# Patient Record
Sex: Female | Born: 1955 | Race: Black or African American | Hispanic: No | Smoking: Never smoker
Health system: Southern US, Community
[De-identification: ages and names within clinical notes are randomized; demographics above are authoritative.]

## PROBLEM LIST (undated history)

## (undated) DIAGNOSIS — E785 Hyperlipidemia, unspecified: Secondary | ICD-10-CM

## (undated) DIAGNOSIS — I10 Essential (primary) hypertension: Secondary | ICD-10-CM

## (undated) DIAGNOSIS — F32A Depression, unspecified: Secondary | ICD-10-CM

## (undated) DIAGNOSIS — G309 Alzheimer's disease, unspecified: Principal | ICD-10-CM

## (undated) DIAGNOSIS — R569 Unspecified convulsions: Secondary | ICD-10-CM

## (undated) DIAGNOSIS — D649 Anemia, unspecified: Secondary | ICD-10-CM

## (undated) DIAGNOSIS — E440 Moderate protein-calorie malnutrition: Secondary | ICD-10-CM

## (undated) DIAGNOSIS — S42301A Unspecified fracture of shaft of humerus, right arm, initial encounter for closed fracture: Secondary | ICD-10-CM

## (undated) DIAGNOSIS — F0390 Unspecified dementia without behavioral disturbance: Secondary | ICD-10-CM

## (undated) DIAGNOSIS — F0280 Dementia in other diseases classified elsewhere without behavioral disturbance: Secondary | ICD-10-CM

## (undated) DIAGNOSIS — N183 Chronic kidney disease, stage 3 (moderate): Secondary | ICD-10-CM

## (undated) DIAGNOSIS — D696 Thrombocytopenia, unspecified: Secondary | ICD-10-CM

## (undated) DIAGNOSIS — R4701 Aphasia: Secondary | ICD-10-CM

## (undated) DIAGNOSIS — F329 Major depressive disorder, single episode, unspecified: Secondary | ICD-10-CM

## (undated) HISTORY — PX: DILATION AND CURETTAGE OF UTERUS: SHX78

## (undated) HISTORY — DX: Unspecified convulsions: R56.9

## (undated) HISTORY — PX: VAGINAL HYSTERECTOMY: SUR661

## (undated) HISTORY — DX: Dementia in other diseases classified elsewhere without behavioral disturbance: F02.80

## (undated) HISTORY — DX: Alzheimer's disease, unspecified: G30.9

---

## 1997-10-18 ENCOUNTER — Ambulatory Visit (HOSPITAL_COMMUNITY): Admission: RE | Admit: 1997-10-18 | Discharge: 1997-10-18 | Payer: Self-pay | Admitting: Obstetrics and Gynecology

## 1998-08-29 ENCOUNTER — Encounter: Payer: Self-pay | Admitting: Obstetrics and Gynecology

## 1998-08-29 ENCOUNTER — Ambulatory Visit (HOSPITAL_COMMUNITY): Admission: RE | Admit: 1998-08-29 | Discharge: 1998-08-29 | Payer: Self-pay | Admitting: Obstetrics and Gynecology

## 1998-08-29 ENCOUNTER — Other Ambulatory Visit: Admission: RE | Admit: 1998-08-29 | Discharge: 1998-08-29 | Payer: Self-pay | Admitting: Obstetrics and Gynecology

## 1999-04-13 ENCOUNTER — Encounter: Payer: Self-pay | Admitting: Emergency Medicine

## 1999-04-13 ENCOUNTER — Emergency Department (HOSPITAL_COMMUNITY): Admission: EM | Admit: 1999-04-13 | Discharge: 1999-04-13 | Payer: Self-pay | Admitting: Emergency Medicine

## 1999-09-11 ENCOUNTER — Other Ambulatory Visit: Admission: RE | Admit: 1999-09-11 | Discharge: 1999-09-11 | Payer: Self-pay | Admitting: Obstetrics & Gynecology

## 1999-09-11 ENCOUNTER — Encounter: Payer: Self-pay | Admitting: Obstetrics and Gynecology

## 1999-09-11 ENCOUNTER — Ambulatory Visit (HOSPITAL_COMMUNITY): Admission: RE | Admit: 1999-09-11 | Discharge: 1999-09-11 | Payer: Self-pay | Admitting: Obstetrics and Gynecology

## 1999-09-21 ENCOUNTER — Encounter: Payer: Self-pay | Admitting: Obstetrics & Gynecology

## 1999-09-21 ENCOUNTER — Ambulatory Visit (HOSPITAL_COMMUNITY): Admission: RE | Admit: 1999-09-21 | Discharge: 1999-09-21 | Payer: Self-pay | Admitting: Psychology

## 1999-12-12 ENCOUNTER — Other Ambulatory Visit: Admission: RE | Admit: 1999-12-12 | Discharge: 1999-12-12 | Payer: Self-pay | Admitting: *Deleted

## 1999-12-12 ENCOUNTER — Encounter (INDEPENDENT_AMBULATORY_CARE_PROVIDER_SITE_OTHER): Payer: Self-pay

## 2000-01-11 ENCOUNTER — Ambulatory Visit (HOSPITAL_COMMUNITY): Admission: RE | Admit: 2000-01-11 | Discharge: 2000-01-11 | Payer: Self-pay | Admitting: Infectious Diseases

## 2000-01-11 ENCOUNTER — Encounter: Payer: Self-pay | Admitting: Infectious Diseases

## 2000-02-01 ENCOUNTER — Encounter (INDEPENDENT_AMBULATORY_CARE_PROVIDER_SITE_OTHER): Payer: Self-pay

## 2000-02-01 ENCOUNTER — Inpatient Hospital Stay (HOSPITAL_COMMUNITY): Admission: RE | Admit: 2000-02-01 | Discharge: 2000-02-04 | Payer: Self-pay | Admitting: Obstetrics & Gynecology

## 2000-09-13 ENCOUNTER — Encounter: Payer: Self-pay | Admitting: Obstetrics and Gynecology

## 2000-09-13 ENCOUNTER — Ambulatory Visit (HOSPITAL_COMMUNITY): Admission: RE | Admit: 2000-09-13 | Discharge: 2000-09-13 | Payer: Self-pay | Admitting: Obstetrics and Gynecology

## 2000-09-13 ENCOUNTER — Other Ambulatory Visit: Admission: RE | Admit: 2000-09-13 | Discharge: 2000-09-13 | Payer: Self-pay | Admitting: Obstetrics and Gynecology

## 2001-09-15 ENCOUNTER — Encounter: Payer: Self-pay | Admitting: Obstetrics and Gynecology

## 2001-09-15 ENCOUNTER — Ambulatory Visit (HOSPITAL_COMMUNITY): Admission: RE | Admit: 2001-09-15 | Discharge: 2001-09-15 | Payer: Self-pay | Admitting: Obstetrics and Gynecology

## 2001-09-15 ENCOUNTER — Other Ambulatory Visit: Admission: RE | Admit: 2001-09-15 | Discharge: 2001-09-15 | Payer: Self-pay | Admitting: Obstetrics and Gynecology

## 2002-09-16 ENCOUNTER — Other Ambulatory Visit: Admission: RE | Admit: 2002-09-16 | Discharge: 2002-09-16 | Payer: Self-pay | Admitting: Obstetrics and Gynecology

## 2002-09-17 ENCOUNTER — Ambulatory Visit (HOSPITAL_COMMUNITY): Admission: RE | Admit: 2002-09-17 | Discharge: 2002-09-17 | Payer: Self-pay | Admitting: Obstetrics and Gynecology

## 2002-09-17 ENCOUNTER — Encounter: Payer: Self-pay | Admitting: Obstetrics and Gynecology

## 2002-12-24 ENCOUNTER — Emergency Department (HOSPITAL_COMMUNITY): Admission: EM | Admit: 2002-12-24 | Discharge: 2002-12-24 | Payer: Self-pay | Admitting: *Deleted

## 2002-12-24 ENCOUNTER — Encounter: Payer: Self-pay | Admitting: *Deleted

## 2003-09-20 ENCOUNTER — Other Ambulatory Visit: Admission: RE | Admit: 2003-09-20 | Discharge: 2003-09-20 | Payer: Self-pay | Admitting: Obstetrics and Gynecology

## 2003-09-20 ENCOUNTER — Ambulatory Visit (HOSPITAL_COMMUNITY): Admission: RE | Admit: 2003-09-20 | Discharge: 2003-09-20 | Payer: Self-pay | Admitting: Obstetrics and Gynecology

## 2003-10-14 ENCOUNTER — Ambulatory Visit (HOSPITAL_COMMUNITY): Admission: RE | Admit: 2003-10-14 | Discharge: 2003-10-14 | Payer: Self-pay | Admitting: Obstetrics and Gynecology

## 2003-10-14 ENCOUNTER — Encounter (INDEPENDENT_AMBULATORY_CARE_PROVIDER_SITE_OTHER): Payer: Self-pay | Admitting: *Deleted

## 2004-03-06 ENCOUNTER — Emergency Department (HOSPITAL_COMMUNITY): Admission: EM | Admit: 2004-03-06 | Discharge: 2004-03-06 | Payer: Self-pay

## 2004-05-31 ENCOUNTER — Other Ambulatory Visit: Admission: RE | Admit: 2004-05-31 | Discharge: 2004-05-31 | Payer: Self-pay | Admitting: Obstetrics and Gynecology

## 2004-09-21 ENCOUNTER — Ambulatory Visit (HOSPITAL_COMMUNITY): Admission: RE | Admit: 2004-09-21 | Discharge: 2004-09-21 | Payer: Self-pay | Admitting: Obstetrics and Gynecology

## 2004-11-29 ENCOUNTER — Other Ambulatory Visit: Admission: RE | Admit: 2004-11-29 | Discharge: 2004-11-29 | Payer: Self-pay | Admitting: Obstetrics and Gynecology

## 2005-06-28 ENCOUNTER — Other Ambulatory Visit: Admission: RE | Admit: 2005-06-28 | Discharge: 2005-06-28 | Payer: Self-pay | Admitting: Obstetrics and Gynecology

## 2005-11-16 ENCOUNTER — Ambulatory Visit (HOSPITAL_COMMUNITY): Admission: RE | Admit: 2005-11-16 | Discharge: 2005-11-16 | Payer: Self-pay | Admitting: Obstetrics and Gynecology

## 2005-12-10 ENCOUNTER — Ambulatory Visit (HOSPITAL_COMMUNITY): Admission: RE | Admit: 2005-12-10 | Discharge: 2005-12-10 | Payer: Self-pay | Admitting: Gastroenterology

## 2005-12-10 ENCOUNTER — Encounter (INDEPENDENT_AMBULATORY_CARE_PROVIDER_SITE_OTHER): Payer: Self-pay | Admitting: *Deleted

## 2006-01-17 ENCOUNTER — Other Ambulatory Visit: Admission: RE | Admit: 2006-01-17 | Discharge: 2006-01-17 | Payer: Self-pay | Admitting: Obstetrics and Gynecology

## 2007-12-31 ENCOUNTER — Ambulatory Visit (HOSPITAL_COMMUNITY): Admission: RE | Admit: 2007-12-31 | Discharge: 2007-12-31 | Payer: Self-pay | Admitting: Family Medicine

## 2008-01-02 ENCOUNTER — Ambulatory Visit (HOSPITAL_COMMUNITY): Admission: RE | Admit: 2008-01-02 | Discharge: 2008-01-02 | Payer: Self-pay | Admitting: Obstetrics and Gynecology

## 2008-02-27 ENCOUNTER — Ambulatory Visit (HOSPITAL_COMMUNITY): Admission: RE | Admit: 2008-02-27 | Discharge: 2008-02-27 | Payer: Self-pay | Admitting: Neurology

## 2008-04-19 ENCOUNTER — Encounter: Admission: RE | Admit: 2008-04-19 | Discharge: 2008-05-25 | Payer: Self-pay | Admitting: Neurology

## 2008-09-09 ENCOUNTER — Encounter: Admission: RE | Admit: 2008-09-09 | Discharge: 2008-09-09 | Payer: Self-pay | Admitting: Neurology

## 2008-11-03 ENCOUNTER — Ambulatory Visit (HOSPITAL_COMMUNITY): Admission: RE | Admit: 2008-11-03 | Discharge: 2008-11-03 | Payer: Self-pay | Admitting: Psychiatry

## 2008-11-17 ENCOUNTER — Ambulatory Visit (HOSPITAL_COMMUNITY): Admission: RE | Admit: 2008-11-17 | Discharge: 2008-11-17 | Payer: Self-pay | Admitting: Neurology

## 2009-07-05 ENCOUNTER — Ambulatory Visit (HOSPITAL_COMMUNITY): Admission: RE | Admit: 2009-07-05 | Discharge: 2009-07-05 | Payer: Self-pay | Admitting: Gastroenterology

## 2010-06-18 ENCOUNTER — Encounter: Payer: Self-pay | Admitting: Obstetrics and Gynecology

## 2010-06-18 ENCOUNTER — Encounter: Payer: Self-pay | Admitting: Endocrinology

## 2010-09-04 LAB — GLUCOSE, CAPILLARY: Glucose-Capillary: 124 mg/dL — ABNORMAL HIGH (ref 70–99)

## 2010-09-04 LAB — CREATININE, SERUM
Creatinine, Ser: 1.23 mg/dL — ABNORMAL HIGH (ref 0.4–1.2)
GFR calc non Af Amer: 46 mL/min — ABNORMAL LOW

## 2010-09-04 LAB — BUN: BUN: 13 mg/dL (ref 6–23)

## 2010-10-13 NOTE — H&P (Signed)
NAME:  Alexandria Lowe, Alexandria Lowe                           ACCOUNT NO.:  [id number]   MEDICAL RECORD NO.:  [id number]                   PATIENT TYPE:  AMB   LOCATION:  SDC                                  FACILITY:  [facility]   PHYSICIAN:  Janine Limbo, M.D.            DATE OF BIRTH:  1955/11/10   DATE OF ADMISSION:  10/14/2003  DATE OF DISCHARGE:                                HISTORY & PHYSICAL   HISTORY OF PRESENT ILLNESS:  Alexandria Lowe is a 55 year old female, para 1-0-2-1,  who presents with a Pap smear from April of 2005 that showed a high-grade  squamous intraepithelial lesion.  Colposcopically directed biopsies were  performed in May of 2005 and the patient had a transition zone that could  not be completely identified.  A biopsy of the exocervix showed squamous  metaplasia only.  The patient had a supracervical hysterectomy performed in  2001 because of fibroids, menorrhagia, and anemia.  The patient had personal  reasons for wanting to keep her cervix.  The patient had a dilatation and  curettage for a miscarriage in 1974.   DRUG ALLERGIES:  No known drug allergies.   PAST MEDICAL HISTORY:  The patient has had a positive PPD with a negative  chest x-ray.  The patient has a history of hypertension and obesity.   SOCIAL HISTORY:  The patient denies cigarette use, alcohol use, and  recreational drug use.   REVIEW OF SYSTEMS:  Noncontributory.   FAMILY HISTORY:  The patient's mother had lung cancer, heart disease, and  diabetes.  She has a sister with hypothyroidism and hypertension.  She has a  brother with diabetes.   PHYSICAL EXAMINATION:  WEIGHT:  250 pounds.  HEIGHT:  5 feet 3 inches.  HEENT:  Within normal limits.  CHEST:  Clear.  HEART:  Regular rate and rhythm.  BREASTS:  Without masses.  ABDOMEN:  Nontender.  EXTREMITIES:  Grossly normal.  NEUROLOGIC:  Grossly normal.  PELVIC:  External genitalia are normal.  The vagina is normal.  The cervix  is nontender.  The uterus is  surgically absent.  Adnexa:  No masses are  appreciated.  Rectovaginal exam confirms.   ASSESSMENT:  1. High-grade squamous intraepithelial lesion on Pap smear with an     inadequate colposcopy because the transition zone could not be completely     seen.  2. The patient is status post supracervical hysterectomy.  3. Hypertension.  4. Obesity.   PLAN:  The patient will undergo a cold knife conization of the cervix.  She  understands the indications for her procedure and she accepts the risks of,  but not limited to anesthetic complications, bleeding, infections, and  possible damage to the surrounding organs.  Janine Limbo, M.D.    AVS/MEDQ  D:  10/10/2003  T:  10/10/2003  Job:  [id number]

## 2010-10-13 NOTE — Op Note (Signed)
NAME:  Alexandria Lowe, Alexandria Lowe                           ACCOUNT NO.:  [id number]   MEDICAL RECORD NO.:  [id number]                   PATIENT TYPE:  AMB   LOCATION:  SDC                                  FACILITY:  [facility]   PHYSICIAN:  Janine Limbo, M.D.            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   PROCEDURE:  Cold knife conization of the cervix.   SURGEON:  Janine Limbo, M.D.   ANESTHESIA:  Monitored anesthetic control, paracervical block using 0.5%  Marcaine with epinephrine.   DISPOSITION:  [name] is a 55 year old female who had a Pap smear that  showed a high grade lesion (cervical intraepithelial neoplasia II).  The  patient has had a supracervical hysterectomy in the past.  Colposcopy was  performed and the transition zone could not be completely seen.  Biopsies  were obtained but no pathology was found.  The patient understands the  indications for her conization of the cervix. She accepts the risk of, but  not limited to, anesthetic complications, bleeding, infections and possible  damage to the surrounding organs.   FINDINGS:  Lugol solution was placed on the cervix and there were no  nonstaining areas.  The vagina appeared normal.   DESCRIPTION OF PROCEDURE:  The patient was taken to the operating room where  she was given medication throughout her IV line.  She was placed in a  lithotomy position. The perineum and vagina were prepped with multiple  layers of Betadine. The bladder was drained of urine. The patient was  sterilely draped.  Examination under anesthesia was performed.  Lugol  solution was placed on the cervix and there were no nonstaining  areas noted.  Figure-of-eight sutures were placed at the 3 o'clock and the 9 o'clock  position on the cervix. The cervix was then injected with a diluted solution  of 0.5% Marcaine with epinephrine.  The endocervix was sounded.  A  circumferential incision was made around the endocervix and the incision was  extended in a cone like fashion toward the endocervical os.  The specimen  was opened at 9 o'clock and sent to pathology.  Hemostasis was achieved  using inverting sutures.  The inverting sutures were placed at the 12  o'clock, 3 o'clock, 6 o'clock and 9 o'clock positions.  Hemostasis was  thought to be adequate.  Her exam was repeated and the cervix was noted to  be normal. The patient was returned to the supine position. She was taken to  the recovery room in stable condition. #0 Vicryl was the suture material  used throughout the procedure.   FOLLOWUP INSTRUCTIONS:  The patient was given a prescription for Vicodin and  she will take 1 or 2 tablets every 4 hours as needed for pain.  She will  return to see Dr. Stefano Gaul in 2-3 weeks for followup examination.  She  was given a copy of the postoperative instruction sheet as prepared by the  [facility] for patients who have undergone the  dilatation and curettage. That instruction sheet was modified for a  conization of the cervix.                                               Janine Limbo, M.D.    AVS/MEDQ  D:  10/14/2003  T:  10/14/2003  Job:  [id number]

## 2010-10-13 NOTE — Op Note (Signed)
[facility]  Patient:    Alexandria Lowe, Alexandria Lowe                        MRN: [id number] Proc. Date: 02/01/00 Adm. Date:  [id number] Attending:  Cleatrice Burke                           Operative Report  PREOPERATIVE DIAGNOSES:       1. Menorrhagia.                               2. Fibroid uterus.                               3. Anemia.                               4. Desires to preserve the cervix.  POSTOPERATIVE DIAGNOSES:      1. Menorrhagia.                               2. Fibroid uterus.                               3. Anemia.                               4. Desires to preserve the cervix.  OPERATION:                    Abdominal supracervical hysterectomy.  SURGEON:                      Cecilio Asper, M.D.  ASSISTANT:                    Henreitta Leber, P.A.C.  ANESTHESIA:                   General anesthesia.  ESTIMATED BLOOD LOSS:         850 cc.  URINE OUTPUT:                 300 cc.  FINDINGS:                     Multiple fibroids within the uterus.  Normal                               tubes and ovaries.  COMPLICATIONS:                None.  INDICATIONS:                  The patient is a 55 year old gravida 3, para 1-0-2-3 who has a history of heavy menstrual periods.  The patient has known fibroid uterus and has been taking iron for iron-deficiency anemia.  The patient declined uterine artery embolization and desired definitive therapy. The patient desires to keep her cervix and therefore is consented for supracervical abdominal hysterectomy.  DESCRIPTION OF PROCEDURE:  After adequate level of general anesthesia was obtained, the patient was prepped in sterile fashion.  Foley catheter was placed inside the bladder to drain the urine and the patient was subsequently draped. A low transverse incision was made down through subcutaneous fat to the fascia. Fascia was incised on either side of the midline. This  incision was carried laterally in both directions with the Mayo scissors.  Kocher clamps were placed on the superior edge of the fascia and the rectus muscles were removed from the fascia both superiorly and inferiorly.  Subcuticular bleeders were made hemostatic with the Bovie.  Parietal peritoneum was identified and elevated with hemostats.  It was incised and the incision was carried superiorly and inferiorly with the Metzenbaum scissors.  The OConnor-OSullivan retractor was placed.  Bladder piece was placed. The bowel was packed and the patient was placed in Trendelenburg position.  Kelly clamps were placed on the utero-ovarian pedicles bilaterally. The round ligament was grasped, suture ligated and transected.  A window within the broad ligament was made and the [facility] were placed in order to clamp the utero-ovarian pedicles. This pedicle was then cut and made hemostatic with a free tie ligature and a suture ligature bilaterally. It was difficult to develop the bladder flap because there was an anterior fibroid noted.  This fibroid was injected with a dilute solution of Pitressin.  With sharp and blunt dissection as well as a towel clip, this large fibroid was removed.  Subsequently the bladder flap could be identified and was developed in normal fashion.  The uterine arteries were then crossclamped, cut and suture ligated bilaterally. The fundus of the uterus superior to the myomectomy was then removed.  It was noted that there were still a few branches of the uterine arteries that were identifiable. These were crossclamped, cut, and suture ligated.  Hemostasis at that point was assured.  The rest of the uterus was removed above the uterosacral cardinal ligament complex. The stump of the cervix was oversewn. A few bleeders on the infundibulopelvic ligament was made hemostatic with a figure-of-eight of 0 Vicryl. There was some bleeding from the bladder flap that was made hemostatic  with the Bovie. The pelvis was then copiously irrigated.  Hemostasis was assured. Packs were then removed.  Round ligament pedicles were cut.  OConnor-OSullivan retractor was then removed.  Parietal peritoneum was grasped with Kelly clamps and reapproximated with a running suture of 3-0 Vicryl.  Subfascial bleeders were made hemostatic with the Bovie.  The fascia was then reapproximated from the lateral edge to the midline with a running suture of #1 Vicryl.  Subcuticular layer was irrigated. Skin was closed with skin staples.  Pressure bandage was applied.  The patient tolerated the procedure well. She was taken to the recovery room in stable condition. DD:  02/01/00 TD:  02/02/00 Job: [id number]

## 2010-10-13 NOTE — Discharge Summary (Signed)
[facility]  Patient:    Alexandria Lowe, Alexandria Lowe                        MRN: [id number] Adm. Date:  [id number] Disc. Date: [id number] Attending:  Cleatrice Burke Dictator:   Marquis Lunch. Lowell Guitar, PA-C                           Discharge Summary  DISCHARGE DIAGNOSES:          1. Menorrhagia.                               2. Fibroid uterus.                               3. Anemia.                               4. Desire to preserve cervix.  PROCEDURE:                    On date of admission patient underwent an abdominal supracervical hysterectomy.  HISTORY OF PRESENT ILLNESS:   This is a 55 year old African-American female gravida 3, para 1-0-2-3 with a history of uterine fibroids and very heavy menstrual periods which have caused significant anemia.  Patient presents for definitive therapy of her symptoms in the form of a supracervical hysterectomy.  Please see patients dictated history and physical examination for details.  PHYSICAL EXAMINATION:  VITAL SIGNS:                  Blood pressure 120/80.  Weight 249 pounds.  GENERAL:                      Within normal limits.  PELVIC:                       EGBUS reveals a normal female genitalia.  Vagina is without discharge.  Cervix without gross lesions.  Uterus is upper limits of normal, however, difficult to assess secondary to body habitus.  Adnexa also limited by body habitus, however, no palpable masses.  Rectovaginal was deferred.  HOSPITAL COURSE:              On date of admission patient underwent an abdominal supracervical hysterectomy, tolerating procedure well.  Her postoperative course was unremarkable with patient quickly tolerating a regular diet and deemed ready for discharge on postoperative day #3. Postoperative hemoglobin 8.3.  Preoperative hemoglobin 11.3.  DISCHARGE MEDICATIONS:        1. Darvocet-N 100 one to two tablets every three                                  to four hours as  needed for pain.                               2. Ibuprofen 600 mg every six hours as needed.  3. Iron one tablet t.i.d.  FOLLOW-UP:                    Patient is scheduled to present to [facility] February 08, 2000 for staple removal.  She is at that time to schedule a six week postoperative appointment with Dr. Cleatrice Burke.  DISCHARGE INSTRUCTIONS:       Patient was given a copy of [facility] postoperative instruction sheet.  She was further advised to avoid driving for two weeks, heavy lifting for four weeks, and intercourse for six weeks.  FINAL PATHOLOGY:              Uterus:  Benign secretory endometrium and leiomyomata uteri submucosal, intramural, and subserosal. DD:  03/06/00 TD:  03/07/00 Job: [id number]

## 2010-10-13 NOTE — Op Note (Signed)
Alexandria Lowe, Alexandria Lowe                 ACCOUNT NO.:  [id number]   MEDICAL RECORD NO.:  [id number]          PATIENT TYPE:  AMB   LOCATION:  ENDO                         FACILITY:  [facility]   PHYSICIAN:  Bernette Redbird, M.D.   DATE OF BIRTH:  1956-04-05   DATE OF PROCEDURE:  12/10/2005  DATE OF DISCHARGE:                                 OPERATIVE REPORT   PROCEDURE:  Colonoscopy with polypectomy.   INDICATIONS:  A 55 year old female for initial colon cancer screening  procedure.  No worrisome risk factors or symptoms, although in the past she  has had anemia.   FINDINGS:  A 1 cm semipedunculated polyp removed from the proximal ascending  colon.   DESCRIPTION OF PROCEDURE:  The nature, purpose, and risks of the procedure  had been discussed with the patient via our open access program through the  office and I reviewed the risks of the procedure with her.  She had provided  written consent after coming, as an outpatient, to the endoscopy unit.  Sedation:  Fentanyl 100 mcg and Versed 7.5 mg IV without arrhythmias or  desaturation.    The Olympus adult video colonoscope was advanced to the terminal ileum  without difficulty and pullback was then performed.  The terminal ileum had  normal appearance, and the appendiceal orifice was well identified.  A short  distance above the cecum was roughly a 1 cm semipedunculated polyp which was  snared off, using cautery, in two pieces.  Although the polyp, itself,  seemed somewhat friable and vascular, there was no evident bleeding  following the polypectomy.  The pieces were successfully suctioned through  the scope.   No other polyps were seen on this exam and there was no evidence of cancer,  colitis or vascular malformations.  A solitary right side small diverticulum  was seen during the exam.  Retroflexion was attempted in the rectum, but  could not be accomplished due to a small rectal ampulla, but careful  antegrade viewing disclosed no  distal rectal lesions; and reinspection of  the rectum was unremarkable.  The patient tolerated the procedure well and  there no apparent complications.   IMPRESSION:  1.  Colon polyp removed as described above (211.3).  2.  Solitary right side diverticulum.   PLAN:  Await pathology results.           ______________________________  Bernette Redbird, M.D.     RB/MEDQ  D:  12/10/2005  T:  12/10/2005  Job:  [id number]   cc:   Jethro Bastos, M.D.  Fax: [phone]

## 2013-03-04 ENCOUNTER — Telehealth: Payer: Self-pay | Admitting: Neurology

## 2013-03-06 MED ORDER — MEMANTINE HCL 10 MG PO TABS: 10.0000 mg | ORAL_TABLET | Freq: Two times a day (BID) | ORAL | Status: DC

## 2013-03-06 MED ORDER — DONEPEZIL HCL 10 MG PO TABS: 10.0000 mg | ORAL_TABLET | Freq: Every day | ORAL | Status: DC

## 2013-03-06 MED ORDER — SERTRALINE HCL 50 MG PO TABS: 50.0000 mg | ORAL_TABLET | Freq: Every day | ORAL | Status: DC

## 2013-03-06 NOTE — Telephone Encounter (Signed)
Spoke to patient's son Cristal Deer. Patient is a formed Dr. Sandria Manly patient. Needs reassignment and refills. She is currently out of Aricept 10mg , Sertraline 50mg , and Namenda 10mg . Requesting to have refills called in today until patient can be transferred/scheduled to another MD in practice.

## 2013-03-10 ENCOUNTER — Emergency Department (HOSPITAL_COMMUNITY): Payer: Medicare HMO

## 2013-03-10 ENCOUNTER — Observation Stay (HOSPITAL_COMMUNITY)
Admission: EM | Admit: 2013-03-10 | Discharge: 2013-03-11 | Disposition: A | Discharge status: Home / Self Care | Payer: Medicare HMO | Attending: Family Medicine | Admitting: Family Medicine

## 2013-03-10 ENCOUNTER — Encounter (HOSPITAL_COMMUNITY): Payer: Self-pay | Admitting: Emergency Medicine

## 2013-03-10 DIAGNOSIS — I951 Orthostatic hypotension: Secondary | ICD-10-CM | POA: Insufficient documentation

## 2013-03-10 DIAGNOSIS — Z79899 Other long term (current) drug therapy: Secondary | ICD-10-CM | POA: Insufficient documentation

## 2013-03-10 DIAGNOSIS — R55 Syncope and collapse: Principal | ICD-10-CM | POA: Diagnosis present

## 2013-03-10 DIAGNOSIS — F0390 Unspecified dementia without behavioral disturbance: Secondary | ICD-10-CM | POA: Insufficient documentation

## 2013-03-10 DIAGNOSIS — R42 Dizziness and giddiness: Secondary | ICD-10-CM | POA: Insufficient documentation

## 2013-03-10 DIAGNOSIS — R9431 Abnormal electrocardiogram [ECG] [EKG]: Secondary | ICD-10-CM

## 2013-03-10 DIAGNOSIS — I4581 Long QT syndrome: Secondary | ICD-10-CM | POA: Insufficient documentation

## 2013-03-10 DIAGNOSIS — R011 Cardiac murmur, unspecified: Secondary | ICD-10-CM | POA: Insufficient documentation

## 2013-03-10 DIAGNOSIS — E876 Hypokalemia: Secondary | ICD-10-CM | POA: Diagnosis present

## 2013-03-10 DIAGNOSIS — Z23 Encounter for immunization: Secondary | ICD-10-CM | POA: Insufficient documentation

## 2013-03-10 DIAGNOSIS — I10 Essential (primary) hypertension: Secondary | ICD-10-CM | POA: Insufficient documentation

## 2013-03-10 DIAGNOSIS — I160 Hypertensive urgency: Secondary | ICD-10-CM | POA: Diagnosis present

## 2013-03-10 HISTORY — DX: Unspecified dementia, unspecified severity, without behavioral disturbance, psychotic disturbance, mood disturbance, and anxiety: F03.90

## 2013-03-10 HISTORY — DX: Essential (primary) hypertension: I10

## 2013-03-10 LAB — URINALYSIS, ROUTINE W REFLEX MICROSCOPIC
Bilirubin Urine: NEGATIVE
Glucose, UA: NEGATIVE mg/dL
Ketones, ur: 15 mg/dL — AB
Protein, ur: 30 mg/dL — AB
Specific Gravity, Urine: 1.022 (ref 1.005–1.030)
pH: 6.5 (ref 5.0–8.0)

## 2013-03-10 LAB — COMPREHENSIVE METABOLIC PANEL
ALT: 41 U/L — ABNORMAL HIGH (ref 0–35)
BUN: 11 mg/dL (ref 6–23)
CO2: 28 mEq/L (ref 19–32)
Calcium: 9.1 mg/dL (ref 8.4–10.5)
Chloride: 104 mEq/L (ref 96–112)
Creatinine, Ser: 1.15 mg/dL — ABNORMAL HIGH (ref 0.50–1.10)
GFR calc Af Amer: 60 mL/min — ABNORMAL LOW (ref >90)
GFR calc non Af Amer: 51 mL/min — ABNORMAL LOW (ref >90)
Glucose, Bld: 158 mg/dL — ABNORMAL HIGH (ref 70–99)
Total Bilirubin: 0.5 mg/dL (ref 0.3–1.2)

## 2013-03-10 LAB — CBC
Hemoglobin: 13.6 g/dL (ref 12.0–15.0)
MCH: 27.2 pg (ref 26.0–34.0)
MCHC: 34 g/dL (ref 30.0–36.0)
MCV: 80 fL (ref 78.0–100.0)
RBC: 5 MIL/uL (ref 3.87–5.11)
RDW: 15.8 % — ABNORMAL HIGH (ref 11.5–15.5)

## 2013-03-10 LAB — URINE MICROSCOPIC-ADD ON

## 2013-03-10 LAB — GLUCOSE, CAPILLARY: Glucose-Capillary: 145 mg/dL — ABNORMAL HIGH (ref 70–99)

## 2013-03-10 MED ORDER — HEPARIN SODIUM (PORCINE) 5000 UNIT/ML IJ SOLN
5000.0000 [IU] | Freq: Three times a day (TID) | INTRAMUSCULAR | Status: DC
  Administered 2013-03-10 – 2013-03-11 (×2): 5000 [IU] via SUBCUTANEOUS
  Filled 2013-03-10 (×5): qty 1

## 2013-03-10 MED ORDER — SODIUM CHLORIDE 0.9 % IV SOLN
INTRAVENOUS | Status: AC
  Administered 2013-03-10: 23:00:00 via INTRAVENOUS

## 2013-03-10 MED ORDER — AMLODIPINE BESYLATE 10 MG PO TABS
10.0000 mg | ORAL_TABLET | Freq: Every day | ORAL | Status: DC
  Administered 2013-03-10 – 2013-03-11 (×2): 10 mg via ORAL
  Filled 2013-03-10 (×2): qty 1

## 2013-03-10 MED ORDER — HYDRALAZINE HCL 20 MG/ML IJ SOLN
5.0000 mg | INTRAMUSCULAR | Status: DC | PRN
  Administered 2013-03-11: 5 mg via INTRAVENOUS
  Filled 2013-03-10: qty 1

## 2013-03-10 MED ORDER — SERTRALINE HCL 50 MG PO TABS
50.0000 mg | ORAL_TABLET | Freq: Every day | ORAL | Status: DC
Start: 2013-03-11 — End: 2013-03-12
  Administered 2013-03-11: 50 mg via ORAL
  Filled 2013-03-10: qty 1

## 2013-03-10 MED ORDER — SODIUM CHLORIDE 0.9 % IJ SOLN
3.0000 mL | Freq: Two times a day (BID) | INTRAMUSCULAR | Status: DC
  Administered 2013-03-10: 3 mL via INTRAVENOUS

## 2013-03-10 MED ORDER — MEMANTINE HCL 10 MG PO TABS
10.0000 mg | ORAL_TABLET | Freq: Two times a day (BID) | ORAL | Status: DC
  Administered 2013-03-10 – 2013-03-11 (×2): 10 mg via ORAL
  Filled 2013-03-10 (×3): qty 1

## 2013-03-10 MED ORDER — CLONIDINE HCL 0.1 MG PO TABS
0.1000 mg | ORAL_TABLET | Freq: Once | ORAL | Status: AC
  Administered 2013-03-10: 0.1 mg via ORAL
  Filled 2013-03-10: qty 1

## 2013-03-10 MED ORDER — POTASSIUM CHLORIDE CRYS ER 20 MEQ PO TBCR
40.0000 meq | EXTENDED_RELEASE_TABLET | Freq: Once | ORAL | Status: AC
  Administered 2013-03-10: 40 meq via ORAL
  Filled 2013-03-10: qty 2

## 2013-03-10 MED ORDER — DONEPEZIL HCL 10 MG PO TABS
10.0000 mg | ORAL_TABLET | Freq: Every day | ORAL | Status: DC
  Administered 2013-03-10: 10 mg via ORAL
  Filled 2013-03-10 (×2): qty 1

## 2013-03-10 MED ORDER — POTASSIUM CHLORIDE 10 MEQ/100ML IV SOLN
10.0000 meq | Freq: Once | INTRAVENOUS | Status: AC
  Administered 2013-03-10: 10 meq via INTRAVENOUS
  Filled 2013-03-10: qty 100

## 2013-03-10 NOTE — ED Notes (Signed)
MD at bedside. 

## 2013-03-10 NOTE — ED Notes (Signed)
Patient transported to X-ray 

## 2013-03-10 NOTE — ED Notes (Signed)
Pt found outside of ED. Returned to room. Safety zone completed. Pt no voiced complaints. Pt smiling, laughing, denies pain. NAD. Sitter requested by charge nurse

## 2013-03-10 NOTE — ED Provider Notes (Signed)
7:18 PM Have spoken to family medicine who will admit for further w/u of near syncopal episode of QT prolongation and uncontrolled hypertension.   1. Near syncope   2. Hypertension   3. Orthostatic hypotension   4. Hypokalemia   5. Prolonged Q-T interval on ECG      Shanna Cisco, MD 03/10/13 1918

## 2013-03-10 NOTE — ED Notes (Signed)
Pt noted to be gone from room. Gown found on bed. Press photographer, AD, security & [employer] informed & aware.

## 2013-03-10 NOTE — ED Notes (Addendum)
Family reports pt c/o dizziness, lightheadedness prior to near syncopal episode. Pt denies presently.  Denies CP, palpitations, SOB, h/a, n/v/d. Pt oriented to self only. Has history of dementia & alzheimers.  Unable to answer what month, year or her birthday.  Family reports mental status is normal for her.  Family states pt has been eating & drinking normal. Ran out of BP meds 3 days ago

## 2013-03-10 NOTE — Progress Notes (Signed)
Unit CM UR Completed by [facility] ED CM  W. Jinan Biggins RN  

## 2013-03-10 NOTE — ED Notes (Signed)
Spoke with Dr. Leotis Pain

## 2013-03-10 NOTE — ED Notes (Signed)
Pt seen standing beside stretcher, stating "I have to go home". Pt redirected back to bed, reoriented to place & situation. Denies any pain. Medicated for high BP, will monitor

## 2013-03-10 NOTE — ED Provider Notes (Signed)
CSN: [id number]     Arrival date & time 03/10/13  1331 History   First MD Initiated Contact with Patient 03/10/13 1332     Chief Complaint  Patient presents with  . Loss of Consciousness   (Consider location/radiation/quality/duration/timing/severity/associated sxs/prior Treatment) HPI Comments: 57 year old female the past medical history of hypertension and dementia presents to the emergency department via EMS after a near syncopal episode while standing at a bank. Patient was with her son who she lives with, son noticed that she looked like she was going to faint, caught her before she could fall, did not quite have a full syncopal episode. No loss of consciousness. Prior to the near-syncopal episode, she was complaining to her son of lightheadedness and dizziness. Currently she denies any symptoms. This morning she woke up and was feeling fine, went about her normal day, had a full breakfast. She is at her baseline mentation, alert to person, but not place or time. She has been out of her blood pressure medications for the past 3 days, her son is working on getting her refills. On EMS arrival, blood pressure 177/133, she was given 10 mg IV labetalol per Dr. Jeraldine Loots as they called in to the emergency department for approval. She was also given IV fluids, CBG 54, then given oral glucose. Positive orthostatic vital signs, drop in BP > 30 systolic. CBG upon arrival to ED 145.  The history is provided by the patient, a relative and the EMS personnel.    Past Medical History  Diagnosis Date  . Hypertension   . Dementia    History reviewed. No pertinent past surgical history. No family history on file. History  Substance Use Topics  . Smoking status: Not on file  . Smokeless tobacco: Not on file  . Alcohol Use: Not on file   OB History   Grav Para Term Preterm Abortions TAB SAB Ect Mult Living                 Review of Systems  Constitutional: Negative for activity change and appetite  change.  Eyes: Negative for visual disturbance.  Respiratory: Negative for shortness of breath.   Cardiovascular: Negative for chest pain and palpitations.  Gastrointestinal: Negative for nausea.  Neurological: Positive for dizziness (subsided) and light-headedness (subsided).       Positive for near syncope.  All other systems reviewed and are negative.    Allergies  Review of patient's allergies indicates no known allergies.  Home Medications   Current Outpatient Rx  Name  Route  Sig  Dispense  Refill  . donepezil (ARICEPT) 10 MG tablet   Oral   Take 1 tablet (10 mg total) by mouth at bedtime.   30 tablet   12   . memantine (NAMENDA) 10 MG tablet   Oral   Take 1 tablet (10 mg total) by mouth 2 (two) times daily.   60 tablet   12   . sertraline (ZOLOFT) 50 MG tablet   Oral   Take 1 tablet (50 mg total) by mouth daily.   30 tablet   12    BP 135/80  Pulse 59  Temp(Src) 97.8 F (36.6 C) (Oral)  Resp 14  SpO2 100% Physical Exam  Nursing note and vitals reviewed. Constitutional: She appears well-developed and well-nourished. No distress.  HENT:  Head: Normocephalic and atraumatic.  Mouth/Throat: Oropharynx is clear and moist.  Eyes: Conjunctivae and EOM are normal. Pupils are equal, round, and reactive to light.  Neck: Normal  range of motion. Neck supple.  Cardiovascular: Normal rate, regular rhythm and intact distal pulses.   Murmur heard.  Systolic (blowing) murmur is present with a grade of 2/6  Pulmonary/Chest: Effort normal and breath sounds normal.  Abdominal: Soft. Bowel sounds are normal. There is no tenderness.  Musculoskeletal: Normal range of motion. She exhibits no edema.  Neurological: She is alert. She has normal strength. No cranial nerve deficit or sensory deficit. Coordination normal. GCS eye subscore is 4. GCS verbal subscore is 5. GCS motor subscore is 6.  Oriented to person, not place or time.  Skin: Skin is warm and dry. She is not  diaphoretic.  Psychiatric: She has a normal mood and affect. Her speech is normal and behavior is normal.  Pleasant    ED Course  Procedures (including critical care time) Labs Review Labs Reviewed  GLUCOSE, CAPILLARY - Abnormal; Notable for the following:    Glucose-Capillary 145 (*)    All other components within normal limits  CBC - Abnormal; Notable for the following:    WBC 11.1 (*)    RDW 15.8 (*)    All other components within normal limits  URINALYSIS, ROUTINE W REFLEX MICROSCOPIC  COMPREHENSIVE METABOLIC PANEL   Imaging Review No results found.  EKG Interpretation   None      Date: 03/10/2013  Rate: 55  Rhythm: normal sinus rhythm  QRS Axis: normal  Intervals: QT prolonged  ST/T Wave abnormalities: normal  Conduction Disutrbances:none  Narrative Interpretation: no stemi, no old EKG  Old EKG Reviewed: none available    MDM   1. Near syncope   2. Hypertension   3. Orthostatic hypotension      Patient presenting after a near syncopal episode. She is well appearing and in no apparent distress. Blood pressure 135/80 at this time. CBG 145. At her baseline mentation per son. Orthostatic hypotension noted by EMS. Prolonged QT on EKG, no old to compare. Labs pending- CBC, CMP, urinalysis. Chest x-ray pending. Plan is to admit for near syncopal episode. Patient also evaluated by my attending Dr. Jeraldine Loots who agrees with plan of care. 3:58 PM BP increased to 244/85. 0.1mg  clonidine given. CMP, UA pending. Patient signed out to Dr. Micheline Maze at shift change.   Trevor Mace, PA-C 03/10/13 [phone]

## 2013-03-10 NOTE — ED Notes (Signed)
Pt had a near syncopal episode upon standing, witnessed by family. Per EMS - given labetalol 10mg  IVP for initial BP 177/133 & oral glucose for CBG 54.  Pt reports out of BP meds x 3 days. A&OX4 upon arrival to ED

## 2013-03-10 NOTE — ED Notes (Signed)
Alexandria Lowe (son) cell [phone]

## 2013-03-10 NOTE — H&P (Signed)
Family Medicine Teaching [facility] Admission History and Physical Service Pager: [phone]  Patient name: Alexandria Lowe Medical record number: [id number] Date of birth: November 18, 1955 Age: 57 y.o. Gender: female  Primary Care Provider: No primary provider on file. Consultants: None Code Status: Full Code  Chief Complaint: Near syncope  Assessment and Plan: Alexandria Lowe is a 57 y.o. female presenting with hypertensive urgency and near sycope.  # Near Syncope and Orthostatic Hypotension - Unclear etiology. DDx - Volume depletion, secondary to medication, aging, dysautonomia, arrythmia; this does not appear to be vaso-vagal in origin. - Tele monitoring - NS @ 75 mL hour - CBC, BMP, and Mag in the am. Repeat EKG in the am.  Troponin x 2.  - Consider carotid dopplers in the setting of carotid bruit and near-syncope. - Patient will need outpatient echo for evaluation of murmur.  # Hypertensive Emergency - BP markedly elevated in the ED; No evidence of end organ damage.  Patient has been out of her home Lisinopril for 4 days. - Will start patient on Norvasc 10 mg daily. - Holding Lisinopril in the setting of mildly elevated Creatinine. - Will monitor BP closely.   # Dementia - Continuing home medications - Aricept, Namenda - Also continuing home zoloft.   FEN/GI:  NS @ 75 Prophylaxis: Heparin for DVT PPx  Disposition: Pending further work up and tele monitoring.   History of Present Illness: Alexandria Lowe is a 57 y.o. female with HTN and Dementia presented to the ED via EMS after a near syncopal episode earlier today.    Patient was at the bank earlier today with her son and all of a sudden felt weak, fatigued, and lightheaded.  No associated chest pain, palpitations, or SOB.  She was noted to be diaphoretic.  She leaned on the counter in front of the bank teller and subsequently slouched downward.  Her son noticed this and she was caught and lowered into a chair.  No fall or injury;  No loss of consciousness.   EMS was then called.   On EMS arrival, blood pressure was 177/133, she was given 10 mg IV labetalol. She was also given IV fluids and oral glucose following a CBG of 54.   In the ED, BP noted to be elevated (max 244/85).  Positive Orthostatic vital signs.  Work up significant for mildly elevated creatinine of 1.15 and hypokalemia (K 2.9).   Review Of Systems: Per HPI with the following additions: gradual weight loss over the past 2 years; polyuria and polydipsia reported by patient.  Otherwise 12 point review of systems was performed and was unremarkable.  Patient Active Problem List   Diagnosis Date Noted  . Dementia    Past Medical History: Past Medical History  Diagnosis Date  . Hypertension   . Dementia    Past Surgical History: History reviewed. No pertinent past surgical history. Social History: History  Substance Use Topics  . Smoking status: Not on file  . Smokeless tobacco: Not on file  . Alcohol Use: Not on file   Please also refer to relevant sections of EMR.  Family History: No family history on file.  Allergies and Medications: No Known Allergies No current facility-administered medications on file prior to encounter.   Current Outpatient Prescriptions on File Prior to Encounter  Medication Sig Dispense Refill  . donepezil (ARICEPT) 10 MG tablet Take 1 tablet (10 mg total) by mouth at bedtime.  30 tablet  12  . memantine (NAMENDA) 10  MG tablet Take 1 tablet (10 mg total) by mouth 2 (two) times daily.  60 tablet  12  . sertraline (ZOLOFT) 50 MG tablet Take 1 tablet (50 mg total) by mouth daily.  30 tablet  12   Objective: BP 204/91  Pulse 65  Temp(Src) 97.8 F (36.6 C) (Oral)  Resp 19  SpO2 100% Exam: General: well appearing African American female resting in bed in NAD. HEENT: NCAT. PERRLA.  Neck: L carotid bruit noted.  Cardiovascular: RRR. 2/6 systolic murmur noted.  Respiratory: CTAB. No rales, rhonchi, or  wheezing. Abdomen: soft, nontender, nondistended. +BS. Extremities: No LE edema noted. Skin: warm dry, intact. Neuro: Alert and oriented to person.  Not oriented to place or time (this is her baseline per the son).  No focal neurological deficits.  CN 2-12 grossly intact.  5/5 muscle strength in all extremities.  Negative pronator drift and Negative Romberg.   Labs and Imaging: CBC BMET   Recent Labs Lab 03/10/13 1352  WBC 11.1*  HGB 13.6  HCT 40.0  PLT 230    Recent Labs Lab 03/10/13 1538  NA 142  K 2.9*  CL 104  CO2 28  BUN 11  CREATININE 1.15*  GLUCOSE 158*  CALCIUM 9.1     Dg Chest 2 View 03/10/2013  IMPRESSION: Negative exam.    EKG - NSR. Prolonged QT. LVH.  Flattened T waves noted; Additionally, inverted T waves noted in V3 and V4 (no prior EKG).   Tommie Sams, DO 03/10/2013, 7:22 PM PGY-2, [facility] FPTS Intern pager: [phone], text pages welcome

## 2013-03-11 ENCOUNTER — Encounter (HOSPITAL_COMMUNITY): Payer: Self-pay | Admitting: General Practice

## 2013-03-11 DIAGNOSIS — I517 Cardiomegaly: Secondary | ICD-10-CM

## 2013-03-11 DIAGNOSIS — I951 Orthostatic hypotension: Secondary | ICD-10-CM

## 2013-03-11 DIAGNOSIS — R9431 Abnormal electrocardiogram [ECG] [EKG]: Secondary | ICD-10-CM

## 2013-03-11 LAB — URINE CULTURE

## 2013-03-11 LAB — HEMOGLOBIN A1C: Hgb A1c MFr Bld: 5.7 % — ABNORMAL HIGH (ref <5.7)

## 2013-03-11 LAB — TROPONIN I
Troponin I: <0.3 ng/mL (ref <0.30)
Troponin I: <0.3 ng/mL (ref <0.30)

## 2013-03-11 LAB — CBC
HCT: 37.3 % (ref 36.0–46.0)
Hemoglobin: 12.5 g/dL (ref 12.0–15.0)
RBC: 4.71 MIL/uL (ref 3.87–5.11)
WBC: 9 10*3/uL (ref 4.0–10.5)

## 2013-03-11 LAB — BASIC METABOLIC PANEL
GFR calc Af Amer: 62 mL/min — ABNORMAL LOW (ref >90)
GFR calc non Af Amer: 53 mL/min — ABNORMAL LOW (ref >90)
Potassium: 3.1 mEq/L — ABNORMAL LOW (ref 3.5–5.1)
Sodium: 141 mEq/L (ref 135–145)

## 2013-03-11 LAB — MAGNESIUM: Magnesium: 2.1 mg/dL (ref 1.5–2.5)

## 2013-03-11 MED ORDER — LISINOPRIL 10 MG PO TABS: 10.0000 mg | ORAL_TABLET | Freq: Every day | ORAL | Status: DC

## 2013-03-11 MED ORDER — INFLUENZA VAC SPLIT QUAD 0.5 ML IM SUSP
0.5000 mL | INTRAMUSCULAR | Status: AC
  Administered 2013-03-11: 0.5 mL via INTRAMUSCULAR
  Filled 2013-03-11: qty 0.5

## 2013-03-11 MED ORDER — POTASSIUM CHLORIDE CRYS ER 20 MEQ PO TBCR
20.0000 meq | EXTENDED_RELEASE_TABLET | Freq: Two times a day (BID) | ORAL | Status: DC
  Administered 2013-03-11: 20 meq via ORAL
  Filled 2013-03-11: qty 1

## 2013-03-11 NOTE — Progress Notes (Signed)
Family Medicine Teaching Service Daily Progress Note Intern Pager: [phone]  Patient name: Alexandria Lowe Medical record number: [id number] Date of birth: Apr 17, 1956 Age: 57 y.o. Gender: female  Primary Care Provider: No primary provider on file. Consultants: None Code Status: Full  Pt Overview and Major Events to Date:  10/14: Admitted for hypertensive urgency and pre-syncope due to orthostasis  Alexandria Lowe is a 57 y.o. female presenting with hypertensive urgency and near sycope.   # Near Syncope and Orthostatic Hypotension - Unclear etiology. DDx - Volume depletion, secondary to medication, aging, dysautonomia, arrythmia, valvular abnormality; this does not appear to be vaso-vagal in origin.  - No reported telemetric events - NS @ 75 mL/hr x1L - Repeat ECG stable with LVH - Will get echo today for murmur in the setting of pre-syncope.  - Consider carotid dopplers in the setting of carotid bruit and near-syncope.  - Troponin neg x2 - WBC 11.1 > 9.0 - Orthostatics negative: 190/83 laying > 175/90 standing  # Hypertensive Emergency - Has not had lisinopril for 4 days PTA - Started Norvasc 10 mg daily.  - Holding Lisinopril for increased Cr (1.12 > 1.15) - Hydralazine 5mg  q4h prn BP >180/110 given once overnight  # Hypokalemia; Mg 2.1 - K 2.9 > 3.1 - Replete with KDUR today  # Dementia  - Continuing home medications - Aricept, Namenda.  - Also continuing home zoloft.   # Social  - SW consult for list of PCPs  FEN/GI: SLIV s/p 1L IVF, low salt diet  Prophylaxis: Subcutaneous heparin  Disposition: Pending echo, can be discharged after given PCP list by social work.  - Patient will need outpatient echo for evaluation of murmur.   Subjective: Alexandria Lowe had no acute events overnight, remains disoriented to all but person. He son, Alexandria Lowe, will be here later in the day.   Objective: Temp:  [97.8 F (36.6 C)-98.1 F (36.7 C)] 98.1 F (36.7 C)  (10/15 0625) Pulse Rate:  [55-81] 80 (10/15 0627) Resp:  [13-24] 18 (10/14 2217) BP: (135-244)/(66-102) 176/91 mmHg (10/15 0632) SpO2:  [99 %-100 %] 100 % (10/15 2952) Weight:  [136 lb 7.4 oz (61.9 kg)] 136 lb 7.4 oz (61.9 kg) (10/14 2217) Physical Exam: General: well appearing African American female resting in bed in NAD.  Neck: L carotid bruit vs. Systolic murmur transmission Cardiovascular: RRR. 2/6 systolic murmur noted.  Respiratory: CTAB. No rales, rhonchi, or wheezing.  Abdomen: soft, nontender, nondistended. +BS.  Extremities: WWP, no LE edema Neuro: Oriented to person only. No focal neurological deficits. CN II-XII grossly intact. 5/5 muscle strength in all extremities. Negative Romberg.   Laboratory:  Recent Labs Lab 03/10/13 1352 03/11/13 0420  WBC 11.1* 9.0  HGB 13.6 12.5  HCT 40.0 37.3  PLT 230 231    Recent Labs Lab 03/10/13 1538 03/11/13 0420  NA 142 141  K 2.9* 3.1*  CL 104 105  CO2 28 26  BUN 11 11  CREATININE 1.15* 1.12*  CALCIUM 9.1 9.2  PROT 6.6  --   BILITOT 0.5  --   ALKPHOS 74  --   ALT 41*  --   AST 26  --   GLUCOSE 158* 105*   Urine Cx: NGTD  Imaging/Diagnostic Tests: CXR 03/10/2013 IMPRESSION: Negative exam.   EKG 10/14 - NSR. Prolonged QT. LVH. Flattened T waves noted; Additionally, inverted T waves noted in V3 and V4 (no prior EKG).   ECG 10/15 NSR, TW flattening and TWI in  V3-4 again noted. QTc wnl. Meets LVH criteria.   Hazeline Junker, MD 03/11/2013, 7:53 AM PGY-1, [facility] FPTS Intern pager: [phone], text pages welcome

## 2013-03-11 NOTE — ED Provider Notes (Signed)
  This was a shared visit with a mid-level provided (NP or PA).  Throughout the patient's course I was available for consultation/collaboration.  I saw the ECG (if appropriate), relevant labs and studies - I agree with the interpretation.  On my exam the patient was in no distress.  However, with her EKG which was concerning for, with no QT prolongation, as well as her elevated blood pressure, she was admitted for further evaluation and management.     Gerhard Munch, MD 03/11/13 [phone]

## 2013-03-11 NOTE — Discharge Summary (Signed)
Family Medicine Teaching [facility] Discharge Summary  Patient name: Alexandria Lowe Medical record number: [id number] Date of birth: December 23, 1955 Age: 57 y.o. Gender: female Date of Admission: 03/10/2013  Date of Discharge: 03/11/2013 Admitting Physician: Sanjuana Letters, MD  Primary Care Provider: No primary provider on file. Consultants: None  Indication for Hospitalization: Near syncope, hypertensive urgency  Discharge Diagnoses/Problem List:  Hypertensive urgency Pre-syncope Orthostatic hypotension Hypokalemia Early-onset dementia  Disposition: To home  Discharge Condition: Stable  Discharge Exam:  General: well appearing African American female resting in bed in NAD.  Neck: R carotid bruit vs. systolic murmur transmission  Cardiovascular: RRR. 2/6 systolic murmur noted greatest at RUSB Respiratory: CTAB. No rales, rhonchi, or wheezing.  Neuro: Oriented to person only. No focal neurological deficits. CN II-XII grossly intact. 5/5 muscle strength in all extremities. Negative Romberg.   Brief Hospital Course: Alexandria Lowe is a 57 y.o. female with PMH of HTN, and early-onset dementia presenting with hypertensive urgency and near sycope.  She presented on 10/15 after a witnessed bout of dizziness, weakness, and near syncope without loss of consciousness, and was found to have orthostatic vital signs at the scene. Her BP increased upon arrival to 190s/90s, and her son remarked that she has not had a PCP in a long time and has been running out of her medications between visits to walk-in clinics. She had not taken her BP medication, lisinopril in the previous 4 days. She was found to be kypokalemic and had an ECG showing T wave flattening. She did not complain of chest pain, or SOB. She was kept overnight for further observation and work up. She has no recorded telemetric events, her ECG remained stable (showing LVH and T wave flattening with TWI in leads V3 and V4). Troponins  were negative, blood pressure was 190/83 laying, 175/90 standing the following morning. HTN was controlled with norvasc 10mg  in the hospital and she was discharged on her home dose lisinopril. Potassium was repleted. Aricept, namenda, and zoloft were continued.  She was discharged after one day of observation to the care of her son, with whom she lives. Social work provided a lists of PCPs and they were urged to establish care with reliable follow up to manage her hypertension and early-onset dementia.   An echocardiogram performed the day of discharge was significant only for LVH, and did not show any significant valvular or wall-motion abnormalities. See below for full read.   Issues for Follow Up:  - Would benefit from BP control and reliable follow up for hypertension - Mental status needs to be monitored by a PCP familiar with her baseline - Consider outpatient carotid dopplers  Significant Procedures: None  Significant Labs and Imaging:   Recent Labs Lab 03/10/13 1352 03/11/13 0420  WBC 11.1* 9.0  HGB 13.6 12.5  HCT 40.0 37.3  PLT 230 231    Recent Labs Lab 03/10/13 1538 03/11/13 0420  NA 142 141  K 2.9* 3.1*  CL 104 105  CO2 28 26  GLUCOSE 158* 105*  BUN 11 11  CREATININE 1.15* 1.12*  CALCIUM 9.1 9.2  MG  --  2.1  ALKPHOS 74  --   AST 26  --   ALT 41*  --   ALBUMIN 3.4*  --    CXR  03/10/2013 IMPRESSION: Negative exam.  EKG 10/14 - NSR. Prolonged QT. LVH. Flattened T waves noted; Additionally, inverted T waves noted in V3 and V4 (no prior EKG).  ECG 10/15 NSR, TW  flattening and TWI in V3-4 again noted. QTc wnl. Meets LVH criteria.  Hb A1c: 5.7%  2D ECHOCARDIOGRAM - Left ventricle: The cavity size was normal. Wall thickness was increased in a pattern of mild LVH. Systolic function was vigorous. The estimated ejection fraction was in the range of 65% to 70%. Wall motion was normal; there were no regional wall motion abnormalities. Doppler parameters  are consistent with abnormal left ventricular relaxation (grade 1 diastolic dysfunction). - Aortic valve: There was no stenosis. - Mitral valve: No significant regurgitation. - Right ventricle: The cavity size was normal. Systolic function was normal. - Pulmonary arteries: No complete TR doppler jet so unable to estimate PA systolic pressure. - Inferior vena cava: The vessel was normal in size; the respirophasic diameter changes were in the normal range (= 50%); findings are consistent with normal central venous pressure. - Pericardium, extracardiac: A trivial pericardial effusion was identified. Impressions:  - Normal LV size with vigorous systolic function, EF 65-70%. Mild LV hypertrophy. Normal RV size and systolic function. No significant valvular abnormalities.  Results/Tests Pending at Time of Discharge: None  Discharge Medications:    Medication List         calcium-vitamin D 500-200 MG-UNIT per tablet  Commonly known as:  OSCAL WITH D  Take 1 tablet by mouth daily with breakfast.     donepezil 10 MG tablet  Commonly known as:  ARICEPT  Take 10 mg by mouth every morning.     ibuprofen 200 MG tablet  Commonly known as:  ADVIL,MOTRIN  Take 400 mg by mouth every 6 (six) hours as needed for pain.     lisinopril 10 MG tablet  Commonly known as:  PRINIVIL,ZESTRIL  Take 1 tablet (10 mg total) by mouth daily.     memantine 10 MG tablet  Commonly known as:  NAMENDA  Take 1 tablet (10 mg total) by mouth 2 (two) times daily.     sertraline 50 MG tablet  Commonly known as:  ZOLOFT  Take 1 tablet (50 mg total) by mouth daily.        Discharge Instructions: Please refer to Patient Instructions section of EMR for full details.  Patient was counseled important signs and symptoms that should prompt return to medical care, changes in medications, dietary instructions, activity restrictions, and follow up appointments.   Follow-Up Appointments: Given list of PCPs prior  to discharge that would accept patients without insurance. To make appointment within the next 2 weeks.   Hazeline Junker, MD 03/12/2013, 8:17 AM PGY-1, [facility]

## 2013-03-11 NOTE — Progress Notes (Signed)
Pt. Discharged home with son.  Social worker called and provided list of PCP's.  Pt. and family have no further questions at this time

## 2013-03-11 NOTE — Progress Notes (Signed)
  Echocardiogram 2D Echocardiogram has been performed.  Alexandria Lowe 03/11/2013, 3:18 PM

## 2013-03-11 NOTE — H&P (Signed)
Family Medicine Teaching Service Attending Note  I interviewed and examined patient Alexandria Lowe and reviewed their tests and x-rays.  I discussed with Dr. Lauretta Grill and reviewed their note for today.  I agree with their assessment and plan.     Additionally  Feels well Not oriented to place or time - seems to have word finding issues Neuro - nonfocal  Near syncope ? Etiology - would get echo with murmur.  No signs of arrythmia.   Ok to discharge with family

## 2013-03-12 NOTE — Discharge Summary (Signed)
I have reviewed this discharge summary and agree.    

## 2013-03-12 NOTE — Progress Notes (Signed)
Family Medicine Teaching Service Attending Note  On 10-15 I interviewed and examined patient Alexandria Lowe and reviewed their tests and x-rays.  I discussed with Dr. Jarvis Newcomer and reviewed their note for today.  I agree with their assessment and plan.

## 2013-12-05 ENCOUNTER — Encounter (HOSPITAL_COMMUNITY): Payer: Self-pay | Admitting: Emergency Medicine

## 2013-12-05 ENCOUNTER — Emergency Department (HOSPITAL_COMMUNITY): Payer: Medicare HMO

## 2013-12-05 ENCOUNTER — Emergency Department (HOSPITAL_COMMUNITY)
Admission: EM | Admit: 2013-12-05 | Discharge: 2013-12-05 | Disposition: A | Discharge status: Home / Self Care | Payer: Medicare HMO | Attending: Emergency Medicine | Admitting: Emergency Medicine

## 2013-12-05 DIAGNOSIS — F0390 Unspecified dementia without behavioral disturbance: Secondary | ICD-10-CM | POA: Insufficient documentation

## 2013-12-05 DIAGNOSIS — Z792 Long term (current) use of antibiotics: Secondary | ICD-10-CM | POA: Insufficient documentation

## 2013-12-05 DIAGNOSIS — N3000 Acute cystitis without hematuria: Secondary | ICD-10-CM

## 2013-12-05 DIAGNOSIS — I10 Essential (primary) hypertension: Secondary | ICD-10-CM | POA: Insufficient documentation

## 2013-12-05 DIAGNOSIS — Z79899 Other long term (current) drug therapy: Secondary | ICD-10-CM | POA: Insufficient documentation

## 2013-12-05 DIAGNOSIS — R569 Unspecified convulsions: Secondary | ICD-10-CM | POA: Insufficient documentation

## 2013-12-05 LAB — CBC WITH DIFFERENTIAL/PLATELET
BASOS PCT: 0 % (ref 0–1)
Basophils Absolute: 0 10*3/uL (ref 0.0–0.1)
EOS PCT: 2 % (ref 0–5)
Eosinophils Absolute: 0.2 10*3/uL (ref 0.0–0.7)
HEMATOCRIT: 37.5 % (ref 36.0–46.0)
HEMOGLOBIN: 12.1 g/dL (ref 12.0–15.0)
Lymphocytes Relative: 35 % (ref 12–46)
Lymphs Abs: 3.1 10*3/uL (ref 0.7–4.0)
MCH: 26.4 pg (ref 26.0–34.0)
MCHC: 32.3 g/dL (ref 30.0–36.0)
MCV: 81.7 fL (ref 78.0–100.0)
MONOS PCT: 5 % (ref 3–12)
Monocytes Absolute: 0.4 10*3/uL (ref 0.1–1.0)
NEUTROS ABS: 5.2 10*3/uL (ref 1.7–7.7)
Neutrophils Relative %: 58 % (ref 43–77)
Platelets: 233 10*3/uL (ref 150–400)
RBC: 4.59 MIL/uL (ref 3.87–5.11)
RDW: 14.6 % (ref 11.5–15.5)
WBC: 8.9 10*3/uL (ref 4.0–10.5)

## 2013-12-05 LAB — COMPREHENSIVE METABOLIC PANEL
ALT: 59 U/L — ABNORMAL HIGH (ref 0–35)
AST: 32 U/L (ref 0–37)
Albumin: 3.3 g/dL — ABNORMAL LOW (ref 3.5–5.2)
Alkaline Phosphatase: 73 U/L (ref 39–117)
Anion gap: 18 — ABNORMAL HIGH (ref 5–15)
BILIRUBIN TOTAL: 0.6 mg/dL (ref 0.3–1.2)
BUN: 11 mg/dL (ref 6–23)
CALCIUM: 9.2 mg/dL (ref 8.4–10.5)
CHLORIDE: 105 meq/L (ref 96–112)
CO2: 22 mEq/L (ref 19–32)
Creatinine, Ser: 1.03 mg/dL (ref 0.50–1.10)
GFR calc Af Amer: 68 mL/min — ABNORMAL LOW (ref >90)
GFR, EST NON AFRICAN AMERICAN: 59 mL/min — AB (ref >90)
Glucose, Bld: 144 mg/dL — ABNORMAL HIGH (ref 70–99)
Potassium: 3.8 mEq/L (ref 3.7–5.3)
Sodium: 145 mEq/L (ref 137–147)
Total Protein: 6.4 g/dL (ref 6.0–8.3)

## 2013-12-05 LAB — URINALYSIS, ROUTINE W REFLEX MICROSCOPIC
BILIRUBIN URINE: NEGATIVE
Glucose, UA: NEGATIVE mg/dL
HGB URINE DIPSTICK: NEGATIVE
Ketones, ur: NEGATIVE mg/dL
Nitrite: NEGATIVE
PROTEIN: NEGATIVE mg/dL
Specific Gravity, Urine: 1.014 (ref 1.005–1.030)
UROBILINOGEN UA: 0.2 mg/dL (ref 0.0–1.0)
pH: 7.5 (ref 5.0–8.0)

## 2013-12-05 LAB — CBG MONITORING, ED: GLUCOSE-CAPILLARY: 143 mg/dL — AB (ref 70–99)

## 2013-12-05 LAB — URINE MICROSCOPIC-ADD ON

## 2013-12-05 LAB — TROPONIN I

## 2013-12-05 MED ORDER — LISINOPRIL 10 MG PO TABS
10.0000 mg | ORAL_TABLET | Freq: Once | ORAL | Status: AC
  Administered 2013-12-05: 10 mg via ORAL
  Filled 2013-12-05: qty 1

## 2013-12-05 MED ORDER — CEPHALEXIN 500 MG PO CAPS: 500.0000 mg | ORAL_CAPSULE | Freq: Two times a day (BID) | ORAL | Status: DC

## 2013-12-05 MED ORDER — LABETALOL HCL 5 MG/ML IV SOLN
5.0000 mg | Freq: Once | INTRAVENOUS | Status: AC
  Administered 2013-12-05: 5 mg via INTRAVENOUS
  Filled 2013-12-05: qty 4

## 2013-12-05 MED ORDER — HYDRALAZINE HCL 20 MG/ML IJ SOLN
5.0000 mg | Freq: Once | INTRAMUSCULAR | Status: AC
  Administered 2013-12-05: 5 mg via INTRAVENOUS
  Filled 2013-12-05: qty 1

## 2013-12-05 NOTE — ED Notes (Signed)
C collar removed

## 2013-12-05 NOTE — ED Notes (Signed)
Family at bedside. Pt resting , no complaints at this time

## 2013-12-05 NOTE — ED Notes (Signed)
Pt here from home , family states that they heard a yell and a thud, pt was found in the floor shaking with seizure like activity , no history of seizure

## 2013-12-05 NOTE — ED Notes (Signed)
Patient transported to CT 

## 2013-12-05 NOTE — ED Notes (Signed)
Seizure pads placed on bed

## 2013-12-05 NOTE — ED Notes (Signed)
Pt removed from backboard , no c/o back pain , pt remains in c collar

## 2013-12-05 NOTE — ED Notes (Signed)
Returned from ct scan 

## 2013-12-05 NOTE — ED Provider Notes (Signed)
CSN: [id number]     Arrival date & time 12/05/13  0756 History   First MD Initiated Contact with Patient 12/05/13 0757     Chief Complaint  Patient presents with  . Seizures     (Consider location/radiation/quality/duration/timing/severity/associated sxs/prior Treatment) HPI  This is a 58 year old female with reported history of dementia and hypertension who presents with possible seizure activity. Patient presents by EMS. History is mostly obtained by EMS and nursing. Per EMS, they were called to the patient's house for possible seizure activity. The family reported that they heard a yell and, patient on the floor. The patient appeared to be shaking. Upon EMS arrival, patient was found to have snoring respirations and appeared to be postictal with a blood pressure in the 240s. The patient's respiratory status and mental status improved. She is currently only alert x1 with an unknown baseline given her history of dementia. She denies any pain but otherwise is noncontributory to history taking.  Level V caveat for dementia  Past Medical History  Diagnosis Date  . Hypertension   . Dementia    History reviewed. No pertinent past surgical history. History reviewed. No pertinent family history. History  Substance Use Topics  . Smoking status: Never Smoker   . Smokeless tobacco: Never Used  . Alcohol Use: No   OB History   Grav Para Term Preterm Abortions TAB SAB Ect Mult Living                 Review of Systems  Unable to perform ROS: Dementia      Allergies  Review of patient's allergies indicates no known allergies.  Home Medications   Prior to Admission medications   Medication Sig Start Date End Date Taking? Authorizing Provider  calcium-vitamin D (OSCAL WITH D) 500-200 MG-UNIT per tablet Take 1 tablet by mouth daily with breakfast.    Historical Provider, MD  cephALEXin (KEFLEX) 500 MG capsule Take 1 capsule (500 mg total) by mouth 2 (two) times daily. 12/05/13    Shon Batonourtney F Horton, MD  donepezil (ARICEPT) 10 MG tablet Take 10 mg by mouth every morning.    Historical Provider, MD  ibuprofen (ADVIL,MOTRIN) 200 MG tablet Take 400 mg by mouth every 6 (six) hours as needed for pain.     Historical Provider, MD  lisinopril (PRINIVIL,ZESTRIL) 10 MG tablet Take 1 tablet (10 mg total) by mouth daily. 03/11/13   Myra RudeJeremy E Schmitz, MD  memantine (NAMENDA) 10 MG tablet Take 1 tablet (10 mg total) by mouth 2 (two) times daily. 03/06/13   Levert FeinsteinYijun Yan, MD  sertraline (ZOLOFT) 50 MG tablet Take 1 tablet (50 mg total) by mouth daily. 03/06/13   Levert FeinsteinYijun Yan, MD   BP 188/81  Pulse 63  Temp(Src) 97.9 F (36.6 C) (Oral)  Resp 16  SpO2 100% Physical Exam  Nursing note and vitals reviewed. Constitutional: She appears well-developed and well-nourished. No distress.  HENT:  Head: Normocephalic and atraumatic.  Mouth/Throat: Oropharynx is clear and moist.  Eyes: Pupils are equal, round, and reactive to light.  Neck:  C. collar in place  Cardiovascular: Normal rate, regular rhythm and normal heart sounds.   No murmur heard. Pulmonary/Chest: Effort normal and breath sounds normal. No respiratory distress. She has no wheezes.  Abdominal: Soft. Bowel sounds are normal. There is no tenderness. There is no rebound.  Musculoskeletal: She exhibits no edema.  Neurological: She is alert.  Oriented to self only, moves all 4 extremities with 5 out of 5 strength in  all 4 extremities  Skin: Skin is warm and dry.  Psychiatric: She has a normal mood and affect.    ED Course  Procedures (including critical care time) Labs Review Labs Reviewed  COMPREHENSIVE METABOLIC PANEL - Abnormal; Notable for the following:    Glucose, Bld 144 (*)    Albumin 3.3 (*)    ALT 59 (*)    GFR calc non Af Amer 59 (*)    GFR calc Af Amer 68 (*)    Anion gap 18 (*)    All other components within normal limits  URINALYSIS, ROUTINE W REFLEX MICROSCOPIC - Abnormal; Notable for the following:     Leukocytes, UA SMALL (*)    All other components within normal limits  URINE MICROSCOPIC-ADD ON - Abnormal; Notable for the following:    Bacteria, UA FEW (*)    All other components within normal limits  CBG MONITORING, ED - Abnormal; Notable for the following:    Glucose-Capillary 143 (*)    All other components within normal limits  URINE CULTURE  CBC WITH DIFFERENTIAL  TROPONIN I    Imaging Review Ct Head Wo Contrast  12/05/2013   CLINICAL DATA:  The patient was found down.  Seizure like activity.  EXAM: CT HEAD WITHOUT CONTRAST  CT CERVICAL SPINE WITHOUT CONTRAST  TECHNIQUE: Multidetector CT imaging of the head and cervical spine was performed following the standard protocol without intravenous contrast. Multiplanar CT image reconstructions of the cervical spine were also generated.  COMPARISON:  MRI of the brain dated 11/03/2008 and MRI of the cervical spine dated 02/27/2008  FINDINGS: CT HEAD FINDINGS  No mass lesion. No midline shift. No acute hemorrhage or hematoma. No extra-axial fluid collections. No evidence of acute infarction. There is slight diffuse cerebral cortical atrophy. No osseous abnormality.  CT CERVICAL SPINE FINDINGS  There is no fracture, subluxation, prevertebral soft tissue swelling, or other acute abnormality. Minimal degenerative changes at C5-6 with anterior osteophytes at C6-7 and C7-T1. There is also slight degenerative disc disease at C3-4.  IMPRESSION: 1. No acute intracranial abnormality. Slight diffuse atrophy, stable. 2. No acute abnormality of the cervical spine. Slight degenerative changes.   Electronically Signed   By: Geanie Cooley M.D.   On: 12/05/2013 09:01   Ct Cervical Spine Wo Contrast  12/05/2013   CLINICAL DATA:  The patient was found down.  Seizure like activity.  EXAM: CT HEAD WITHOUT CONTRAST  CT CERVICAL SPINE WITHOUT CONTRAST  TECHNIQUE: Multidetector CT imaging of the head and cervical spine was performed following the standard protocol without  intravenous contrast. Multiplanar CT image reconstructions of the cervical spine were also generated.  COMPARISON:  MRI of the brain dated 11/03/2008 and MRI of the cervical spine dated 02/27/2008  FINDINGS: CT HEAD FINDINGS  No mass lesion. No midline shift. No acute hemorrhage or hematoma. No extra-axial fluid collections. No evidence of acute infarction. There is slight diffuse cerebral cortical atrophy. No osseous abnormality.  CT CERVICAL SPINE FINDINGS  There is no fracture, subluxation, prevertebral soft tissue swelling, or other acute abnormality. Minimal degenerative changes at C5-6 with anterior osteophytes at C6-7 and C7-T1. There is also slight degenerative disc disease at C3-4.  IMPRESSION: 1. No acute intracranial abnormality. Slight diffuse atrophy, stable. 2. No acute abnormality of the cervical spine. Slight degenerative changes.   Electronically Signed   By: Geanie Cooley M.D.   On: 12/05/2013 09:01     EKG Interpretation   Date/Time:  Saturday December 05 2013 07:58:14  EDT Ventricular Rate:  61 PR Interval:  140 QRS Duration: 96 QT Interval:  405 QTC Calculation: 408 R Axis:   63 Text Interpretation:  Sinus rhythm Consider anterolateral infarct  Borderline repolarization abnormality Similar to prior Confirmed by HORTON   MD, COURTNEY ([id number]) on 12/05/2013 8:05:12 AM      MDM   Final diagnoses:  Seizure  Essential hypertension  Acute cystitis without hematuria    Patient presents with possible seizure activity. History of dementia. She appears to be her baseline.  Patient's son is at the bedside. He confirms patient was found in the hallway after apparently falling and was noted to have shaking of her upper tremor these. After that she appeared sleepy. This lasted her for approximately 1 minute. No history of seizures. He states that the patient is currently at her baseline. Vital signs notable for blood pressure of 178/76. No focal abnormalities noted.  EKG is reassuring.  Workup only notable for 7-10 white cells and few bacteria in the urine. Patient is afebrile. Will culture and treat. Patient's blood pressure increased while in the emergency department. Patient was given her morning blood pressure medications as well as IV blood pressure medications with improvement of pressures. She continues to be her baseline. Discussed with the son followup with neurology. Given this was a first-time seizure, will not sent home with medications. Son was given return precautions.  After history, exam, and medical workup I feel the patient has been appropriately medically screened and is safe for discharge home. Pertinent diagnoses were discussed with the patient. Patient was given return precautions.     Shon Baton, MD 12/05/13 1311

## 2013-12-05 NOTE — Discharge Instructions (Signed)
Seizure, Adult A seizure is abnormal electrical activity in the brain. Seizures usually last from 30 seconds to 2 minutes. There are various types of seizures. Before a seizure, you may have a warning sensation (aura) that a seizure is about to occur. An aura may include the following symptoms:   Fear or anxiety.  Nausea.  Feeling like the room is spinning (vertigo).  Vision changes, such as seeing flashing lights or spots. Common symptoms during a seizure include:  A change in attention or behavior (altered mental status).Convulsions with rhythmic jerking movements.  Drooling.  Rapid eye movements.  Grunting.  Loss of bladder and bowel control.  Bitter taste in the mouth.  Tongue biting. After a seizure, you may feel confused and sleepy. You may also have an injury resulting from convulsions during the seizure. HOME CARE INSTRUCTIONS   If you are given medicines, take them exactly as prescribed by your health care provider.  Keep all follow-up appointments as directed by your health care provider.  Do not swim or drive or engage in risky activity during which a seizure could cause further injury to you or others until your health care provider says it is OK.  Get adequate rest.  Teach friends and family what to do if you have a seizure. They should:  Lay you on the ground to prevent a fall.  Put a cushion under your head.  Loosen any tight clothing around your neck.  Turn you on your side. If vomiting occurs, this helps keep your airway clear.  Stay with you until you recover.  Know whether or not you need emergency care. SEEK IMMEDIATE MEDICAL CARE IF:  The seizure lasts longer than 5 minutes.  The seizure is severe or you do not wake up immediately after the seizure.  You have an altered mental status after the seizure.  You are having more frequent or worsening seizures. Someone should drive you to the emergency department or call local emergency  services (911 in [place] MAKE SURE YOU:  Understand these instructions.  Will watch your condition.  Will get help right away if you are not doing well or get worse. Document Released: 05/11/2000 Document Revised: 03/04/2013 Document Reviewed: 12/24/2012 [facility] Patient Information 2015 [place], [place]. This information is not intended to replace advice given to you by your health care provider. Make sure you discuss any questions you have with your health care provider. Hypertension Hypertension, commonly called high blood pressure, is when the force of blood pumping through your arteries is too strong. Your arteries are the blood vessels that carry blood from your heart throughout your body. A blood pressure reading consists of a higher number over a lower number, such as 110/72. The higher number (systolic) is the pressure inside your arteries when your heart pumps. The lower number (diastolic) is the pressure inside your arteries when your heart relaxes. Ideally you want your blood pressure below 120/80. Hypertension forces your heart to work harder to pump blood. Your arteries may become narrow or stiff. Having hypertension puts you at risk for heart disease, stroke, and other problems.  RISK FACTORS Some risk factors for high blood pressure are controllable. Others are not.  Risk factors you cannot control include:   Race. You may be at higher risk if you are African American.  Age. Risk increases with age.  Gender. Men are at higher risk than women before age 25 years. After age 83, women are at higher risk than men. Risk factors you can  control include:  Not getting enough exercise or physical activity.  Being overweight.  Getting too much fat, sugar, calories, or salt in your diet.  Drinking too much alcohol. SIGNS AND SYMPTOMS Hypertension does not usually cause signs or symptoms. Extremely high blood pressure (hypertensive crisis) may cause headache, anxiety, shortness of  breath, and nosebleed. DIAGNOSIS  To check if you have hypertension, your health care provider will measure your blood pressure while you are seated, with your arm held at the level of your heart. It should be measured at least twice using the same arm. Certain conditions can cause a difference in blood pressure between your right and left arms. A blood pressure reading that is higher than normal on one occasion does not mean that you need treatment. If one blood pressure reading is high, ask your health care provider about having it checked again. TREATMENT  Treating high blood pressure includes making lifestyle changes and possibly taking medication. Living a healthy lifestyle can help lower high blood pressure. You may need to change some of your habits. Lifestyle changes may include:  Following the DASH diet. This diet is high in fruits, vegetables, and whole grains. It is low in salt, red meat, and added sugars.  Getting at least 2 1/2 hours of brisk physical activity every week.  Losing weight if necessary.  Not smoking.  Limiting alcoholic beverages.  Learning ways to reduce stress. If lifestyle changes are not enough to get your blood pressure under control, your health care provider may prescribe medicine. You may need to take more than one. Work closely with your health care provider to understand the risks and benefits. HOME CARE INSTRUCTIONS  Have your blood pressure rechecked as directed by your health care provider.   Only take medicine as directed by your health care provider. Follow the directions carefully. Blood pressure medicines must be taken as prescribed. The medicine does not work as well when you skip doses. Skipping doses also puts you at risk for problems.   Do not smoke.   Monitor your blood pressure at home as directed by your health care provider. SEEK MEDICAL CARE IF:   You think you are having a reaction to medicines taken.  You have recurrent  headaches or feel dizzy.  You have swelling in your ankles.  You have trouble with your vision. SEEK IMMEDIATE MEDICAL CARE IF:  You develop a severe headache or confusion.  You have unusual weakness, numbness, or feel faint.  You have severe chest or abdominal pain.  You vomit repeatedly.  You have trouble breathing. MAKE SURE YOU:   Understand these instructions.  Will watch your condition.  Will get help right away if you are not doing well or get worse. Document Released: 05/14/2005 Document Revised: 05/19/2013 Document Reviewed: 03/06/2013 [facility] Patient Information 2015 [place], [place]. This information is not intended to replace advice given to you by your health care provider. Make sure you discuss any questions you have with your health care provider. Urinary Tract Infection Urinary tract infections (UTIs) can develop anywhere along your urinary tract. Your urinary tract is your body's drainage system for removing wastes and extra water. Your urinary tract includes two kidneys, two ureters, a bladder, and a urethra. Your kidneys are a pair of bean-shaped organs. Each kidney is about the size of your fist. They are located below your ribs, one on each side of your spine. CAUSES Infections are caused by microbes, which are microscopic organisms, including fungi, viruses, and bacteria.  These organisms are so small that they can only be seen through a microscope. Bacteria are the microbes that most commonly cause UTIs. SYMPTOMS  Symptoms of UTIs may vary by age and gender of the patient and by the location of the infection. Symptoms in young women typically include a frequent and intense urge to urinate and a painful, burning feeling in the bladder or urethra during urination. Older women and men are more likely to be tired, shaky, and weak and have muscle aches and abdominal pain. A fever may mean the infection is in your kidneys. Other symptoms of a kidney infection include  pain in your back or sides below the ribs, nausea, and vomiting. DIAGNOSIS To diagnose a UTI, your caregiver will ask you about your symptoms. Your caregiver also will ask to provide a urine sample. The urine sample will be tested for bacteria and white blood cells. White blood cells are made by your body to help fight infection. TREATMENT  Typically, UTIs can be treated with medication. Because most UTIs are caused by a bacterial infection, they usually can be treated with the use of antibiotics. The choice of antibiotic and length of treatment depend on your symptoms and the type of bacteria causing your infection. HOME CARE INSTRUCTIONS  If you were prescribed antibiotics, take them exactly as your caregiver instructs you. Finish the medication even if you feel better after you have only taken some of the medication.  Drink enough water and fluids to keep your urine clear or pale yellow.  Avoid caffeine, tea, and carbonated beverages. They tend to irritate your bladder.  Empty your bladder often. Avoid holding urine for long periods of time.  Empty your bladder before and after sexual intercourse.  After a bowel movement, women should cleanse from front to back. Use each tissue only once. SEEK MEDICAL CARE IF:   You have back pain.  You develop a fever.  Your symptoms do not begin to resolve within 3 days. SEEK IMMEDIATE MEDICAL CARE IF:   You have severe back pain or lower abdominal pain.  You develop chills.  You have nausea or vomiting.  You have continued burning or discomfort with urination. MAKE SURE YOU:   Understand these instructions.  Will watch your condition.  Will get help right away if you are not doing well or get worse. Document Released: 02/21/2005 Document Revised: 11/13/2011 Document Reviewed: 06/22/2011 [facility] Patient Information 2015 [place]. This information is not intended to replace advice given to you by your health care provider. Make  sure you discuss any questions you have with your health care provider.

## 2013-12-06 LAB — URINE CULTURE
Colony Count: NO GROWTH
Culture: NO GROWTH

## 2014-03-18 ENCOUNTER — Encounter (HOSPITAL_COMMUNITY): Payer: Self-pay | Admitting: Emergency Medicine

## 2014-03-18 ENCOUNTER — Emergency Department (HOSPITAL_COMMUNITY)
Admission: EM | Admit: 2014-03-18 | Discharge: 2014-03-18 | Disposition: A | Discharge status: Home / Self Care | Payer: Medicare HMO | Attending: Emergency Medicine | Admitting: Emergency Medicine

## 2014-03-18 ENCOUNTER — Emergency Department (HOSPITAL_COMMUNITY): Payer: Medicare HMO

## 2014-03-18 DIAGNOSIS — Y929 Unspecified place or not applicable: Secondary | ICD-10-CM | POA: Diagnosis not present

## 2014-03-18 DIAGNOSIS — W1839XA Other fall on same level, initial encounter: Secondary | ICD-10-CM | POA: Diagnosis not present

## 2014-03-18 DIAGNOSIS — F0390 Unspecified dementia without behavioral disturbance: Secondary | ICD-10-CM | POA: Diagnosis not present

## 2014-03-18 DIAGNOSIS — Z79899 Other long term (current) drug therapy: Secondary | ICD-10-CM | POA: Insufficient documentation

## 2014-03-18 DIAGNOSIS — T148XXA Other injury of unspecified body region, initial encounter: Secondary | ICD-10-CM

## 2014-03-18 DIAGNOSIS — W19XXXA Unspecified fall, initial encounter: Secondary | ICD-10-CM

## 2014-03-18 DIAGNOSIS — S0093XA Contusion of unspecified part of head, initial encounter: Secondary | ICD-10-CM | POA: Diagnosis not present

## 2014-03-18 DIAGNOSIS — I10 Essential (primary) hypertension: Secondary | ICD-10-CM | POA: Insufficient documentation

## 2014-03-18 DIAGNOSIS — Y939 Activity, unspecified: Secondary | ICD-10-CM | POA: Insufficient documentation

## 2014-03-18 DIAGNOSIS — S0990XA Unspecified injury of head, initial encounter: Secondary | ICD-10-CM | POA: Diagnosis present

## 2014-03-18 NOTE — ED Notes (Signed)
[facility] Pt has hematoma on forehead.  Pt has dementia, pt denies any other complaints.

## 2014-03-18 NOTE — ED Provider Notes (Signed)
CSN: [id number]     Arrival date & time 03/18/14  1855 History   First MD Initiated Contact with Patient 03/18/14 1855     Chief Complaint  Patient presents with  . Fall  . hematoma      (Consider location/radiation/quality/duration/timing/severity/associated sxs/prior Treatment) HPI Comments: Pt came from facility due to unwitnessed fall and injury to the forehead.  Pt denies any pain but is unable to recall the event.  However this is due to dementia not LOC. Per facility pt is at her baseline and tends to roam and fall frequently  Patient is a 58 y.o. female presenting with fall. The history is provided by the EMS personnel and the nursing home. The history is limited by the condition of the patient.  Fall    Past Medical History  Diagnosis Date  . Hypertension   . Dementia    History reviewed. No pertinent past surgical history. No family history on file. History  Substance Use Topics  . Smoking status: Never Smoker   . Smokeless tobacco: Never Used  . Alcohol Use: No   OB History   Grav Para Term Preterm Abortions TAB SAB Ect Mult Living                 Review of Systems  Unable to perform ROS     Allergies  Review of patient's allergies indicates no known allergies.  Home Medications   Prior to Admission medications   Medication Sig Start Date End Date Taking? Authorizing Provider  calcium-vitamin D (OSCAL WITH D) 500-200 MG-UNIT per tablet Take 1 tablet by mouth daily with breakfast.    Historical Provider, MD  cephALEXin (KEFLEX) 500 MG capsule Take 1 capsule (500 mg total) by mouth 2 (two) times daily. 12/05/13   Shon Batonourtney F Horton, MD  donepezil (ARICEPT) 10 MG tablet Take 10 mg by mouth every morning.    Historical Provider, MD  ibuprofen (ADVIL,MOTRIN) 200 MG tablet Take 400 mg by mouth every 6 (six) hours as needed for pain.     Historical Provider, MD  lisinopril (PRINIVIL,ZESTRIL) 10 MG tablet Take 1 tablet (10 mg total) by mouth daily. 03/11/13    Myra RudeJeremy E Schmitz, MD  memantine (NAMENDA) 10 MG tablet Take 1 tablet (10 mg total) by mouth 2 (two) times daily. 03/06/13   Levert FeinsteinYijun Yan, MD  sertraline (ZOLOFT) 50 MG tablet Take 1 tablet (50 mg total) by mouth daily. 03/06/13   Levert FeinsteinYijun Yan, MD   BP 151/57  Pulse 76  Temp(Src) 98.7 F (37.1 C) (Oral)  Resp 18  SpO2 100% Physical Exam  Nursing note and vitals reviewed. Constitutional: She appears well-developed and well-nourished. No distress.  HENT:  Head: Normocephalic. Head is with contusion.    Mouth/Throat: Oropharynx is clear and moist.  Eyes: Conjunctivae and EOM are normal. Pupils are equal, round, and reactive to light.  Neck: Normal range of motion. Neck supple. No spinous process tenderness and no muscular tenderness present.  Cardiovascular: Normal rate, regular rhythm and intact distal pulses.   No murmur heard. Pulmonary/Chest: Effort normal and breath sounds normal. No respiratory distress. She has no wheezes. She has no rales.  Abdominal: Soft. She exhibits no distension. There is no tenderness. There is no rebound and no guarding.  Musculoskeletal: Normal range of motion. She exhibits no edema and no tenderness.  Neurological: She is alert.  Oriented to person  Skin: Skin is warm and dry. No rash noted. No erythema.  Psychiatric: She has a normal  mood and affect. Her behavior is normal.    ED Course  Procedures (including critical care time) Labs Review Labs Reviewed - No data to display  Imaging Review Ct Head Wo Contrast  03/18/2014   CLINICAL DATA:  Unwitnessed fall. Patient has a hematoma on the forehead. Patient has dementia.  EXAM: CT HEAD WITHOUT CONTRAST  TECHNIQUE: Contiguous axial images were obtained from the base of the skull through the vertex without intravenous contrast.  COMPARISON:  December 05, 2013  FINDINGS: There is chronic diffuse atrophy. Chronic bilateral periventricular white matter small vessel ischemic changes identified. There is no midline  shift, hydrocephalus, or mass. No acute hemorrhage or acute transcortical infarct is identified. The bony calvarium is intact. The visualized sinuses are clear. There is a mild mid frontal scalp swelling.  IMPRESSION: No focal acute intracranial abnormality identified. Mild mid the frontal scalp swelling.  Chronic diffuse atrophy and chronic bilateral periventricular white matter small vessel ischemic change.   Electronically Signed   By: Sherian ReinWei-Chen  Lin M.D.   On: 03/18/2014 20:49     EKG Interpretation None      MDM   Final diagnoses:  Fall  Hematoma    Patients who lives in a facility due to dementia he presents today after a fall that was unwitnessed. Patient is at her baseline per facility and has a hematoma in the center of her for her head. She is awake alert and has no complaints. She is moving her arms and legs without difficulty. No neck pain. Patient does not take anticoagulation. Head CT pending  9:07 PM Head CT neg and pt d/ced home.  Gwyneth SproutWhitney Rahmir Beever, MD 03/18/14 2107

## 2014-03-31 ENCOUNTER — Encounter (HOSPITAL_COMMUNITY): Payer: Self-pay | Admitting: Emergency Medicine

## 2014-03-31 ENCOUNTER — Emergency Department (HOSPITAL_COMMUNITY)
Admission: EM | Admit: 2014-03-31 | Discharge: 2014-03-31 | Disposition: A | Discharge status: Home / Self Care | Payer: Medicare HMO | Attending: Emergency Medicine | Admitting: Emergency Medicine

## 2014-03-31 DIAGNOSIS — I10 Essential (primary) hypertension: Secondary | ICD-10-CM | POA: Diagnosis not present

## 2014-03-31 DIAGNOSIS — Z043 Encounter for examination and observation following other accident: Secondary | ICD-10-CM | POA: Diagnosis present

## 2014-03-31 DIAGNOSIS — Y939 Activity, unspecified: Secondary | ICD-10-CM | POA: Diagnosis not present

## 2014-03-31 DIAGNOSIS — W1830XA Fall on same level, unspecified, initial encounter: Secondary | ICD-10-CM | POA: Insufficient documentation

## 2014-03-31 DIAGNOSIS — F0390 Unspecified dementia without behavioral disturbance: Secondary | ICD-10-CM | POA: Insufficient documentation

## 2014-03-31 DIAGNOSIS — Z79899 Other long term (current) drug therapy: Secondary | ICD-10-CM | POA: Diagnosis not present

## 2014-03-31 DIAGNOSIS — Z792 Long term (current) use of antibiotics: Secondary | ICD-10-CM | POA: Insufficient documentation

## 2014-03-31 DIAGNOSIS — W19XXXA Unspecified fall, initial encounter: Secondary | ICD-10-CM

## 2014-03-31 DIAGNOSIS — Y929 Unspecified place or not applicable: Secondary | ICD-10-CM | POA: Diagnosis not present

## 2014-03-31 NOTE — Discharge Instructions (Signed)
Please monitor your condition carefully, and do not hesitate to return here for concerning changes in your condition.  Fall Prevention and Home Safety Falls cause injuries and can affect all age groups. It is possible to use preventive measures to significantly decrease the likelihood of falls. There are many simple measures which can make your home safer and prevent falls. OUTDOORS  Repair cracks and edges of walkways and driveways.  Remove high doorway thresholds.  Trim shrubbery on the main path into your home.  Have good outside lighting.  Clear walkways of tools, rocks, debris, and clutter.  Check that handrails are not broken and are securely fastened. Both sides of steps should have handrails.  Have leaves, snow, and ice cleared regularly.  Use sand or salt on walkways during winter months.  In the garage, clean up grease or oil spills. BATHROOM  Install night lights.  Install grab bars by the toilet and in the tub and shower.  Use non-skid mats or decals in the tub or shower.  Place a plastic non-slip stool in the shower to sit on, if needed.  Keep floors dry and clean up all water on the floor immediately.  Remove soap buildup in the tub or shower on a regular basis.  Secure bath mats with non-slip, double-sided rug tape.  Remove throw rugs and tripping hazards from the floors. BEDROOMS  Install night lights.  Make sure a bedside light is easy to reach.  Do not use oversized bedding.  Keep a telephone by your bedside.  Have a firm chair with side arms to use for getting dressed.  Remove throw rugs and tripping hazards from the floor. KITCHEN  Keep handles on pots and pans turned toward the center of the stove. Use back burners when possible.  Clean up spills quickly and allow time for drying.  Avoid walking on wet floors.  Avoid hot utensils and knives.  Position shelves so they are not too high or low.  Place commonly used objects within easy  reach.  If necessary, use a sturdy step stool with a grab bar when reaching.  Keep electrical cables out of the way.  Do not use floor polish or wax that makes floors slippery. If you must use wax, use non-skid floor wax.  Remove throw rugs and tripping hazards from the floor. STAIRWAYS  Never leave objects on stairs.  Place handrails on both sides of stairways and use them. Fix any loose handrails. Make sure handrails on both sides of the stairways are as long as the stairs.  Check carpeting to make sure it is firmly attached along stairs. Make repairs to worn or loose carpet promptly.  Avoid placing throw rugs at the top or bottom of stairways, or properly secure the rug with carpet tape to prevent slippage. Get rid of throw rugs, if possible.  Have an electrician put in a light switch at the top and bottom of the stairs. OTHER FALL PREVENTION TIPS  Wear low-heel or rubber-soled shoes that are supportive and fit well. Wear closed toe shoes.  When using a stepladder, make sure it is fully opened and both spreaders are firmly locked. Do not climb a closed stepladder.  Add color or contrast paint or tape to grab bars and handrails in your home. Place contrasting color strips on first and last steps.  Learn and use mobility aids as needed. Install an electrical emergency response system.  Turn on lights to avoid dark areas. Replace light bulbs that burn out  immediately. Get light switches that glow.  Arrange furniture to create clear pathways. Keep furniture in the same place.  Firmly attach carpet with non-skid or double-sided tape.  Eliminate uneven floor surfaces.  Select a carpet pattern that does not visually hide the edge of steps.  Be aware of all pets. OTHER HOME SAFETY TIPS  Set the water temperature for 120 F (48.8 C).  Keep emergency numbers on or near the telephone.  Keep smoke detectors on every level of the home and near sleeping areas. Document Released:  05/04/2002 Document Revised: 11/13/2011 Document Reviewed: 08/03/2011 [facility] Patient Information 2015 [place], [place]. This information is not intended to replace advice given to you by your health care provider. Make sure you discuss any questions you have with your health care provider.

## 2014-03-31 NOTE — ED Notes (Signed)
[facility]

## 2014-03-31 NOTE — ED Notes (Signed)
[facility]  Per EMS staff reports that pt wanders during the night normally.  This morning staff found her on the floor of her room, pt unable to rpovide information.  Per EMS pt c/o headache.  No obvious trauma to head noted.  Per EMS vitals BP 200's/90, HR 80's, RR 16.  Pt resting in bed, NAD noted at this time.

## 2014-03-31 NOTE — ED Notes (Signed)
Bed: WA03 Expected date: 03/31/14 Expected time: 7:21 AM Means of arrival: Ambulance Comments: EMS  Fall

## 2014-03-31 NOTE — ED Notes (Signed)
Pt sitting in chair at nurses' desk bc keep getting out of bed.

## 2014-03-31 NOTE — ED Provider Notes (Signed)
CSN: [id number]     Arrival date & time 03/31/14  0729 History   First MD Initiated Contact with Patient 03/31/14 0732     Chief Complaint  Patient presents with  . Fall      HPI  Patient presents from her nursing facility after an unwitnessed fall. Patient was found on the floor, just prior to ED arrival. Per report the patient wanders at night, has disrupted sleep-wake cycle. Patient has dementia, level V caveat. Per EMS, the patient was awake and alert, interactive in route, with vital signs notable for hypertension.  Past Medical History  Diagnosis Date  . Hypertension   . Dementia    No past surgical history on file. No family history on file. History  Substance Use Topics  . Smoking status: Never Smoker   . Smokeless tobacco: Never Used  . Alcohol Use: No   OB History    No data available     Review of Systems  Unable to perform ROS: Dementia      Allergies  Review of patient's allergies indicates no known allergies.  Home Medications   Prior to Admission medications   Medication Sig Start Date End Date Taking? Authorizing Provider  calcium-vitamin D (OSCAL WITH D) 500-200 MG-UNIT per tablet Take 1 tablet by mouth daily with breakfast.    Historical Provider, MD  cephALEXin (KEFLEX) 500 MG capsule Take 1 capsule (500 mg total) by mouth 2 (two) times daily. 12/05/13   Shon Batonourtney F Horton, MD  donepezil (ARICEPT) 10 MG tablet Take 10 mg by mouth every morning.    Historical Provider, MD  ibuprofen (ADVIL,MOTRIN) 200 MG tablet Take 400 mg by mouth every 6 (six) hours as needed for pain.     Historical Provider, MD  lisinopril (PRINIVIL,ZESTRIL) 10 MG tablet Take 1 tablet (10 mg total) by mouth daily. 03/11/13   Myra RudeJeremy E Schmitz, MD  memantine (NAMENDA) 10 MG tablet Take 1 tablet (10 mg total) by mouth 2 (two) times daily. 03/06/13   Levert FeinsteinYijun Yan, MD  sertraline (ZOLOFT) 50 MG tablet Take 1 tablet (50 mg total) by mouth daily. 03/06/13   Levert FeinsteinYijun Yan, MD   BP 170/88  mmHg  Pulse 64  Temp(Src) 98.2 F (36.8 C) (Oral)  SpO2 100% Physical Exam  Constitutional: She appears well-developed and well-nourished. No distress.  HENT:  Head: Normocephalic and atraumatic.    Eyes: Conjunctivae and EOM are normal.  Neck: Neck supple. No spinous process tenderness and no muscular tenderness present. No rigidity. No edema, no erythema and normal range of motion present.  Patient has difficulty following commands, but does range the neck completely, has no ttp about the mid or lateral posterior neck.  Cardiovascular: Normal rate and regular rhythm.   Pulmonary/Chest: Effort normal and breath sounds normal. No stridor. No respiratory distress.  Abdominal: She exhibits no distension.  Musculoskeletal: She exhibits no edema.  Neurological: She is alert. No cranial nerve deficit.  MAES, has facial sym, speech is clear. Patient is disoriented, inconsistent and tangential in speech pattern.  Skin: Skin is warm and dry.  Psychiatric: Her speech is delayed and tangential. She is withdrawn. Thought content is delusional. Cognition and memory are impaired.  Nursing note and vitals reviewed.   ED Course  Procedures (including critical care time)   O2- 99%ra, nml  BP 170 / 80  Update: Patient in no distress.  MDM   Patient presents from nursing home after a possible fall.  Patient is awake, alert, moving all  to be spontaneously, and the Lipitor, interactive.  She is not on anticoagulants, has no pain, no evidence for neurologic disruption, and after a period of monitoring in the emergency department, was returned to her nursing facility. Given the absence of anticoagulants, the absence of neurologic findings, the patient does have dementia, CT imaging was not indicated.    Gerhard Munchobert Jyllian Haynie, MD 03/31/14 0830

## 2014-04-17 ENCOUNTER — Encounter (HOSPITAL_COMMUNITY): Payer: Self-pay | Admitting: *Deleted

## 2014-04-17 ENCOUNTER — Emergency Department (HOSPITAL_COMMUNITY)
Admission: EM | Admit: 2014-04-17 | Discharge: 2014-04-17 | Disposition: A | Discharge status: Home / Self Care | Payer: Medicare HMO | Attending: Emergency Medicine | Admitting: Emergency Medicine

## 2014-04-17 ENCOUNTER — Emergency Department (HOSPITAL_COMMUNITY): Payer: Medicare HMO

## 2014-04-17 DIAGNOSIS — F0390 Unspecified dementia without behavioral disturbance: Secondary | ICD-10-CM | POA: Insufficient documentation

## 2014-04-17 DIAGNOSIS — X58XXXA Exposure to other specified factors, initial encounter: Secondary | ICD-10-CM | POA: Diagnosis not present

## 2014-04-17 DIAGNOSIS — Z79899 Other long term (current) drug therapy: Secondary | ICD-10-CM | POA: Insufficient documentation

## 2014-04-17 DIAGNOSIS — Y9389 Activity, other specified: Secondary | ICD-10-CM | POA: Diagnosis not present

## 2014-04-17 DIAGNOSIS — S01552A Open bite of oral cavity, initial encounter: Secondary | ICD-10-CM | POA: Diagnosis not present

## 2014-04-17 DIAGNOSIS — Y998 Other external cause status: Secondary | ICD-10-CM | POA: Diagnosis not present

## 2014-04-17 DIAGNOSIS — Y9289 Other specified places as the place of occurrence of the external cause: Secondary | ICD-10-CM | POA: Insufficient documentation

## 2014-04-17 DIAGNOSIS — I10 Essential (primary) hypertension: Secondary | ICD-10-CM | POA: Insufficient documentation

## 2014-04-17 DIAGNOSIS — Z792 Long term (current) use of antibiotics: Secondary | ICD-10-CM | POA: Insufficient documentation

## 2014-04-17 DIAGNOSIS — R569 Unspecified convulsions: Secondary | ICD-10-CM | POA: Diagnosis not present

## 2014-04-17 LAB — URINALYSIS, ROUTINE W REFLEX MICROSCOPIC
Bilirubin Urine: NEGATIVE
Glucose, UA: NEGATIVE mg/dL
HGB URINE DIPSTICK: NEGATIVE
Ketones, ur: NEGATIVE mg/dL
Nitrite: NEGATIVE
PROTEIN: 30 mg/dL — AB
SPECIFIC GRAVITY, URINE: 1.02 (ref 1.005–1.030)
UROBILINOGEN UA: 0.2 mg/dL (ref 0.0–1.0)
pH: 5 (ref 5.0–8.0)

## 2014-04-17 LAB — CBC WITH DIFFERENTIAL/PLATELET
Basophils Absolute: 0 10*3/uL (ref 0.0–0.1)
Basophils Relative: 1 % (ref 0–1)
Eosinophils Absolute: 0.2 10*3/uL (ref 0.0–0.7)
Eosinophils Relative: 4 % (ref 0–5)
HCT: 37.6 % (ref 36.0–46.0)
Hemoglobin: 11.9 g/dL — ABNORMAL LOW (ref 12.0–15.0)
LYMPHS ABS: 1.9 10*3/uL (ref 0.7–4.0)
LYMPHS PCT: 30 % (ref 12–46)
MCH: 25.4 pg — ABNORMAL LOW (ref 26.0–34.0)
MCHC: 31.6 g/dL (ref 30.0–36.0)
MCV: 80.3 fL (ref 78.0–100.0)
Monocytes Absolute: 0.4 10*3/uL (ref 0.1–1.0)
Monocytes Relative: 6 % (ref 3–12)
NEUTROS PCT: 59 % (ref 43–77)
Neutro Abs: 3.7 10*3/uL (ref 1.7–7.7)
PLATELETS: 236 10*3/uL (ref 150–400)
RBC: 4.68 MIL/uL (ref 3.87–5.11)
RDW: 15.1 % (ref 11.5–15.5)
WBC: 6.2 10*3/uL (ref 4.0–10.5)

## 2014-04-17 LAB — URINE MICROSCOPIC-ADD ON

## 2014-04-17 LAB — COMPREHENSIVE METABOLIC PANEL
ALT: 26 U/L (ref 0–35)
AST: 29 U/L (ref 0–37)
Albumin: 3.5 g/dL (ref 3.5–5.2)
Alkaline Phosphatase: 140 U/L — ABNORMAL HIGH (ref 39–117)
Anion gap: 17 — ABNORMAL HIGH (ref 5–15)
BUN: 23 mg/dL (ref 6–23)
CO2: 23 meq/L (ref 19–32)
Calcium: 9.5 mg/dL (ref 8.4–10.5)
Chloride: 104 mEq/L (ref 96–112)
Creatinine, Ser: 1.2 mg/dL — ABNORMAL HIGH (ref 0.50–1.10)
GFR, EST AFRICAN AMERICAN: 56 mL/min — AB (ref >90)
GFR, EST NON AFRICAN AMERICAN: 48 mL/min — AB (ref >90)
GLUCOSE: 97 mg/dL (ref 70–99)
POTASSIUM: 3.9 meq/L (ref 3.7–5.3)
SODIUM: 144 meq/L (ref 137–147)
Total Bilirubin: 0.4 mg/dL (ref 0.3–1.2)
Total Protein: 7.2 g/dL (ref 6.0–8.3)

## 2014-04-17 LAB — I-STAT TROPONIN, ED: Troponin i, poc: 0 ng/mL (ref 0.00–0.08)

## 2014-04-17 LAB — CBG MONITORING, ED: GLUCOSE-CAPILLARY: 90 mg/dL (ref 70–99)

## 2014-04-17 MED ORDER — LEVETIRACETAM IN NACL 1000 MG/100ML IV SOLN
1000.0000 mg | Freq: Once | INTRAVENOUS | Status: AC
  Administered 2014-04-17: 1000 mg via INTRAVENOUS
  Filled 2014-04-17: qty 100

## 2014-04-17 MED ORDER — LEVETIRACETAM 500 MG PO TABS: 500.0000 mg | ORAL_TABLET | Freq: Two times a day (BID) | ORAL | Status: DC

## 2014-04-17 NOTE — Discharge Instructions (Signed)
Follow up with Neurology  Seizure, Adult A seizure is abnormal electrical activity in the brain. Seizures usually last from 30 seconds to 2 minutes. There are various types of seizures. Before a seizure, you may have a warning sensation (aura) that a seizure is about to occur. An aura may include the following symptoms:   Fear or anxiety.  Nausea.  Feeling like the room is spinning (vertigo).  Vision changes, such as seeing flashing lights or spots. Common symptoms during a seizure include:  A change in attention or behavior (altered mental status).  Convulsions with rhythmic jerking movements.  Drooling.  Rapid eye movements.  Grunting.  Loss of bladder and bowel control.  Bitter taste in the mouth.  Tongue biting. After a seizure, you may feel confused and sleepy. You may also have an injury resulting from convulsions during the seizure. HOME CARE INSTRUCTIONS   If you are given medicines, take them exactly as prescribed by your health care provider.  Keep all follow-up appointments as directed by your health care provider.  Do not swim or drive or engage in risky activity during which a seizure could cause further injury to you or others until your health care provider says it is OK.  Get adequate rest.  Teach friends and family what to do if you have a seizure. They should:  Lay you on the ground to prevent a fall.  Put a cushion under your head.  Loosen any tight clothing around your neck.  Turn you on your side. If vomiting occurs, this helps keep your airway clear.  Stay with you until you recover.  Know whether or not you need emergency care. SEEK IMMEDIATE MEDICAL CARE IF:  The seizure lasts longer than 5 minutes.  The seizure is severe or you do not wake up immediately after the seizure.  You have an altered mental status after the seizure.  You are having more frequent or worsening seizures. Someone should drive you to the emergency department  or call local emergency services (911 in [place] MAKE SURE YOU:  Understand these instructions.  Will watch your condition.  Will get help right away if you are not doing well or get worse. Document Released: 05/11/2000 Document Revised: 03/04/2013 Document Reviewed: 12/24/2012 [facility] Patient Information 2015 [place], [place]. This information is not intended to replace advice given to you by your health care provider. Make sure you discuss any questions you have with your health care provider.

## 2014-04-17 NOTE — ED Notes (Signed)
Pt dressed & placed at nurses station

## 2014-04-17 NOTE — ED Notes (Signed)
Pt attempting to get out of bed x 2, pt moved to room close to nurses station

## 2014-04-17 NOTE — ED Notes (Signed)
CBG:90 

## 2014-04-17 NOTE — ED Notes (Signed)
This NT went in to put pt on a bedpan for a U/A, but pt's brief was soaked and pt said that she did not have to urinate at this time.

## 2014-04-17 NOTE — ED Provider Notes (Signed)
CSN: [id number]     Arrival date & time 04/17/14  [id number] History   First MD Initiated Contact with Patient 04/17/14 0935     Chief Complaint  Patient presents with  . Seizures   Level V caveat due to dementia  (Consider location/radiation/quality/duration/timing/severity/associated sxs/prior Treatment) Patient is a 58 y.o. female presenting with seizures.  Seizures  patient with early onset dementia sent from nursing home. Has had at least one episode of seizure in the past. Reportedly had 2 minute seizure today. Witnessed by staff. Some confusion after. She is baseline demented.  Past Medical History  Diagnosis Date  . Hypertension   . Dementia    History reviewed. No pertinent past surgical history. No family history on file. History  Substance Use Topics  . Smoking status: Never Smoker   . Smokeless tobacco: Never Used  . Alcohol Use: No   OB History    No data available     Review of Systems  Unable to perform ROS Neurological: Positive for seizures.      Allergies  Review of patient's allergies indicates no known allergies.  Home Medications   Prior to Admission medications   Medication Sig Start Date End Date Taking? Authorizing Provider  acetaminophen (TYLENOL) 500 MG tablet Take 500 mg by mouth every 4 (four) hours as needed for fever.   Yes Historical Provider, MD  ALPRAZolam (XANAX) 0.25 MG tablet Take 0.25 mg by mouth every 8 (eight) hours as needed for anxiety.   Yes Historical Provider, MD  Alum & Mag Hydroxide-Simeth (GERI-LANTA PO) Take 30 mLs by mouth every 6 (six) hours as needed (heartburn/indigestion).   Yes Historical Provider, MD  atorvastatin (LIPITOR) 20 MG tablet Take 20 mg by mouth daily. At 5pm   Yes Historical Provider, MD  brimonidine (ALPHAGAN P) 0.1 % SOLN Place 1 drop into both eyes 2 (two) times daily.   Yes Historical Provider, MD  Calcium Carbonate-Vitamin D (CALCIUM-D) 600-400 MG-UNIT TABS Take 1 tablet by mouth daily.   Yes  Historical Provider, MD  donepezil (ARICEPT) 10 MG tablet Take 10 mg by mouth at bedtime.    Yes Historical Provider, MD  guaiFENesin (ROBITUSSIN) 100 MG/5ML liquid Take 200 mg by mouth every 6 (six) hours as needed for cough.   Yes Historical Provider, MD  lisinopril-hydrochlorothiazide (PRINZIDE,ZESTORETIC) 20-12.5 MG per tablet Take 1 tablet by mouth daily.   Yes Historical Provider, MD  loperamide (IMODIUM) 2 MG capsule Take 2 mg by mouth as needed for diarrhea or loose stools.   Yes Historical Provider, MD  magnesium hydroxide (MILK OF MAGNESIA) 400 MG/5ML suspension Take 30 mLs by mouth at bedtime as needed for mild constipation.   Yes Historical Provider, MD  memantine (NAMENDA) 10 MG tablet Take 1 tablet (10 mg total) by mouth 2 (two) times daily. Patient taking differently: Take 10 mg by mouth daily.  03/06/13  Yes Levert FeinsteinYijun Yan, MD  mirtazapine (REMERON) 15 MG tablet Take 15 mg by mouth at bedtime.   Yes Historical Provider, MD  neomycin-bacitracin-polymyxin (NEOSPORIN) [phone] ointment Apply 1 application topically See admin instructions. Apply ointment as needed until healed.   Yes Historical Provider, MD  sertraline (ZOLOFT) 50 MG tablet Take 1 tablet (50 mg total) by mouth daily. 03/06/13  Yes Levert FeinsteinYijun Yan, MD  levETIRAcetam (KEPPRA) 500 MG tablet Take 1 tablet (500 mg total) by mouth 2 (two) times daily. 04/17/14   Juliet RudeNathan R. Seif Teichert, MD   BP 125/95 mmHg  Pulse 63  Temp(Src) 98.7 F (  37.1 C) (Oral)  Resp 18  Ht 5\' 5"  (1.651 m)  Wt 140 lb (63.504 kg)  BMI 23.30 kg/m2  SpO2 100% Physical Exam  Constitutional: She appears well-developed and well-nourished.  HENT:  Head: Normocephalic.  Bite to left side of tongue.  Eyes: Pupils are equal, round, and reactive to light.  Cardiovascular: Normal rate.   Pulmonary/Chest: Effort normal and breath sounds normal.  Abdominal: Soft. There is no tenderness.  Neurological: She is alert.  Awake and some confused speech. Appears to be near  baseline per notes. Will follow commands.  Skin: Skin is warm.    ED Course  Procedures (including critical care time) Labs Review Labs Reviewed  CBC WITH DIFFERENTIAL - Abnormal; Notable for the following:    Hemoglobin 11.9 (*)    MCH 25.4 (*)    All other components within normal limits  COMPREHENSIVE METABOLIC PANEL - Abnormal; Notable for the following:    Creatinine, Ser 1.20 (*)    Alkaline Phosphatase 140 (*)    GFR calc non Af Amer 48 (*)    GFR calc Af Amer 56 (*)    Anion gap 17 (*)    All other components within normal limits  URINALYSIS, ROUTINE W REFLEX MICROSCOPIC - Abnormal; Notable for the following:    Protein, ur 30 (*)    Leukocytes, UA SMALL (*)    All other components within normal limits  URINE MICROSCOPIC-ADD ON - Abnormal; Notable for the following:    Bacteria, UA MANY (*)    All other components within normal limits  CBG MONITORING, ED  I-STAT TROPOININ, ED    Imaging Review Dg Chest Portable 1 View  04/17/2014   CLINICAL DATA:  Seizure, history hypertension, dementia  EXAM: PORTABLE CHEST - 1 VIEW  COMPARISON:  Portable exam 1019 hr compared 03/10/2013  FINDINGS: Normal heart size, mediastinal contours, and pulmonary vascularity.  Lungs clear.  No pleural effusion or pneumothorax.  Scattered endplate spur formation thoracic spine.  Minimal spurring versus calcified loose body at RIGHT glenohumeral joint unchanged.  IMPRESSION: No acute abnormalities.   Electronically Signed   By: Ulyses SouthwardMark  Boles M.D.   On: 04/17/2014 10:29     EKG Interpretation None      MDM   Final diagnoses:  Seizure    Patient with seizure. Has had history of same but not currently on treatment. Appears to be at her baseline. No evidence of trauma. Discussed with neurology and loaded with IV Keppra and will start oral Keppra for home. Will need to follow-up with PCP and possibly neurology.    Juliet RudeNathan R. Rubin PayorPickering, MD 04/17/14 1243

## 2014-04-17 NOTE — ED Notes (Signed)
[facility], per report pt had witnessed seizure lasting estimated 2 mins, with urine incontinence, - tongue injury, pt hx of seizures & dementia, pt at neuro baseline-alert to verbal stimuli, follows commands, pt has incomprehensible speech at times, & disoriented to situation, place & time per assisted living staff, no falls or head injury

## 2014-05-15 ENCOUNTER — Emergency Department (HOSPITAL_COMMUNITY)
Admission: EM | Admit: 2014-05-15 | Discharge: 2014-05-15 | Disposition: A | Discharge status: Home / Self Care | Payer: Medicare HMO | Attending: Emergency Medicine | Admitting: Emergency Medicine

## 2014-05-15 ENCOUNTER — Emergency Department (HOSPITAL_COMMUNITY): Payer: Medicare HMO

## 2014-05-15 ENCOUNTER — Encounter (HOSPITAL_COMMUNITY): Payer: Self-pay | Admitting: Emergency Medicine

## 2014-05-15 DIAGNOSIS — I10 Essential (primary) hypertension: Secondary | ICD-10-CM | POA: Diagnosis not present

## 2014-05-15 DIAGNOSIS — Y998 Other external cause status: Secondary | ICD-10-CM | POA: Diagnosis not present

## 2014-05-15 DIAGNOSIS — W1830XA Fall on same level, unspecified, initial encounter: Secondary | ICD-10-CM | POA: Insufficient documentation

## 2014-05-15 DIAGNOSIS — Y9389 Activity, other specified: Secondary | ICD-10-CM | POA: Diagnosis not present

## 2014-05-15 DIAGNOSIS — Y92128 Other place in nursing home as the place of occurrence of the external cause: Secondary | ICD-10-CM | POA: Diagnosis not present

## 2014-05-15 DIAGNOSIS — S0990XA Unspecified injury of head, initial encounter: Secondary | ICD-10-CM | POA: Insufficient documentation

## 2014-05-15 DIAGNOSIS — Z792 Long term (current) use of antibiotics: Secondary | ICD-10-CM | POA: Insufficient documentation

## 2014-05-15 DIAGNOSIS — Z79899 Other long term (current) drug therapy: Secondary | ICD-10-CM | POA: Insufficient documentation

## 2014-05-15 DIAGNOSIS — G40909 Epilepsy, unspecified, not intractable, without status epilepticus: Secondary | ICD-10-CM | POA: Insufficient documentation

## 2014-05-15 DIAGNOSIS — F0390 Unspecified dementia without behavioral disturbance: Secondary | ICD-10-CM | POA: Diagnosis not present

## 2014-05-15 DIAGNOSIS — R569 Unspecified convulsions: Secondary | ICD-10-CM

## 2014-05-15 LAB — BASIC METABOLIC PANEL
ANION GAP: 15 (ref 5–15)
BUN: 16 mg/dL (ref 6–23)
CHLORIDE: 103 meq/L (ref 96–112)
CO2: 23 mEq/L (ref 19–32)
Calcium: 10 mg/dL (ref 8.4–10.5)
Creatinine, Ser: 1.07 mg/dL (ref 0.50–1.10)
GFR calc non Af Amer: 56 mL/min — ABNORMAL LOW (ref >90)
GFR, EST AFRICAN AMERICAN: 65 mL/min — AB (ref >90)
Glucose, Bld: 103 mg/dL — ABNORMAL HIGH (ref 70–99)
Potassium: 3.9 mEq/L (ref 3.7–5.3)
SODIUM: 141 meq/L (ref 137–147)

## 2014-05-15 LAB — CBC WITH DIFFERENTIAL/PLATELET
BASOS ABS: 0.1 10*3/uL (ref 0.0–0.1)
Basophils Relative: 2 % — ABNORMAL HIGH (ref 0–1)
Eosinophils Absolute: 0.1 10*3/uL (ref 0.0–0.7)
Eosinophils Relative: 1 % (ref 0–5)
HCT: 38.7 % (ref 36.0–46.0)
HEMOGLOBIN: 12.3 g/dL (ref 12.0–15.0)
Lymphocytes Relative: 19 % (ref 12–46)
Lymphs Abs: 1.1 10*3/uL (ref 0.7–4.0)
MCH: 25.4 pg — ABNORMAL LOW (ref 26.0–34.0)
MCHC: 31.8 g/dL (ref 30.0–36.0)
MCV: 79.8 fL (ref 78.0–100.0)
MONOS PCT: 7 % (ref 3–12)
Monocytes Absolute: 0.4 10*3/uL (ref 0.1–1.0)
NEUTROS ABS: 4.2 10*3/uL (ref 1.7–7.7)
NEUTROS PCT: 71 % (ref 43–77)
PLATELETS: 218 10*3/uL (ref 150–400)
RBC: 4.85 MIL/uL (ref 3.87–5.11)
RDW: 15.1 % (ref 11.5–15.5)
WBC: 5.9 10*3/uL (ref 4.0–10.5)

## 2014-05-15 MED ORDER — LEVETIRACETAM 500 MG PO TABS
1000.0000 mg | ORAL_TABLET | Freq: Once | ORAL | Status: AC
  Administered 2014-05-15: 1000 mg via ORAL
  Filled 2014-05-15: qty 2

## 2014-05-15 MED ORDER — HYDROCHLOROTHIAZIDE 12.5 MG PO CAPS
12.5000 mg | ORAL_CAPSULE | Freq: Every day | ORAL | Status: DC
  Administered 2014-05-15: 12.5 mg via ORAL
  Filled 2014-05-15: qty 1

## 2014-05-15 MED ORDER — LISINOPRIL 20 MG PO TABS
20.0000 mg | ORAL_TABLET | Freq: Every day | ORAL | Status: DC
  Administered 2014-05-15: 20 mg via ORAL
  Filled 2014-05-15: qty 1

## 2014-05-15 MED ORDER — LISINOPRIL-HYDROCHLOROTHIAZIDE 20-12.5 MG PO TABS: 1.0000 | ORAL_TABLET | Freq: Every day | ORAL | Status: DC

## 2014-05-15 MED ORDER — LEVETIRACETAM IN NACL 1000 MG/100ML IV SOLN
1000.0000 mg | Freq: Once | INTRAVENOUS | Status: DC
  Filled 2014-05-15: qty 100

## 2014-05-15 NOTE — ED Notes (Signed)
Patient transported to CT 

## 2014-05-15 NOTE — ED Provider Notes (Signed)
CSN: [id number]     Arrival date & time 05/15/14  0909 History   First MD Initiated Contact with Patient 05/15/14 0911     Chief Complaint  Patient presents with  . Seizures     (Consider location/radiation/quality/duration/timing/severity/associated sxs/prior Treatment) HPI Comments: Patient is a 58 year old female with a past medical history of early onset dementia and hypertension who [facility] the ED [facility] after having a witnessed seizure prior to arrival. History provided by EMS. Patient had a 1 minute tonic clonic seizure where she fell and hit her head. Patient was post ictal on EMS arrival. Patient has a known seizure disorder and is taking 500mg  Keppra BID. Patient has missed multiple doses over the past week due to the facility not having the medication. Patient is a poor historian and unable to contribute any additional details.    Past Medical History  Diagnosis Date  . Hypertension   . Dementia    No past surgical history on file. No family history on file. History  Substance Use Topics  . Smoking status: Never Smoker   . Smokeless tobacco: Never Used  . Alcohol Use: No   OB History    No data available     Review of Systems  Constitutional: Negative for fever, chills and fatigue.  HENT: Negative for trouble swallowing.   Eyes: Negative for visual disturbance.  Respiratory: Negative for shortness of breath.   Cardiovascular: Negative for chest pain and palpitations.  Gastrointestinal: Negative for nausea, vomiting, abdominal pain and diarrhea.  Genitourinary: Negative for dysuria and difficulty urinating.  Musculoskeletal: Negative for arthralgias and neck pain.  Skin: Negative for color change.  Neurological: Positive for seizures. Negative for dizziness and weakness.  Psychiatric/Behavioral: Negative for dysphoric mood.      Allergies  Review of patient's allergies indicates no known allergies.  Home Medications   Prior to  Admission medications   Medication Sig Start Date End Date Taking? Authorizing Provider  acetaminophen (TYLENOL) 500 MG tablet Take 500 mg by mouth every 4 (four) hours as needed for fever.    Historical Provider, MD  ALPRAZolam Prudy Feeler(XANAX) 0.25 MG tablet Take 0.25 mg by mouth every 8 (eight) hours as needed for anxiety.    Historical Provider, MD  Alum & Mag Hydroxide-Simeth (GERI-LANTA PO) Take 30 mLs by mouth every 6 (six) hours as needed (heartburn/indigestion).    Historical Provider, MD  atorvastatin (LIPITOR) 20 MG tablet Take 20 mg by mouth daily. At 5pm    Historical Provider, MD  brimonidine (ALPHAGAN P) 0.1 % SOLN Place 1 drop into both eyes 2 (two) times daily.    Historical Provider, MD  Calcium Carbonate-Vitamin D (CALCIUM-D) 600-400 MG-UNIT TABS Take 1 tablet by mouth daily.    Historical Provider, MD  donepezil (ARICEPT) 10 MG tablet Take 10 mg by mouth at bedtime.     Historical Provider, MD  guaiFENesin (ROBITUSSIN) 100 MG/5ML liquid Take 200 mg by mouth every 6 (six) hours as needed for cough.    Historical Provider, MD  levETIRAcetam (KEPPRA) 500 MG tablet Take 1 tablet (500 mg total) by mouth 2 (two) times daily. 04/17/14   Juliet RudeNathan R. Pickering, MD  lisinopril-hydrochlorothiazide (PRINZIDE,ZESTORETIC) 20-12.5 MG per tablet Take 1 tablet by mouth daily.    Historical Provider, MD  loperamide (IMODIUM) 2 MG capsule Take 2 mg by mouth as needed for diarrhea or loose stools.    Historical Provider, MD  magnesium hydroxide (MILK OF MAGNESIA) 400 MG/5ML  suspension Take 30 mLs by mouth at bedtime as needed for mild constipation.    Historical Provider, MD  memantine (NAMENDA) 10 MG tablet Take 1 tablet (10 mg total) by mouth 2 (two) times daily. Patient taking differently: Take 10 mg by mouth daily.  03/06/13   Levert Feinstein, MD  mirtazapine (REMERON) 15 MG tablet Take 15 mg by mouth at bedtime.    Historical Provider, MD  neomycin-bacitracin-polymyxin (NEOSPORIN) [phone] ointment Apply 1  application topically See admin instructions. Apply ointment as needed until healed.    Historical Provider, MD  sertraline (ZOLOFT) 50 MG tablet Take 1 tablet (50 mg total) by mouth daily. 03/06/13   Levert Feinstein, MD   SpO2 99% Physical Exam  Constitutional: She appears well-developed and well-nourished. No distress.  HENT:  Head: Normocephalic and atraumatic.  Eyes: Conjunctivae and EOM are normal. Pupils are equal, round, and reactive to light.  Neck: Normal range of motion.  Cardiovascular: Normal rate and regular rhythm.  Exam reveals no gallop and no friction rub.   No murmur heard. Pulmonary/Chest: Effort normal and breath sounds normal. She has no wheezes. She has no rales. She exhibits no tenderness.  Abdominal: Soft. She exhibits no distension. There is no tenderness. There is no rebound.  Musculoskeletal: Normal range of motion.  Neurological: She is alert. No cranial nerve deficit. Coordination normal.  Moves limbs without ataxia. Alert but not oriented secondary to dementia.   Skin: Skin is warm and dry.  Psychiatric: She has a normal mood and affect. Her behavior is normal.  Nursing note and vitals reviewed.   ED Course  Procedures (including critical care time) Labs Review Labs Reviewed  CBC WITH DIFFERENTIAL - Abnormal; Notable for the following:    MCH 25.4 (*)    Basophils Relative 2 (*)    All other components within normal limits  BASIC METABOLIC PANEL - Abnormal; Notable for the following:    Glucose, Bld 103 (*)    GFR calc non Af Amer 56 (*)    GFR calc Af Amer 65 (*)    All other components within normal limits    Imaging Review Ct Head Wo Contrast  05/15/2014   CLINICAL DATA:  Seizure with fall  EXAM: CT HEAD WITHOUT CONTRAST  CT MAXILLOFACIAL WITHOUT CONTRAST  TECHNIQUE: Multidetector CT imaging of the head and maxillofacial structures were performed using the standard protocol without intravenous contrast. Multiplanar CT image reconstructions of the  maxillofacial structures were also generated.  COMPARISON:  None.  FINDINGS: CT HEAD FINDINGS  There is moderate diffuse atrophy, stable. There is no intracranial mass, hemorrhage, extra-axial fluid collection, or midline shift. There is patchy small vessel disease in the centra semiovale bilaterally. Elsewhere gray-white compartments appear normal. No acute infarct apparent.  There is a sizable posterior scalp hematoma, primarily over the right parietal bone. Bony calvarium appears intact. Mastoid air cells are clear.  CT MAXILLOFACIAL FINDINGS  There is no demonstrable fracture or dislocation. No intraorbital lesions are identified. There is a retention cyst in the inferior left maxillary antrum. Other paranasal sinuses are clear. Ostiomeatal unit complexes are patent bilaterally. The nares are patent bilaterally. There is slight rightward deviation of the nasal septum. There is a dentigerous cyst in the right medial alveolar ridge.  IMPRESSION: CT head: Atrophy with patchy periventricular small vessel disease. No intracranial mass, hemorrhage, or extra-axial fluid. No acute infarct. Sizable superior, posterior scalp hematoma.  CT maxillofacial: No fracture or dislocation. No intraorbital lesions. Paranasal sinuses are clear  except for a small retention cyst in the inferior left maxillary antrum. Ostiomeatal unit complexes are patent bilaterally. Slight rightward deviation of the nasal septum. Dentigerous cyst in medial right alveolar ridge.   Electronically Signed   By: Bretta BangWilliam  Woodruff M.D.   On: 05/15/2014 10:21   Ct Maxillofacial Wo Cm  05/15/2014   CLINICAL DATA:  Seizure with fall  EXAM: CT HEAD WITHOUT CONTRAST  CT MAXILLOFACIAL WITHOUT CONTRAST  TECHNIQUE: Multidetector CT imaging of the head and maxillofacial structures were performed using the standard protocol without intravenous contrast. Multiplanar CT image reconstructions of the maxillofacial structures were also generated.  COMPARISON:  None.   FINDINGS: CT HEAD FINDINGS  There is moderate diffuse atrophy, stable. There is no intracranial mass, hemorrhage, extra-axial fluid collection, or midline shift. There is patchy small vessel disease in the centra semiovale bilaterally. Elsewhere gray-white compartments appear normal. No acute infarct apparent.  There is a sizable posterior scalp hematoma, primarily over the right parietal bone. Bony calvarium appears intact. Mastoid air cells are clear.  CT MAXILLOFACIAL FINDINGS  There is no demonstrable fracture or dislocation. No intraorbital lesions are identified. There is a retention cyst in the inferior left maxillary antrum. Other paranasal sinuses are clear. Ostiomeatal unit complexes are patent bilaterally. The nares are patent bilaterally. There is slight rightward deviation of the nasal septum. There is a dentigerous cyst in the right medial alveolar ridge.  IMPRESSION: CT head: Atrophy with patchy periventricular small vessel disease. No intracranial mass, hemorrhage, or extra-axial fluid. No acute infarct. Sizable superior, posterior scalp hematoma.  CT maxillofacial: No fracture or dislocation. No intraorbital lesions. Paranasal sinuses are clear except for a small retention cyst in the inferior left maxillary antrum. Ostiomeatal unit complexes are patent bilaterally. Slight rightward deviation of the nasal septum. Dentigerous cyst in medial right alveolar ridge.   Electronically Signed   By: Bretta BangWilliam  Woodruff M.D.   On: 05/15/2014 10:21     EKG Interpretation None      MDM   Final diagnoses:  Seizure  Head trauma    9:13 AM Labs pending. Vitals stable and patient afebrile.   12:05 PM Patient's labs and CT head and face unremarkable for acute changes. Burna MortimerWanda, RN with Case Management [facility] and was able to arrange for the patient to receive her medications as ordered. [facility]    Emilia BeckKaitlyn Minna Dumire, PA-C 05/15/14 1207  Nelia Shiobert L  Beaton, MD 05/16/14 [phone]

## 2014-05-15 NOTE — Care Management (Signed)
ED CM consulted by Dr. Radford PaxBeaton, [facility] and staff reported witness seizure lasting 1 min.  MAR sent with patient reflected missing doses of seizure medications due to medication not being available in the facility. Contacted facility spoke with [employer] Lineberry [phone] [facility] Administrator on call. She stated, she was not aware of medication not given, but she was aware the order needed to be renewed. She states, order was renewed and  the medication is available in the facility when patient returns. Patient will return to facility once patient is medically cleared for discharged. Updated Langston MaskerK.  Szekalsi PA-C, Melida QuitterLeo Fitcht CSW  and Museum/gallery conservatorBecky RN on [facility]. No further ED CM needs identified.

## 2014-05-15 NOTE — ED Notes (Signed)
Pt placed in recliner by nurses station.

## 2014-05-15 NOTE — ED Notes (Signed)
Burna MortimerWanda, CM, advised she notified Bonnetta BarryKaitlin S, [place], [address] does [facility] rx for pt to receive Keppra and does have med available.

## 2014-05-15 NOTE — ED Notes (Signed)
[facility] MESSAGE W[facility] [phone] - THAT PT'S JACKET WAS LEFT IN ED AND REQUESTED FOR HER TO NOTIFY PTAR WHEN ARRIVE W/PT.

## 2014-05-15 NOTE — ED Notes (Signed)
[facility].  EMS reports staff there reports hearing pt fall approx 0800, reports staff believe pt hit her head with fall.  Staff reports pt had a seizure lasting approz. 1 minute.  EMS states pt post-ictal upon their arrival.  Pt placed in cervical collar, with head boards on LSB.  EMS pt became agitated; collar was D/C.  Pt calm and cooperative, denies pain.  Full ROM, NAD noted at this time.  Pt AxO x1

## 2014-05-15 NOTE — Discharge Instructions (Signed)
Take medication as prescribed. Return to the ED with worsening or concerning symptoms. Follow up with your doctor.

## 2014-05-15 NOTE — Progress Notes (Signed)
CSW and Nurse CM met with this 58 y/o, female patient briefly in the hallway.  After reviewing the med sheet from her facility it was noted she had missed her evening dose of Kepra for the last three days.   Nurse CM contacted the admin on call, Lavinia Sharps, at the patient's facility [facility].  The admin stated the medication order had expired and they could not contact prescribing physician.  This CSW forwarded this incident to the supervisor.  CSW signing off, available as needed.  [facility] [place] Richardo Priest ED CSW [phone]

## 2014-05-16 ENCOUNTER — Encounter (HOSPITAL_COMMUNITY): Payer: Self-pay | Admitting: Emergency Medicine

## 2014-05-16 ENCOUNTER — Emergency Department (HOSPITAL_COMMUNITY)
Admission: EM | Admit: 2014-05-16 | Discharge: 2014-05-17 | Disposition: A | Discharge status: Home / Self Care | Payer: Medicare HMO | Attending: Emergency Medicine | Admitting: Emergency Medicine

## 2014-05-16 DIAGNOSIS — W19XXXA Unspecified fall, initial encounter: Secondary | ICD-10-CM | POA: Diagnosis not present

## 2014-05-16 DIAGNOSIS — Y92128 Other place in nursing home as the place of occurrence of the external cause: Secondary | ICD-10-CM | POA: Insufficient documentation

## 2014-05-16 DIAGNOSIS — Y92129 Unspecified place in nursing home as the place of occurrence of the external cause: Secondary | ICD-10-CM

## 2014-05-16 DIAGNOSIS — Z79899 Other long term (current) drug therapy: Secondary | ICD-10-CM | POA: Diagnosis not present

## 2014-05-16 DIAGNOSIS — Y9389 Activity, other specified: Secondary | ICD-10-CM | POA: Insufficient documentation

## 2014-05-16 DIAGNOSIS — I10 Essential (primary) hypertension: Secondary | ICD-10-CM | POA: Diagnosis not present

## 2014-05-16 DIAGNOSIS — F0390 Unspecified dementia without behavioral disturbance: Secondary | ICD-10-CM | POA: Diagnosis not present

## 2014-05-16 DIAGNOSIS — S0083XA Contusion of other part of head, initial encounter: Secondary | ICD-10-CM | POA: Diagnosis not present

## 2014-05-16 DIAGNOSIS — S0990XA Unspecified injury of head, initial encounter: Secondary | ICD-10-CM | POA: Diagnosis present

## 2014-05-16 DIAGNOSIS — T148XXA Other injury of unspecified body region, initial encounter: Secondary | ICD-10-CM

## 2014-05-16 DIAGNOSIS — Y998 Other external cause status: Secondary | ICD-10-CM | POA: Insufficient documentation

## 2014-05-16 NOTE — ED Notes (Signed)
Patient was in bed. Nursing staff found patient on floor with an contusion above right eyebrower. [facility]. [phone] This fall was unwitnessed

## 2014-05-16 NOTE — ED Notes (Signed)
Bed: [id number] Expected date:  Expected time:  Means of arrival:  Comments: EMS 58yo F[facility], contusion to rt eyebrow

## 2014-05-17 ENCOUNTER — Emergency Department (HOSPITAL_COMMUNITY): Payer: Medicare HMO

## 2014-05-17 NOTE — ED Provider Notes (Signed)
Pt is in a NH with dementia. Had an unwitnessed fall tonight. Patient was seen last month for seizure and was started on Keppra.  Patient is sleeping but easily awakened. She is noted to have some soft swelling over her right forehead. She states tender to palpation. Patient denies any other pain.  Medical screening examination/treatment/procedure(s) were conducted as a shared visit with non-physician practitioner(s) and myself.  I personally evaluated the patient during the encounter.   EKG Interpretation None        Ward GivensIva L Sally Menard, MD 05/17/14 [phone]

## 2014-05-17 NOTE — ED Notes (Signed)
Patient waiting on PTAR. Dispatcher could not give an ETA

## 2014-05-17 NOTE — ED Provider Notes (Signed)
CSN: [id number]     Arrival date & time 05/16/14  2318 History   First MD Initiated Contact with Patient 05/16/14 2336     Chief Complaint  Patient presents with  . Fall    Patient was in bed. Nursing staff found patient on floor with an contusion above right eyebrower. Patient is from [facility]. [phone] This fall was unwitnessed.     (Consider location/radiation/quality/duration/timing/severity/associated sxs/prior Treatment) Patient is a 58 y.o. female presenting with fall. The history is provided by the EMS personnel. No language interpreter was used.  Fall This is a new problem. The current episode started today. Associated symptoms comments: [facility] with history of significant dementia. She was found in her room on the floor after an unwitnessed fall, with a hematoma on her forehead. The patient cannot contribute to history because of dementia. Per EMS report, the patient is at her baseline mental status. No reported vomiting. The patient was awake and alert..    Past Medical History  Diagnosis Date  . Hypertension   . Dementia    History reviewed. No pertinent past surgical history. History reviewed. No pertinent family history. History  Substance Use Topics  . Smoking status: Never Smoker   . Smokeless tobacco: Never Used  . Alcohol Use: No   OB History    No data available     Review of Systems  Unable to perform ROS     Allergies  Review of patient's allergies indicates no known allergies.  Home Medications   Prior to Admission medications   Medication Sig Start Date End Date Taking? Authorizing Provider  acetaminophen (TYLENOL) 500 MG tablet Take 500 mg by mouth every 4 (four) hours as needed for fever.   Yes Historical Provider, MD  ALPRAZolam (XANAX) 0.25 MG tablet Take 0.25 mg by mouth every 8 (eight) hours as needed for anxiety.   Yes Historical Provider, MD  Alum & Mag Hydroxide-Simeth  (GERI-LANTA PO) Take 30 mLs by mouth every 6 (six) hours as needed (heartburn/indigestion).   Yes Historical Provider, MD  atorvastatin (LIPITOR) 20 MG tablet Take 20 mg by mouth daily. At 5pm   Yes Historical Provider, MD  brimonidine (ALPHAGAN P) 0.1 % SOLN Place 1 drop into both eyes 2 (two) times daily.   Yes Historical Provider, MD  Calcium Carbonate-Vitamin D (CALCIUM-D) 600-400 MG-UNIT TABS Take 1 tablet by mouth daily.   Yes Historical Provider, MD  donepezil (ARICEPT) 10 MG tablet Take 10 mg by mouth at bedtime.    Yes Historical Provider, MD  levETIRAcetam (KEPPRA) 500 MG tablet Take 1 tablet (500 mg total) by mouth 2 (two) times daily. 04/17/14  Yes Nathan R. Pickering, MD  lisinopril-hydrochlorothiazide (PRINZIDE,ZESTORETIC) 20-12.5 MG per tablet Take 1 tablet by mouth daily.   Yes Historical Provider, MD  loperamide (IMODIUM) 2 MG capsule Take 2 mg by mouth as needed for diarrhea or loose stools.   Yes Historical Provider, MD  loratadine (CLARITIN) 10 MG tablet Take 10 mg by mouth daily.   Yes Historical Provider, MD  magnesium hydroxide (MILK OF MAGNESIA) 400 MG/5ML suspension Take 30 mLs by mouth at bedtime as needed for mild constipation.   Yes Historical Provider, MD  memantine (NAMENDA) 10 MG tablet Take 1 tablet (10 mg total) by mouth 2 (two) times daily. Patient taking differently: Take 10 mg by mouth daily.  03/06/13  Yes Levert FeinsteinYijun Yan, MD  mirtazapine (REMERON) 15 MG tablet Take 15 mg  by mouth at bedtime.   Yes Historical Provider, MD  sertraline (ZOLOFT) 50 MG tablet Take 1 tablet (50 mg total) by mouth daily. 03/06/13  Yes Levert FeinsteinYijun Yan, MD  Travoprost, BAK Free, (TRAVATAN) 0.004 % SOLN ophthalmic solution Place 1 drop into both eyes at bedtime.   Yes Historical Provider, MD  guaiFENesin (ROBITUSSIN) 100 MG/5ML liquid Take 200 mg by mouth every 6 (six) hours as needed for cough.    Historical Provider, MD   BP 180/106 mmHg  Pulse 59  Temp(Src) 97.9 F (36.6 C) (Oral)  Resp 20   SpO2 100% Physical Exam  Constitutional: She appears well-developed and well-nourished. No distress.  HENT:  Hematoma over right eye brow  Eyes: Conjunctivae are normal.  Neck: Normal range of motion. Neck supple.  Cardiovascular: Normal rate.   No murmur heard. Pulmonary/Chest: Effort normal. She has no wheezes.  Abdominal: Soft. There is no tenderness.  Musculoskeletal: Normal range of motion.  Neurological: She is alert.  The patient is not oriented to place or time. Poor recognition of condition, c/w h/o dementia.    ED Course  Procedures (including critical care time) Labs Review Labs Reviewed - No data to display  Imaging Review Ct Head Wo Contrast  05/17/2014   CLINICAL DATA:  Found on floor, with contusion above the right eyebrow. Unwitnessed fall. Concern for head or cervical spine injury. Initial encounter.  EXAM: CT HEAD WITHOUT CONTRAST  CT CERVICAL SPINE WITHOUT CONTRAST  TECHNIQUE: Multidetector CT imaging of the head and cervical spine was performed following the standard protocol without intravenous contrast. Multiplanar CT image reconstructions of the cervical spine were also generated.  COMPARISON:  CT of the head performed 05/15/2014, and CT of the cervical spine from 12/05/2013  FINDINGS: CT HEAD FINDINGS  There is no evidence of acute infarction, mass lesion, or intra- or extra-axial hemorrhage on CT.  Prominence of the ventricles and sulci reflects mild to moderate cortical volume loss. 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 Mild periventricular white matter change likely reflects small vessel ischemic microangiopathy.  The brainstem and fourth ventricle are within normal limits. The basal ganglia are unremarkable in appearance. The cerebral hemispheres demonstrate grossly normal gray-white differentiation. No mass effect or midline shift is seen.  There is no evidence of fracture; a periapical abscess is incidentally noted at the  root of the right maxillary canine. The orbits are within normal limits. The paranasal sinuses and mastoid air cells are well-aerated. Soft tissue swelling is noted overlying the right frontal calvarium, and also at the posterior vertex.  CT CERVICAL SPINE FINDINGS  There is no evidence of fracture or subluxation. Vertebral bodies demonstrate normal height and alignment. Intervertebral disc spaces are preserved. Prevertebral soft tissues are within normal limits. The visualized neural foramina are grossly unremarkable. 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  The thyroid gland is unremarkable in appearance. The visualized lung apices are clear. No significant soft tissue abnormalities are seen.  IMPRESSION: 1. No evidence of traumatic intracranial injury or fracture. 2. No evidence of fracture or subluxation along the cervical spine. 3. Soft tissue swelling overlying the right frontal calvarium, and also at the posterior vertex. 4. 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 5. Mild to moderate cortical volume loss and scattered small vessel ischemic microangiopathy. 6. 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 7. Periapical abscess incidentally noted at the root of the right maxillary canine.   Electronically Signed  By: Roanna Raider M.D.   On: 05/17/2014 00:58   Ct Head Wo Contrast  05/15/2014   CLINICAL DATA:  Seizure with fall  EXAM: CT HEAD WITHOUT CONTRAST  CT MAXILLOFACIAL WITHOUT CONTRAST  TECHNIQUE: Multidetector CT imaging of the head and maxillofacial structures were performed using the standard protocol without intravenous contrast. Multiplanar CT image reconstructions of the maxillofacial structures were also generated.  COMPARISON:  None.  FINDINGS: CT HEAD FINDINGS  There is moderate diffuse atrophy, stable. There is no intracranial mass, hemorrhage, extra-axial fluid collection, or midline shift. There is patchy small vessel disease in the centra  semiovale bilaterally. Elsewhere gray-white compartments appear normal. No acute infarct apparent.  There is a sizable posterior scalp hematoma, primarily over the right parietal bone. Bony calvarium appears intact. Mastoid air cells are clear.  CT MAXILLOFACIAL FINDINGS  There is no demonstrable fracture or dislocation. No intraorbital lesions are identified. There is a retention cyst in the inferior left maxillary antrum. Other paranasal sinuses are clear. Ostiomeatal unit complexes are patent bilaterally. The nares are patent bilaterally. There is slight rightward deviation of the nasal septum. There is a dentigerous cyst in the right medial alveolar ridge.  IMPRESSION: CT head: Atrophy with patchy periventricular small vessel disease. No intracranial mass, hemorrhage, or extra-axial fluid. No acute infarct. Sizable superior, posterior scalp hematoma.  CT maxillofacial: No fracture or dislocation. No intraorbital lesions. Paranasal sinuses are clear except for a small retention cyst in the inferior left maxillary antrum. Ostiomeatal unit complexes are patent bilaterally. Slight rightward deviation of the nasal septum. Dentigerous cyst in medial right alveolar ridge.   Electronically Signed   By: Bretta Bang M.D.   On: 05/15/2014 10:21   Ct Cervical Spine Wo Contrast  05/17/2014   CLINICAL DATA:  Found on floor, with contusion above the right eyebrow. Unwitnessed fall. Concern for head or cervical spine injury. Initial encounter.  EXAM: CT HEAD WITHOUT CONTRAST  CT CERVICAL SPINE WITHOUT CONTRAST  TECHNIQUE: Multidetector CT imaging of the head and cervical spine was performed following the standard protocol without intravenous contrast. Multiplanar CT image reconstructions of the cervical spine were also generated.  COMPARISON:  CT of the head performed 05/15/2014, and CT of the cervical spine from 12/05/2013  FINDINGS: CT HEAD FINDINGS  There is no evidence of acute infarction, mass lesion, or intra-  or extra-axial hemorrhage on CT.  Prominence of the ventricles and sulci reflects mild to moderate cortical volume loss. 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 Mild periventricular white matter change likely reflects small vessel ischemic microangiopathy.  The brainstem and fourth ventricle are within normal limits. The basal ganglia are unremarkable in appearance. The cerebral hemispheres demonstrate grossly normal gray-white differentiation. No mass effect or midline shift is seen.  There is no evidence of fracture; a periapical abscess is incidentally noted at the root of the right maxillary canine. The orbits are within normal limits. The paranasal sinuses and mastoid air cells are well-aerated. Soft tissue swelling is noted overlying the right frontal calvarium, and also at the posterior vertex.  CT CERVICAL SPINE FINDINGS  There is no evidence of fracture or subluxation. Vertebral bodies demonstrate normal height and alignment. Intervertebral disc spaces are preserved. Prevertebral soft tissues are within normal limits. The visualized neural foramina are grossly unremarkable. 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  The thyroid gland is unremarkable in appearance. The visualized lung apices are clear. No significant soft tissue abnormalities are  seen.  IMPRESSION: 1. No evidence of traumatic intracranial injury or fracture. 2. No evidence of fracture or subluxation along the cervical spine. 3. Soft tissue swelling overlying the right frontal calvarium, and also at the posterior vertex. 4. 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 5. Mild to moderate cortical volume loss and scattered small vessel ischemic microangiopathy. 6. 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 7. Periapical abscess incidentally noted at the root of the right maxillary canine.   Electronically Signed   By: Roanna Raider M.D.   On: 05/17/2014 00:58   Ct  Maxillofacial Wo Cm  05/15/2014   CLINICAL DATA:  Seizure with fall  EXAM: CT HEAD WITHOUT CONTRAST  CT MAXILLOFACIAL WITHOUT CONTRAST  TECHNIQUE: Multidetector CT imaging of the head and maxillofacial structures were performed using the standard protocol without intravenous contrast. Multiplanar CT image reconstructions of the maxillofacial structures were also generated.  COMPARISON:  None.  FINDINGS: CT HEAD FINDINGS  There is moderate diffuse atrophy, stable. There is no intracranial mass, hemorrhage, extra-axial fluid collection, or midline shift. There is patchy small vessel disease in the centra semiovale bilaterally. Elsewhere gray-white compartments appear normal. No acute infarct apparent.  There is a sizable posterior scalp hematoma, primarily over the right parietal bone. Bony calvarium appears intact. Mastoid air cells are clear.  CT MAXILLOFACIAL FINDINGS  There is no demonstrable fracture or dislocation. No intraorbital lesions are identified. There is a retention cyst in the inferior left maxillary antrum. Other paranasal sinuses are clear. Ostiomeatal unit complexes are patent bilaterally. The nares are patent bilaterally. There is slight rightward deviation of the nasal septum. There is a dentigerous cyst in the right medial alveolar ridge.  IMPRESSION: CT head: Atrophy with patchy periventricular small vessel disease. No intracranial mass, hemorrhage, or extra-axial fluid. No acute infarct. Sizable superior, posterior scalp hematoma.  CT maxillofacial: No fracture or dislocation. No intraorbital lesions. Paranasal sinuses are clear except for a small retention cyst in the inferior left maxillary antrum. Ostiomeatal unit complexes are patent bilaterally. Slight rightward deviation of the nasal septum. Dentigerous cyst in medial right alveolar ridge.   Electronically Signed   By: Bretta Bang M.D.   On: 05/15/2014 10:21     EKG Interpretation None      MDM   Final diagnoses:  Fall  at nursing home  Fall  hematoma right forehead  The patient remains alert, unchanged. CT's of head and neck are negative. Discussed with neurology given unwitnessed fall could have represented a seizure as she is on low dose Keppra at 500 mg BID. No recommendation to change dose based on current fall history. Patient is appropriate for discharge home to [facility].     Arnoldo Hooker, PA-C 05/17/14 0225  Ward Givens, MD 05/17/14 [phone]

## 2014-05-17 NOTE — Discharge Instructions (Signed)
Cryotherapy °Cryotherapy means treatment with cold. Ice or gel packs can be used to reduce both pain and swelling. Ice is the most helpful within the first 24 to 48 hours after an injury or flare-up from overusing a muscle or joint. Sprains, strains, spasms, burning pain, shooting pain, and aches can all be eased with ice. Ice can also be used when recovering from surgery. Ice is effective, has very few side effects, and is safe for most people to use. °PRECAUTIONS  °Ice is not a safe treatment option for people with: °· Raynaud phenomenon. This is a condition affecting small blood vessels in the extremities. Exposure to cold may cause your problems to return. °· Cold hypersensitivity. There are many forms of cold hypersensitivity, including: °¨ Cold urticaria. Red, itchy hives appear on the skin when the tissues begin to warm after being iced. °¨ Cold erythema. This is a red, itchy rash caused by exposure to cold. °¨ Cold hemoglobinuria. Red blood cells break down when the tissues begin to warm after being iced. The hemoglobin that carry oxygen are passed into the urine because they cannot combine with blood proteins fast enough. °· Numbness or altered sensitivity in the area being iced. °If you have any of the following conditions, do not use ice until you have discussed cryotherapy with your caregiver: °· Heart conditions, such as arrhythmia, angina, or chronic heart disease. °· High blood pressure. °· Healing wounds or open skin in the area being iced. °· Current infections. °· Rheumatoid arthritis. °· Poor circulation. °· Diabetes. °Ice slows the blood flow in the region it is applied. This is beneficial when trying to stop inflamed tissues from spreading irritating chemicals to surrounding tissues. However, if you expose your skin to cold temperatures for too long or without the proper protection, you can damage your skin or nerves. Watch for signs of skin damage due to cold. °HOME CARE INSTRUCTIONS °Follow  these tips to use ice and cold packs safely. °· Place a dry or damp towel between the ice and skin. A damp towel will cool the skin more quickly, so you may need to shorten the time that the ice is used. °· For a more rapid response, add gentle compression to the ice. °· Ice for no more than 10 to 20 minutes at a time. The bonier the area you are icing, the less time it will take to get the benefits of ice. °· Check your skin after 5 minutes to make sure there are no signs of a poor response to cold or skin damage. °· Rest 20 minutes or more between uses. °· Once your skin is numb, you can end your treatment. You can test numbness by very lightly touching your skin. The touch should be so light that you do not see the skin dimple from the pressure of your fingertip. When using ice, most people will feel these normal sensations in this order: cold, burning, aching, and numbness. °· Do not use ice on someone who cannot communicate their responses to pain, such as small children or people with dementia. °HOW TO MAKE AN ICE PACK °Ice packs are the most common way to use ice therapy. Other methods include ice massage, ice baths, and cryosprays. Muscle creams that cause a cold, tingly feeling do not offer the same benefits that ice offers and should not be used as a substitute unless recommended by your caregiver. °To make an ice pack, do one of the following: °· Place crushed ice or a   bag of frozen vegetables in a sealable plastic bag. Squeeze out the excess air. Place this bag inside another plastic bag. Slide the bag into a pillowcase or place a damp towel between your skin and the bag. °· Mix 3 parts water with 1 part rubbing alcohol. Freeze the mixture in a sealable plastic bag. When you remove the mixture from the freezer, it will be slushy. Squeeze out the excess air. Place this bag inside another plastic bag. Slide the bag into a pillowcase or place a damp towel between your skin and the bag. °SEEK MEDICAL CARE  IF: °· You develop white spots on your skin. This may give the skin a blotchy (mottled) appearance. °· Your skin turns blue or pale. °· Your skin becomes waxy or hard. °· Your swelling gets worse. °MAKE SURE YOU:  °· Understand these instructions. °· Will watch your condition. °· Will get help right away if you are not doing well or get worse. °Document Released: 01/08/2011 Document Revised: 09/28/2013 Document Reviewed: 01/08/2011 °[employer]® Patient Information ©2015 [employer]. This information is not intended to replace advice given to you by your health care provider. Make sure you discuss any questions you have with your health care provider. ° °

## 2014-06-16 ENCOUNTER — Telehealth: Payer: Self-pay | Admitting: Neurology

## 2014-06-16 NOTE — Telephone Encounter (Signed)
Patient's sister is calling to find out what is needed for patient's visit on 06/17/14 and will they call if weather is bad, is a new patient. Explained that a updated meds list or medications will be needed and yes we will call if closed.

## 2014-06-17 ENCOUNTER — Ambulatory Visit (INDEPENDENT_AMBULATORY_CARE_PROVIDER_SITE_OTHER): Payer: Medicare HMO | Admitting: Neurology

## 2014-06-17 ENCOUNTER — Encounter: Payer: Self-pay | Admitting: Neurology

## 2014-06-17 VITALS — BP 139/68 | HR 61 | Ht 62.0 in | Wt 152.6 lb

## 2014-06-17 DIAGNOSIS — G309 Alzheimer's disease, unspecified: Secondary | ICD-10-CM

## 2014-06-17 DIAGNOSIS — R569 Unspecified convulsions: Secondary | ICD-10-CM | POA: Insufficient documentation

## 2014-06-17 DIAGNOSIS — F0280 Dementia in other diseases classified elsewhere without behavioral disturbance: Secondary | ICD-10-CM

## 2014-06-17 HISTORY — DX: Dementia in other diseases classified elsewhere, unspecified severity, without behavioral disturbance, psychotic disturbance, mood disturbance, and anxiety: F02.80

## 2014-06-17 HISTORY — DX: Unspecified convulsions: R56.9

## 2014-06-17 MED ORDER — DONEPEZIL HCL 5 MG PO TABS: 5.0000 mg | ORAL_TABLET | Freq: Every day | ORAL | Status: DC

## 2014-06-17 NOTE — Patient Instructions (Addendum)
Continue the Keppra for seizures, we will reduce the Aricept (donepezil) to a 5 mg dose at night. The Aricept may promote seizures.  Alzheimer Disease Alzheimer disease is a mental disorder. It causes memory loss and loss of other mental functions, such as learning, thinking, problem solving, communicating, and completing tasks. The mental losses interfere with the ability to perform daily activities at work, at home, or in social situations. Alzheimer disease usually starts in a person's late 40s or early 42s but can start earlier in life (familial form). The mental changes caused by this disease are permanent and worsen over time. As the illness progresses, the ability to do even the simplest things is lost. Survival with Alzheimer disease ranges from several years to as long as 20 years. CAUSES Alzheimer disease is caused by abnormally high levels of a protein (beta-amyloid) in the brain. This protein forms very small deposits within and around the brain's nerve cells. These deposits prevent the nerve cells from working properly. Experts are not certain what causes the beta-amyloid deposits in this disease. RISK FACTORS The following major risk factors have been identified:  Increasing age.  Certain genetic variations, such as Down syndrome (trisomy 21). SYMPTOMS In the early stages of Alzheimer disease, you are still able to perform daily activities but need greater effort, more time, or memory aids. Early symptoms include:  Mild memory loss of recent events, names, or phone numbers.  Loss of objects.  Minor loss of vocabulary.  Difficulty with complex tasks, such as paying bills or driving in unfamiliar locations. Other mental functions deteriorate as the disease worsens. These changes slowly go from mild to severe. Symptoms at this stage include:  Difficulty remembering. You may not be able to recall personal information such as your address and telephone number. You may become  confused about the date, the season of the year, or your location.  Difficulty maintaining attention. You may forget what you wanted to say during conversations and repeat what you have already said.  Difficulty learning new information or tasks. You may not remember what you read or the name of a new friend you met.  Difficulty counting or doing math. You may have difficulty with complex math problems. You may make mistakes in paying bills or managing your checkbook.  Poor reasoning and judgment. You may make poor decisions or not dress right for the weather.  Difficulty communicating. You may have regular difficulty remembering words, naming objects, expressing yourself clearly, or writing sentences that make sense.  Difficulty performing familiar daily activities. You may get lost driving in familiar locations or need help eating, bathing, dressing, grooming, or using the toilet. You may have difficulty maintaining bladder or bowel control.  Difficulty recognizing familiar faces. You may confuse family members or close friends with one another. You may not recognize a close relative or may mistake strangers for family. Alzheimer disease also may cause changes in personality and behavior. These changes include:   Loss of interest or motivation.  Social withdrawal.  Anxiety.  Difficulty sleeping.  Uncharacteristic anger or combativeness.  A false belief that someone is trying to harm you (paranoia).  Seeing things that are not real (hallucinations).  Agitation. Confusion and disruptive behavior are often worse at night and may be triggered by changes in the environment or acute medical issues. DIAGNOSIS  Alzheimer disease is diagnosed through an assessment by your health care provider. During this assessment, your health care provider will do the following:  Ask you and your  family, friends, or caregivers questions about your symptoms, their frequency, their duration and  progression, and the effect they are having on your life.  Ask questions about your personal and family medical history and use of alcohol or drugs, including prescription medicine.  Perform a physical exam and order blood tests and brain imaging exams. Your health care provider may refer you to a specialist for detailed evaluation of your mental functions (neuropsychological testing).  Many different brain disorders, medical conditions, and certain substances can cause symptoms that resemble Alzheimer disease symptoms. These must be ruled out before this disease can be diagnosed. If Alzheimer disease is diagnosed, it will be considered either "possible" or "probable" Alzheimer disease. "Possible" Alzheimer disease means that your symptoms are typical of the disease and no other disorder is causing them. "Probable" Alzheimer disease means that you also have a family history of the disease or genetic test results that support the diagnosis. Certain tests, mostly used in research studies, are highly specific for Alzheimer disease.  TREATMENT  There is currently no cure for this disease. The goals of treatment are to:  Slow down the progression of the disease.  Preserve mental function as long as possible.  Manage behavioral symptoms.  Make life easier for the person with Alzheimer disease and his or her caregivers. The following treatment options are available:  Medicine. Certain medicines may help slow memory loss by changing the level of certain chemicals in the brain. Medicine may also help with behavioral symptoms.  Talk therapy. Talk therapy provides education, support, and memory aids for people with this disease. It is most effective in the early stages of the illness.  Caregiving. Caregivers may be family members, friends, or trained medical professionals. They help the person with Alzheimer disease with daily life activities. Caregiving may take place at home or at a nursing  facility.  Family support groups. These provide education, emotional support, and information about community resources to family members who are taking care of the person with this disease. Document Released: 01/24/2004 Document Revised: 09/28/2013 Document Reviewed: 09/19/2012 [facility] Patient Information 2015 [place], [place]. This information is not intended to replace advice given to you by your health care provider. Make sure you discuss any questions you have with your health care provider.

## 2014-06-17 NOTE — Progress Notes (Signed)
Reason for visit: Alzheimer's disease, seizures  Alexandria Lowe is a 59 y.o. female  History of present illness:  Ms. Alexandria Lowe is a 59 year old right-handed black female with a history of Alzheimer's disease that began at least 6 years ago. The patient has been in an extended care facility for 7 or 8 months. She began having seizures in June 2015 when she lives with her son. The patient has had several recent seizures, one occurred at the nursing facility, resulting in a fall. The patient has had one or 2 other seizures that were witnessed lasting 1-2 minutes. They are associated with generalized, tonic clonic events. The patient is on Aricept taking 10 mg at night. She was seen in the hospital, a CT scan of the brain was done, the images were reviewed on line. This did not show any acute abnormalities. The patient was placed on Keppra, 500 mg twice daily. The patient currently requires some assistance with bathing and dressing. She is able to tell when she needs to use the bathroom, but she has decreased verbal output, much of what is said is nonsense. The patient is ambulatory, and she will have problems with agitation at times. She is sent to this office for evaluation of the seizures.  Past Medical History  Diagnosis Date  . Hypertension   . Dementia   . Alzheimer's disease 06/17/2014  . Convulsions/seizures 06/17/2014    History reviewed. No pertinent past surgical history.  Family History  Problem Relation Age of Onset  . Cancer Mother     lung  . Hypertension Sister   . Thyroid disease Sister   . Hypertension Brother   . Diabetes Brother     Social history:  reports that she has never smoked. She has never used smokeless tobacco. She reports that she does not drink alcohol or use illicit drugs.  Medications:  Current Outpatient Prescriptions on File Prior to Visit  Medication Sig Dispense Refill  . acetaminophen (TYLENOL) 500 MG tablet Take 500 mg by mouth every 4 (four) hours  as needed for fever.    . ALPRAZolam (XANAX) 0.25 MG tablet Take 0.25 mg by mouth every 8 (eight) hours as needed for anxiety.    . Alum & Mag Hydroxide-Simeth (GERI-LANTA PO) Take 30 mLs by mouth every 6 (six) hours as needed (heartburn/indigestion).    Marland Kitchen. atorvastatin (LIPITOR) 20 MG tablet Take 20 mg by mouth daily. At 5pm    . brimonidine (ALPHAGAN P) 0.1 % SOLN Place 1 drop into both eyes 2 (two) times daily.    . Calcium Carbonate-Vitamin D (CALCIUM-D) 600-400 MG-UNIT TABS Take 1 tablet by mouth daily.    Marland Kitchen. guaiFENesin (ROBITUSSIN) 100 MG/5ML liquid Take 200 mg by mouth every 6 (six) hours as needed for cough.    . levETIRAcetam (KEPPRA) 500 MG tablet Take 1 tablet (500 mg total) by mouth 2 (two) times daily. 30 tablet 0  . lisinopril-hydrochlorothiazide (PRINZIDE,ZESTORETIC) 20-12.5 MG per tablet Take 1 tablet by mouth daily.    Marland Kitchen. loperamide (IMODIUM) 2 MG capsule Take 2 mg by mouth as needed for diarrhea or loose stools.    Marland Kitchen. loratadine (CLARITIN) 10 MG tablet Take 10 mg by mouth daily.    . magnesium hydroxide (MILK OF MAGNESIA) 400 MG/5ML suspension Take 30 mLs by mouth at bedtime as needed for mild constipation.    . memantine (NAMENDA) 10 MG tablet Take 1 tablet (10 mg total) by mouth 2 (two) times daily. (Patient taking differently: Take  10 mg by mouth daily. ) 60 tablet 12  . mirtazapine (REMERON) 15 MG tablet Take 15 mg by mouth at bedtime.    . sertraline (ZOLOFT) 50 MG tablet Take 1 tablet (50 mg total) by mouth daily. 30 tablet 12  . Travoprost, BAK Free, (TRAVATAN) 0.004 % SOLN ophthalmic solution Place 1 drop into both eyes at bedtime.     No current facility-administered medications on file prior to visit.     No Known Allergies  ROS:  Out of a complete 14 system review of symptoms, the patient complains only of the following symptoms, and all other reviewed systems are negative.  Incontinence of bowel and bladder Feeling cold Memory loss, seizures Not enough sleep,  disinterest in activities  Blood pressure 139/68, pulse 61, height  (1.575 m), weight 152 lb 9.6 oz (69.219 kg).  Physical Exam  General: The patient is alert and cooperative at the time of the examination.  Eyes: Pupils are equal, round, and reactive to light. Discs are flat bilaterally.  Neck: The neck is supple, no carotid bruits are noted.  Respiratory: The respiratory examination is clear.  Cardiovascular: The cardiovascular examination reveals a regular rate and rhythm, no obvious murmurs or rubs are noted.  Skin: Extremities are without significant edema.  Neurologic Exam  Mental status: The patient is alert and cooperative, speech is nonsensical at times. She is unable cooperate well for Mini-Mental status examination testing. She is oriented to name, not place or date.  Cranial nerves: Facial symmetry is present. There is good sensation of the face to pinprick and soft touch bilaterally. The strength of the facial muscles and the muscles to head turning and shoulder shrug are normal bilaterally. Speech is well enunciated, but is nonsensical. Extraocular movements are full. Visual fields are full. The tongue is midline, and the patient has symmetric elevation of the soft palate. No obvious hearing deficits are noted.  Motor: The motor testing reveals 5 over 5 strength of all 4 extremities. Good symmetric motor tone is noted throughout.  Sensory: Sensory testing is intact to pinprick, soft touch and vibration sensation on all 4 extremities. No evidence of extinction is noted.  Coordination: Cerebellar testing reveals good finger-nose-finger bilaterally. 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  Gait and station: Gait is normal. Tandem gait is was not tested. Romberg is negative. No drift is seen.  Reflexes: Deep tendon reflexes are symmetric and normal bilaterally. Toes are downgoing bilaterally.   CT head 05/17/14:  IMPRESSION: 1. No evidence of  traumatic intracranial injury or fracture. 2. No evidence of fracture or subluxation along the cervical spine. 3. Soft tissue swelling overlying the right frontal calvarium, and also at the posterior vertex. 4. 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 5. Mild to moderate cortical volume loss and scattered small vessel ischemic microangiopathy. 6. 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 7. Periapical abscess incidentally noted at the root of the right maxillary canine.    Assessment/Plan:  1. Alzheimer's disease  2. Generalized seizures  The patient likely has seizures associated with advanced Alzheimer's disease of early onset. The patient is on Aricept which may lower the seizure threshold. This medication will be reduced to a 5 mg dose in the evening. If the seizures continue, the Aricept may need to be discontinued altogether. She is to continue the Keppra at 500 mg twice daily. She will follow-up in 6 months. The family is to contact me if further  seizures are noted.  Marlan Palau MD 06/18/2014 10:01 AM  [facility] [address] [place], [place] [address]  Phone [phone] Fax [phone]

## 2014-12-16 ENCOUNTER — Ambulatory Visit: Payer: Medicare HMO | Admitting: Adult Health

## 2015-01-04 ENCOUNTER — Ambulatory Visit (INDEPENDENT_AMBULATORY_CARE_PROVIDER_SITE_OTHER): Payer: Medicare HMO | Admitting: Adult Health

## 2015-01-04 ENCOUNTER — Encounter: Payer: Self-pay | Admitting: Adult Health

## 2015-01-04 VITALS — BP 176/82 | HR 69 | Ht 64.0 in | Wt 154.0 lb

## 2015-01-04 DIAGNOSIS — G309 Alzheimer's disease, unspecified: Secondary | ICD-10-CM

## 2015-01-04 DIAGNOSIS — F0280 Dementia in other diseases classified elsewhere without behavioral disturbance: Secondary | ICD-10-CM

## 2015-01-04 DIAGNOSIS — R569 Unspecified convulsions: Secondary | ICD-10-CM

## 2015-01-04 NOTE — Patient Instructions (Addendum)
Unable to do a memory test today. Not sure if Aricept is beneficial at this point. Aricept can decrease seizure threshold. It is my recommendation to consider stopping Aricept or at least decreasing to 5 mg daily.  Continue Keppra for seizures. If you have any seizure events please let us know.

## 2015-01-04 NOTE — Progress Notes (Signed)
PATIENT: Alexandria Lowe DOB: April 04, 1956  REASON FOR VISIT: follow up-Alzheimer's disease, seizures HISTORY FROM: patient  HISTORY OF PRESENT ILLNESS: Alexandria Lowe is a 59 year old female with a history of Alzheimer's disease and seizures. She returns today for follow up. The patient is currently taking Aricept 10 mg daily. She is on Keppra 500 mg twice a day for seizures. The patient's caregiver states that she's not had any seizure events. She lives in an extended-care facility. She requires assistance with her ADLs. Since the last visit the caregiver feels that her memory has remained the same. The patient is unable to engage in conversation. The caregiver states that recently she has been going the front of the facility and will pull her pants down and urinate in the floor. Denies any agitation or aggressiveness. She returns today for an evaluation.  HISTORY 06/17/14 (WILLIS): Alexandria. Lowe is a 59 year old right-handed black female with a history of Alzheimer's disease that began at least 6 years ago. The patient has been in an extended care facility for 7 or 8 months. She began having seizures in June 2015 when she lives with her son. The patient has had several recent seizures, one occurred at the nursing facility, resulting in a fall. The patient has had one or 2 other seizures that were witnessed lasting 1-2 minutes. They are associated with generalized, tonic clonic events. The patient is on Aricept taking 10 mg at night. She was seen in the hospital, a CT scan of the brain was done, the images were reviewed on line. This did not show any acute abnormalities. The patient was placed on Keppra, 500 mg twice daily. The patient currently requires some assistance with bathing and dressing. She is able to tell when she needs to use the bathroom, but she has decreased verbal output, much of what is said is nonsense. The patient is ambulatory, and she will have problems with agitation at times. She is sent to  this office for evaluation of the seizures.  REVIEW OF SYSTEMS: Out of a complete 14 system review of symptoms, the patient complains only of the following symptoms, and all other reviewed systems are negative.  ALLERGIES: No Known Allergies  HOME MEDICATIONS: Outpatient Prescriptions Prior to Visit  Medication Sig Dispense Refill  . acetaminophen (TYLENOL) 500 MG tablet Take 500 mg by mouth every 4 (four) hours as needed for fever.    . ALPRAZolam (XANAX) 0.25 MG tablet Take 0.25 mg by mouth every 8 (eight) hours as needed for anxiety.    . Alum & Mag Hydroxide-Simeth (GERI-LANTA PO) Take 30 mLs by mouth every 6 (six) hours as needed (heartburn/indigestion).    Marland Kitchen atorvastatin (LIPITOR) 20 MG tablet Take 20 mg by mouth daily. At 5pm    . brimonidine (ALPHAGAN P) 0.1 % SOLN Place 1 drop into both eyes 2 (two) times daily.    . Calcium Carbonate-Vitamin D (CALCIUM-D) 600-400 MG-UNIT TABS Take 1 tablet by mouth daily.    Marland Kitchen donepezil (ARICEPT) 5 MG tablet Take 1 tablet (5 mg total) by mouth at bedtime. 30 tablet 0  . guaiFENesin (ROBITUSSIN) 100 MG/5ML liquid Take 200 mg by mouth every 6 (six) hours as needed for cough.    . levETIRAcetam (KEPPRA) 500 MG tablet Take 1 tablet (500 mg total) by mouth 2 (two) times daily. 30 tablet 0  . lisinopril-hydrochlorothiazide (PRINZIDE,ZESTORETIC) 20-12.5 MG per tablet Take 1 tablet by mouth daily.    Marland Kitchen loperamide (IMODIUM) 2 MG capsule Take 2 mg  by mouth as needed for diarrhea or loose stools.    Marland Kitchen loratadine (CLARITIN) 10 MG tablet Take 10 mg by mouth daily.    . magnesium hydroxide (MILK OF MAGNESIA) 400 MG/5ML suspension Take 30 mLs by mouth at bedtime as needed for mild constipation.    . memantine (NAMENDA) 10 MG tablet Take 1 tablet (10 mg total) by mouth 2 (two) times daily. (Patient taking differently: Take 10 mg by mouth daily. ) 60 tablet 12  . mirtazapine (REMERON) 15 MG tablet Take 15 mg by mouth at bedtime.    . sertraline (ZOLOFT) 50 MG  tablet Take 1 tablet (50 mg total) by mouth daily. 30 tablet 12  . Travoprost, BAK Free, (TRAVATAN) 0.004 % SOLN ophthalmic solution Place 1 drop into both eyes at bedtime.     No facility-administered medications prior to visit.    PAST MEDICAL HISTORY: Past Medical History  Diagnosis Date  . Hypertension   . Dementia   . Alzheimer's disease 06/17/2014  . Convulsions/seizures 06/17/2014    PAST SURGICAL HISTORY: No past surgical history on file.  FAMILY HISTORY: Family History  Problem Relation Age of Onset  . Cancer Mother     lung  . Hypertension Sister   . Thyroid disease Sister   . Hypertension Brother   . Diabetes Brother     SOCIAL HISTORY: History   Social History  . Marital Status: Widowed    Spouse Name: N/A  . Number of Children: 1  . Years of Education: N/A   Occupational History  . Disabled    Social History Main Topics  . Smoking status: Never Smoker   . Smokeless tobacco: Never Used  . Alcohol Use: No  . Drug Use: No  . Sexual Activity: Not on file   Other Topics Concern  . Not on file   Social History Narrative   Patient is right handed   Patient lives at [facility].      PHYSICAL EXAM  Filed Vitals:   01/04/15 1316  BP: 176/82  Pulse: 69  Height: 5\' 4"  (1.626 m)  Weight: 154 lb (69.854 kg)   Body mass index is 26.42 kg/(m^2).  Generalized: Well developed, in no acute distress   Neurological examination  Mentation: Alert. Patient follows commands intermittently. Unable to engage in a meaningful conversation. She simply answers yes or no to all questions.  Cranial nerve II-XII:  Extraocular movements were full, visual field were full on confrontational test. Facial sensation and strength were normal. Uvula tongue midline. Head turning and shoulder shrug  were normal and symmetric. Motor: The motor testing reveals 5 over 5 strength of all 4 extremities. Good symmetric motor tone is noted throughout.  Sensory: Unable to test    Coordination: Unable to test-patient cannot comprehend the directions.  Gait and station: Gait is normal.   Reflexes: Deep tendon reflexes are symmetric and normal bilaterally.   DIAGNOSTIC DATA (LABS, IMAGING, TESTING) - I reviewed patient records, labs, notes, testing and imaging myself where available.  Lab Results  Component Value Date   WBC 5.9 05/15/2014   HGB 12.3 05/15/2014   HCT 38.7 05/15/2014   MCV 79.8 05/15/2014   PLT 218 05/15/2014      Component Value Date/Time   NA 141 05/15/2014 0935   K 3.9 05/15/2014 0935   CL 103 05/15/2014 0935   CO2 23 05/15/2014 0935   GLUCOSE 103* 05/15/2014 0935   BUN 16 05/15/2014 0935   CREATININE 1.07 05/15/2014 0935  CALCIUM 10.0 05/15/2014 0935   PROT 7.2 04/17/2014 0943   ALBUMIN 3.5 04/17/2014 0943   AST 29 04/17/2014 0943   ALT 26 04/17/2014 0943   ALKPHOS 140* 04/17/2014 0943   BILITOT 0.4 04/17/2014 0943   GFRNONAA 56* 05/15/2014 0935   GFRAA 65* 05/15/2014 0935      ASSESSMENT AND PLAN 59 y.o. year old female  has a past medical history of Hypertension; Dementia; Alzheimer's disease (06/17/2014); and Convulsions/seizures (06/17/2014). here with:  1. Alzheimer's disease 2. Seizures  We were unable to obtain a MMSE score today. Patient's verbal communication is very limited. She usually answers questions with yes or no or does not answer at all. She is not had any seizure events since January. At this point I'm not sure that Aricept is beneficial. Aricept is currently being managed by her primary care provider. It is my recommendation that Aricept be discontinued or decreased to 5 mg due to it decreasing the seizure threshold. She should continue on Keppra for seizures. I advised the caregiver that if she has any additional seizures or her symptoms worsen she should let us know. She will follow-up in 6 months or sooner if needed.    Butch Penny, MSN, NP-C 01/04/2015, 1:10 PM [facility] [address] [place], [place] [address] [phone]  Note: This document was prepared with digital dictation and possible smart phrase technology. Any transcriptional errors that result from this process are unintentional.

## 2015-01-04 NOTE — Progress Notes (Signed)
I have read the note, and I agree with the clinical assessment and plan.  Alexandria Lowe   

## 2015-06-01 ENCOUNTER — Emergency Department (HOSPITAL_COMMUNITY): Payer: Medicare HMO

## 2015-06-01 ENCOUNTER — Observation Stay (HOSPITAL_COMMUNITY)
Admission: EM | Admit: 2015-06-01 | Discharge: 2015-06-02 | Disposition: A | Discharge status: Skilled Nursing Facility | Payer: Medicare HMO | Attending: Internal Medicine | Admitting: Internal Medicine

## 2015-06-01 ENCOUNTER — Encounter (HOSPITAL_COMMUNITY): Payer: Self-pay | Admitting: Emergency Medicine

## 2015-06-01 DIAGNOSIS — R569 Unspecified convulsions: Principal | ICD-10-CM

## 2015-06-01 DIAGNOSIS — R262 Difficulty in walking, not elsewhere classified: Secondary | ICD-10-CM | POA: Diagnosis not present

## 2015-06-01 DIAGNOSIS — R4182 Altered mental status, unspecified: Secondary | ICD-10-CM

## 2015-06-01 DIAGNOSIS — F0280 Dementia in other diseases classified elsewhere without behavioral disturbance: Secondary | ICD-10-CM | POA: Insufficient documentation

## 2015-06-01 DIAGNOSIS — W19XXXA Unspecified fall, initial encounter: Secondary | ICD-10-CM | POA: Diagnosis not present

## 2015-06-01 DIAGNOSIS — T148 Other injury of unspecified body region: Secondary | ICD-10-CM

## 2015-06-01 DIAGNOSIS — T148XXA Other injury of unspecified body region, initial encounter: Secondary | ICD-10-CM | POA: Insufficient documentation

## 2015-06-01 DIAGNOSIS — R4189 Other symptoms and signs involving cognitive functions and awareness: Secondary | ICD-10-CM | POA: Diagnosis present

## 2015-06-01 DIAGNOSIS — G40909 Epilepsy, unspecified, not intractable, without status epilepticus: Secondary | ICD-10-CM | POA: Insufficient documentation

## 2015-06-01 DIAGNOSIS — S0083XA Contusion of other part of head, initial encounter: Secondary | ICD-10-CM | POA: Diagnosis not present

## 2015-06-01 DIAGNOSIS — R400 Somnolence: Secondary | ICD-10-CM | POA: Diagnosis not present

## 2015-06-01 DIAGNOSIS — G309 Alzheimer's disease, unspecified: Secondary | ICD-10-CM | POA: Diagnosis not present

## 2015-06-01 DIAGNOSIS — I10 Essential (primary) hypertension: Secondary | ICD-10-CM | POA: Insufficient documentation

## 2015-06-01 DIAGNOSIS — S0003XA Contusion of scalp, initial encounter: Secondary | ICD-10-CM | POA: Diagnosis not present

## 2015-06-01 DIAGNOSIS — R404 Transient alteration of awareness: Secondary | ICD-10-CM

## 2015-06-01 HISTORY — DX: Major depressive disorder, single episode, unspecified: F32.9

## 2015-06-01 HISTORY — DX: Depression, unspecified: F32.A

## 2015-06-01 HISTORY — DX: Aphasia: R47.01

## 2015-06-01 LAB — COMPREHENSIVE METABOLIC PANEL
ALT: 26 U/L (ref 14–54)
AST: 32 U/L (ref 15–41)
Albumin: 3.3 g/dL — ABNORMAL LOW (ref 3.5–5.0)
Alkaline Phosphatase: 65 U/L (ref 38–126)
Anion gap: 9 (ref 5–15)
BUN: 17 mg/dL (ref 6–20)
CALCIUM: 9.1 mg/dL (ref 8.9–10.3)
CO2: 22 mmol/L (ref 22–32)
CREATININE: 1.02 mg/dL — AB (ref 0.44–1.00)
Chloride: 110 mmol/L (ref 101–111)
GFR calc non Af Amer: 59 mL/min — ABNORMAL LOW (ref >60)
Glucose, Bld: 98 mg/dL (ref 65–99)
Potassium: 3.6 mmol/L (ref 3.5–5.1)
SODIUM: 141 mmol/L (ref 135–145)
Total Bilirubin: 0.5 mg/dL (ref 0.3–1.2)
Total Protein: 6.2 g/dL — ABNORMAL LOW (ref 6.5–8.1)

## 2015-06-01 LAB — CBC WITH DIFFERENTIAL/PLATELET
Basophils Absolute: 0 10*3/uL (ref 0.0–0.1)
Basophils Relative: 0 %
Eosinophils Absolute: 0.1 10*3/uL (ref 0.0–0.7)
Eosinophils Relative: 2 %
HCT: 35.7 % — ABNORMAL LOW (ref 36.0–46.0)
Hemoglobin: 11.3 g/dL — ABNORMAL LOW (ref 12.0–15.0)
LYMPHS ABS: 1.8 10*3/uL (ref 0.7–4.0)
LYMPHS PCT: 30 %
MCH: 25.7 pg — AB (ref 26.0–34.0)
MCHC: 31.7 g/dL (ref 30.0–36.0)
MCV: 81.1 fL (ref 78.0–100.0)
Monocytes Absolute: 0.3 10*3/uL (ref 0.1–1.0)
Monocytes Relative: 6 %
NEUTROS PCT: 62 %
Neutro Abs: 3.8 10*3/uL (ref 1.7–7.7)
Platelets: 216 10*3/uL (ref 150–400)
RBC: 4.4 MIL/uL (ref 3.87–5.11)
RDW: 14.3 % (ref 11.5–15.5)
WBC: 6.1 10*3/uL (ref 4.0–10.5)

## 2015-06-01 LAB — URINALYSIS, ROUTINE W REFLEX MICROSCOPIC
Bilirubin Urine: NEGATIVE
GLUCOSE, UA: NEGATIVE mg/dL
HGB URINE DIPSTICK: NEGATIVE
KETONES UR: NEGATIVE mg/dL
Leukocytes, UA: NEGATIVE
NITRITE: NEGATIVE
PH: 7 (ref 5.0–8.0)
Protein, ur: NEGATIVE mg/dL
Specific Gravity, Urine: 1.016 (ref 1.005–1.030)

## 2015-06-01 LAB — CREATININE, SERUM
CREATININE: 0.89 mg/dL (ref 0.44–1.00)
GFR calc Af Amer: >60 mL/min (ref >60)
GFR calc non Af Amer: >60 mL/min (ref >60)

## 2015-06-01 LAB — CBC
HCT: 37 % (ref 36.0–46.0)
Hemoglobin: 12 g/dL (ref 12.0–15.0)
MCH: 26.1 pg (ref 26.0–34.0)
MCHC: 32.4 g/dL (ref 30.0–36.0)
MCV: 80.6 fL (ref 78.0–100.0)
PLATELETS: 193 10*3/uL (ref 150–400)
RBC: 4.59 MIL/uL (ref 3.87–5.11)
RDW: 14.3 % (ref 11.5–15.5)
WBC: 10.4 10*3/uL (ref 4.0–10.5)

## 2015-06-01 LAB — PHOSPHORUS: Phosphorus: 2.2 mg/dL — ABNORMAL LOW (ref 2.5–4.6)

## 2015-06-01 LAB — MAGNESIUM: MAGNESIUM: 2 mg/dL (ref 1.7–2.4)

## 2015-06-01 MED ORDER — LISINOPRIL 20 MG PO TABS: 20.0000 mg | ORAL_TABLET | Freq: Every day | ORAL | Status: DC

## 2015-06-01 MED ORDER — LATANOPROST 0.005 % OP SOLN
1.0000 [drp] | Freq: Every day | OPHTHALMIC | Status: DC
  Administered 2015-06-01: 1 [drp] via OPHTHALMIC
  Filled 2015-06-01: qty 2.5

## 2015-06-01 MED ORDER — BRINZOLAMIDE 1 % OP SUSP
1.0000 [drp] | Freq: Two times a day (BID) | OPHTHALMIC | Status: DC
  Filled 2015-06-01: qty 10

## 2015-06-01 MED ORDER — GADOBENATE DIMEGLUMINE 529 MG/ML IV SOLN
15.0000 mL | Freq: Once | INTRAVENOUS | Status: AC | PRN
  Administered 2015-06-01: 15 mL via INTRAVENOUS

## 2015-06-01 MED ORDER — LORAZEPAM 2 MG/ML IJ SOLN
INTRAMUSCULAR | Status: AC
  Filled 2015-06-01: qty 1

## 2015-06-01 MED ORDER — SIMVASTATIN 10 MG PO TABS
10.0000 mg | ORAL_TABLET | Freq: Every day | ORAL | Status: DC
  Filled 2015-06-01 (×2): qty 1

## 2015-06-01 MED ORDER — SODIUM CHLORIDE 0.9 % IV SOLN
1000.0000 mg | Freq: Once | INTRAVENOUS | Status: AC
  Administered 2015-06-01: 1000 mg via INTRAVENOUS
  Filled 2015-06-01: qty 10

## 2015-06-01 MED ORDER — LORATADINE 10 MG PO TABS: 10.0000 mg | ORAL_TABLET | Freq: Every day | ORAL | Status: DC

## 2015-06-01 MED ORDER — ACETAMINOPHEN 500 MG PO TABS: 500.0000 mg | ORAL_TABLET | ORAL | Status: DC | PRN

## 2015-06-01 MED ORDER — SERTRALINE HCL 50 MG PO TABS: 50.0000 mg | ORAL_TABLET | Freq: Every day | ORAL | Status: DC

## 2015-06-01 MED ORDER — LORAZEPAM 0.5 MG PO TABS: 0.5000 mg | ORAL_TABLET | Freq: Three times a day (TID) | ORAL | Status: DC | PRN

## 2015-06-01 MED ORDER — ALUM & MAG HYDROXIDE-SIMETH 200-200-20 MG/5ML PO SUSP: 15.0000 mL | Freq: Four times a day (QID) | ORAL | Status: DC | PRN

## 2015-06-01 MED ORDER — BRIMONIDINE TARTRATE 0.15 % OP SOLN
1.0000 [drp] | Freq: Two times a day (BID) | OPHTHALMIC | Status: DC
  Filled 2015-06-01: qty 5

## 2015-06-01 MED ORDER — MIRTAZAPINE 15 MG PO TABS: 15.0000 mg | ORAL_TABLET | Freq: Every day | ORAL | Status: DC

## 2015-06-01 MED ORDER — LISINOPRIL-HYDROCHLOROTHIAZIDE 20-12.5 MG PO TABS: 1.0000 | ORAL_TABLET | Freq: Every day | ORAL | Status: DC

## 2015-06-01 MED ORDER — SODIUM CHLORIDE 0.9 % IV SOLN
INTRAVENOUS | Status: DC
  Administered 2015-06-01: 19:00:00 via INTRAVENOUS

## 2015-06-01 MED ORDER — ENOXAPARIN SODIUM 40 MG/0.4ML ~~LOC~~ SOLN
40.0000 mg | SUBCUTANEOUS | Status: DC
  Administered 2015-06-01: 40 mg via SUBCUTANEOUS
  Filled 2015-06-01: qty 0.4

## 2015-06-01 MED ORDER — LORAZEPAM 2 MG/ML IJ SOLN
2.0000 mg | Freq: Once | INTRAMUSCULAR | Status: AC
  Administered 2015-06-01: 2 mg via INTRAVENOUS

## 2015-06-01 MED ORDER — HYDROCHLOROTHIAZIDE 12.5 MG PO CAPS: 12.5000 mg | ORAL_CAPSULE | Freq: Every day | ORAL | Status: DC

## 2015-06-01 MED ORDER — MEMANTINE HCL 10 MG PO TABS
10.0000 mg | ORAL_TABLET | Freq: Two times a day (BID) | ORAL | Status: DC
  Filled 2015-06-01 (×3): qty 1

## 2015-06-01 MED ORDER — MORPHINE SULFATE (PF) 4 MG/ML IV SOLN
4.0000 mg | Freq: Once | INTRAVENOUS | Status: AC
  Administered 2015-06-01: 4 mg via INTRAVENOUS
  Filled 2015-06-01: qty 1

## 2015-06-01 MED ORDER — SODIUM CHLORIDE 0.9 % IV SOLN: INTRAVENOUS | Status: DC

## 2015-06-01 MED ORDER — SODIUM CHLORIDE 0.9 % IV SOLN
750.0000 mg | Freq: Two times a day (BID) | INTRAVENOUS | Status: DC
  Administered 2015-06-01 – 2015-06-02 (×3): 750 mg via INTRAVENOUS
  Filled 2015-06-01 (×5): qty 7.5

## 2015-06-01 MED ORDER — FLUTICASONE PROPIONATE 50 MCG/ACT NA SUSP
1.0000 | Freq: Every day | NASAL | Status: DC
  Filled 2015-06-01: qty 16

## 2015-06-01 MED ORDER — LEVETIRACETAM 750 MG PO TABS
750.0000 mg | ORAL_TABLET | Freq: Two times a day (BID) | ORAL | Status: DC
Start: 2015-06-01 — End: 2015-06-02

## 2015-06-01 MED ORDER — SODIUM CHLORIDE 0.9 % IV SOLN
INTRAVENOUS | Status: DC
Start: 2015-06-01 — End: 2015-06-01
  Administered 2015-06-01: 07:00:00 via INTRAVENOUS

## 2015-06-01 NOTE — ED Notes (Signed)
Pt removed C-collar 

## 2015-06-01 NOTE — ED Notes (Signed)
Called Lab to add on Mag and phosphorus.

## 2015-06-01 NOTE — ED Notes (Signed)
Pt attempting to get out of bed mulitiple times, Shanda BumpsJessica, Consulting civil engineerCharge RN aware of need for Recruitment consultantsafety sitter

## 2015-06-01 NOTE — Discharge Instructions (Signed)
Please follow with your primary care doctor in the next 2 days for a check-up. They must obtain records for further management.  ° °Do not hesitate to return to the Emergency Department for any new, worsening or concerning symptoms.  ° ° °Seizure, Adult °A seizure is abnormal electrical activity in the brain. Seizures usually last from 30 seconds to 2 minutes. There are various types of seizures. °Before a seizure, you may have a warning sensation (aura) that a seizure is about to occur. An aura may include the following symptoms:  °· Fear or anxiety. °· Nausea. °· Feeling like the room is spinning (vertigo). °· Vision changes, such as seeing flashing lights or spots. °Common symptoms during a seizure include: °· A change in attention or behavior (altered mental status). °· Convulsions with rhythmic jerking movements. °· Drooling. °· Rapid eye movements. °· Grunting. °· Loss of bladder and bowel control. °· Bitter taste in the mouth. °· Tongue biting. °After a seizure, you may feel confused and sleepy. You may also have an injury resulting from convulsions during the seizure. °HOME CARE INSTRUCTIONS  °· If you are given medicines, take them exactly as prescribed by your health care provider. °· Keep all follow-up appointments as directed by your health care provider. °· Do not swim or drive or engage in risky activity during which a seizure could cause further injury to you or others until your health care provider says it is OK. °· Get adequate rest. °· Teach friends and family what to do if you have a seizure. They should: °¨ Lay you on the ground to prevent a fall. °¨ Put a cushion under your head. °¨ Loosen any tight clothing around your neck. °¨ Turn you on your side. If vomiting occurs, this helps keep your airway clear. °¨ Stay with you until you recover. °¨ Know whether or not you need emergency care. °SEEK IMMEDIATE MEDICAL CARE IF: °· The seizure lasts longer than 5 minutes. °· The seizure is severe or you  do not wake up immediately after the seizure. °· You have an altered mental status after the seizure. °· You are having more frequent or worsening seizures. °Someone should drive you to the emergency department or call local emergency services (911 in [place] °MAKE SURE YOU: °· Understand these instructions. °· Will watch your condition. °· Will get help right away if you are not doing well or get worse. °  °This information is not intended to replace advice given to you by your health care provider. Make sure you discuss any questions you have with your health care provider. °  °Document Released: 05/11/2000 Document Revised: 06/04/2014 Document Reviewed: 12/24/2012 °Elsevier Interactive Patient Education ©2016 [employer] ° °

## 2015-06-01 NOTE — ED Notes (Signed)
Safety sitter at bedside 

## 2015-06-01 NOTE — Procedures (Signed)
ELECTROENCEPHALOGRAM REPORT   Patient: Arnoldo MoraleBonnie L Courser       Age: 60 y.o.        Sex: female Referring Physician: Dr Lavon PaganiniNandigam Report Date:  06/01/2015        Interpreting Physician: Omelia BlackwaterSUMNER, Joeline Freer JUSTIN  History: Arnoldo MoraleBonnie L Benoist is an 60 y.o. female woman with hx of seizure disorder and dementia with breakthrough seizure activity  Medications:  I have reviewed the patient's current medications. Just received ativan 2mg  IV x 1 and Keppra 1000mg  IV x 1  Conditions of Recording:  This is a 16 channel EEG carried out with the patient in the drowsy state.  Description:  The background activity consists predominantly of a low voltage, symmetrical, fairly well organized, beta activity with occasional alpha activity is seen from the parieto-occipital and posterior temporal regions. No focal slowing or epileptiform activity is noted.   Hyperventilation was not performed. Intermittent photic stimulation was not performed.  IMPRESSION: Abnormal EEG due to the presence of generalized beta activity which is likely a medication effect. No focal slowing or epileptiform activity noted.    Elspeth Choeter Vincenta Steffey, DO [facility] [phone]  If 7pm- 7am, please page neurology on call as listed in [facility]. 06/01/2015, 12:19 PM

## 2015-06-01 NOTE — Consult Note (Signed)
NEURO HOSPITALIST CONSULT NOTE   Requestig physician: Dr. Cyndie ChimeNguyen   Reason for Consult: seizure  HPI:                                                                                                                                          Alexandria Lowe is an 60 y.o. female with known seizure disorder, on Keppra 500 mg BID and Alzheimer who resides in a SNF. Patietn was brought to ED after witnessed seizure and second seizure while in ED.  Currently she has had 2 mg Ativan and morphine.  She is drowsy and post ictal She only follows minimal commands such as raising her hands. She has had a CT head showing no ICH but large left periorbital hematoma.   Past Medical History  Diagnosis Date  . Hypertension   . Dementia   . Alzheimer's disease 06/17/2014  . Convulsions/seizures (HCC) 06/17/2014    History reviewed. No pertinent past surgical history.  Family History  Problem Relation Age of Onset  . Cancer Mother     lung  . Hypertension Sister   . Thyroid disease Sister   . Hypertension Brother   . Diabetes Brother     Social History:  reports that she has never smoked. She has never used smokeless tobacco. She reports that she does not drink alcohol or use illicit drugs.  No Known Allergies  MEDICATIONS:                                                                                                                     Current Facility-Administered Medications  Medication Dose Route Frequency Provider Last Rate Last Dose  . 0.9 %  sodium chloride infusion   Intravenous Continuous Nicole Pisciotta, PA-C 125 mL/hr at 06/01/15 0727    . levETIRAcetam (KEPPRA) 750 mg in sodium chloride 0.9 % 100 mL IVPB  750 mg Intravenous Q12H Ulice Dashavid R Claron Rosencrans, PA-C       Current Outpatient Prescriptions  Medication Sig Dispense Refill  . acetaminophen (TYLENOL) 500 MG tablet Take 500 mg by mouth every 4 (four) hours as needed for fever.    . Alum & Mag Hydroxide-Simeth (GERI-LANTA  PO) Take 30 mLs by mouth every 6 (six) hours as needed (heartburn/indigestion).    .Marland Kitchen  brimonidine (ALPHAGAN P) 0.1 % SOLN Place 1 drop into both eyes 2 (two) times daily.    . brinzolamide (AZOPT) 1 % ophthalmic suspension Place 1 drop into both eyes 2 (two) times daily.    . Calcium Carbonate-Vitamin D (CALCIUM-D) 600-400 MG-UNIT TABS Take 1 tablet by mouth daily.    . ciprofloxacin (CIPRO) 250 MG tablet Take 250 mg by mouth 2 (two) times daily.    Marland Kitchen guaiFENesin (ROBITUSSIN) 100 MG/5ML liquid Take 200 mg by mouth every 6 (six) hours as needed for cough.    . hydrocerin (EUCERIN) CREA Apply 1 application topically daily.    Marland Kitchen levETIRAcetam (KEPPRA) 500 MG tablet Take 1 tablet (500 mg total) by mouth 2 (two) times daily. 30 tablet 0  . lisinopril-hydrochlorothiazide (PRINZIDE,ZESTORETIC) 20-12.5 MG per tablet Take 1 tablet by mouth daily.    Marland Kitchen loperamide (IMODIUM) 2 MG capsule Take 2 mg by mouth as needed for diarrhea or loose stools.    Marland Kitchen loratadine (CLARITIN) 10 MG tablet Take 10 mg by mouth daily.    Marland Kitchen LORazepam (ATIVAN) 0.5 MG tablet Take 0.5 mg by mouth every 8 (eight) hours as needed for anxiety.    . magnesium hydroxide (MILK OF MAGNESIA) 400 MG/5ML suspension Take 30 mLs by mouth at bedtime as needed for mild constipation.    . memantine (NAMENDA) 10 MG tablet Take 1 tablet (10 mg total) by mouth 2 (two) times daily. 60 tablet 12  . mirtazapine (REMERON) 15 MG tablet Take 15 mg by mouth at bedtime.    . mometasone (NASONEX) 50 MCG/ACT nasal spray Place 2 sprays into the nose daily.    . Multiple Vitamins-Minerals (PRESERVISION AREDS PO) Take 1 tablet by mouth 2 (two) times daily.    Marland Kitchen neomycin-bacitracin-polymyxin (NEOSPORIN) [phone] ointment Apply 1 application topically as needed (until healed).    . sertraline (ZOLOFT) 50 MG tablet Take 1 tablet (50 mg total) by mouth daily. 30 tablet 12  . simvastatin (ZOCOR) 10 MG tablet Take 10 mg by mouth at bedtime.    . Travoprost, BAK Free,  (TRAVATAN) 0.004 % SOLN ophthalmic solution Place 1 drop into both eyes at bedtime.        ROS:                                                                                                                                       History obtained from unobtainable from patient due to she is post ictal     Blood pressure 148/76, pulse 74, temperature 97.3 F (36.3 C), resp. rate 15, SpO2 100 %.   Neurologic Examination:  HEENT-  Normocephalic, no lesions, without obvious abnormality.  Normal external eye and conjunctiva.  Normal TM's bilaterally.  Normal auditory canals and external ears. Normal external nose, mucus membranes and septum.  Normal pharynx. Cardiovascular- S1, S2 normal, pulses palpable throughout   Lungs- chest clear, no wheezing, rales, normal symmetric air entry Abdomen- normal findings: bowel sounds normal Extremities- no edema Lymph-no adenopathy palpable Musculoskeletal-no joint tenderness, deformity or swelling Skin-warm and dry, no hyperpigmentation, vitiligo, or suspicious lesions  Neurological Examination Mental Status: Drowsy and post ictal.  Non-verbal.   Cranial Nerves: II: Discs flat bilaterally; blinks to threat on the right but unable to hold left eyelid open due to periorbital hematoma, pupils equal, round, reactive to light and accommodation III,IV, VI: ptosis not present, doll's intact V,VII: face symmetric, facial light touch sensation normal bilaterally VIII: hearing normal bilaterally IX,X: holds mouth clenched shut XI: bilateral shoulder shrug [id number] mouth clenched shut Motor: Moving all extremities antigravity with normal tone.  Sensory: Pinprick and light touch intact throughout, bilaterally Deep Tendon Reflexes: 2+ and symmetric throughout Plantars: Right: downgoing   Left: downgoing Cerebellar: Unable to assess Gait: not tested       Lab Results: Basic Metabolic Panel:  Recent Labs Lab 06/01/15 0736  NA 141  K 3.6  CL 110  CO2 22  GLUCOSE 98  BUN 17  CREATININE 1.02*  CALCIUM 9.1    Liver Function Tests:  Recent Labs Lab 06/01/15 0736  AST 32  ALT 26  ALKPHOS 65  BILITOT 0.5  PROT 6.2*  ALBUMIN 3.3*   No results for input(s): LIPASE, AMYLASE in the last 168 hours. No results for input(s): AMMONIA in the last 168 hours.  CBC:  Recent Labs Lab 06/01/15 0736  WBC 6.1  NEUTROABS 3.8  HGB 11.3*  HCT 35.7*  MCV 81.1  PLT 216    Cardiac Enzymes: No results for input(s): CKTOTAL, CKMB, CKMBINDEX, TROPONINI in the last 168 hours.  Lipid Panel: No results for input(s): CHOL, TRIG, HDL, CHOLHDL, VLDL, LDLCALC in the last 168 hours.  CBG: No results for input(s): GLUCAP in the last 168 hours.  Microbiology: Results for orders placed or performed during the hospital encounter of 12/05/13  Urine culture     Status: None   Collection Time: 12/05/13 10:53 AM  Result Value Ref Range Status   Specimen Description URINE, CATHETERIZED  Final   Special Requests NONE  Final   Culture  Setup Time   Final    12/05/2013 22:15 Performed at [facility]   Colony Count NO GROWTH Performed at [facility]  Final   Culture NO GROWTH Performed at [facility]  Final   Report Status 12/06/2013 FINAL  Final    Coagulation Studies: No results for input(s): LABPROT, INR in the last 72 hours.  Imaging: Ct Head Wo Contrast  06/01/2015  CLINICAL DATA:  Fall with head trauma and seizure. EXAM: CT HEAD WITHOUT CONTRAST CT CERVICAL SPINE WITHOUT CONTRAST TECHNIQUE: Multidetector CT imaging of the head and cervical spine was performed following the standard protocol without intravenous contrast. Multiplanar CT image reconstructions of the cervical spine were also generated. COMPARISON:  05/17/2014 FINDINGS: CT HEAD FINDINGS Despite 2 acquisitions there still moderate motion  limitation which could obscure pathology. This is the best obtainable study currently. Skull and Sinuses:There is a large hematoma in the left frontal scalp without underlying fracture or opaque foreign body. No sinus or mastoid fluid level. Partly visualized periapical erosion around the right  upper canine. Visualized orbits: No evidence of injury Brain: No evidence of acute infarction, hemorrhage, hydrocephalus, or mass lesion/mass effect. No seizure focus detected. Generalized atrophy with prominent mesial temporal involvement correlating with history of Alzheimer's disease CT CERVICAL SPINE FINDINGS Negative for acute fracture or subluxation. No prevertebral edema. No gross cervical canal hematoma. Degenerative changes without significant osseous canal or foraminal stenosis. Incidental small right cervical rib IMPRESSION: 1. Motion degraded head CT without evidence of intracranial injury. 2. Large left frontal scalp hematoma without fracture. 3. No evidence of cervical spine injury. Electronically Signed   By: Marnee Spring M.D.   On: 06/01/2015 08:09   Ct Cervical Spine Wo Contrast  06/01/2015  CLINICAL DATA:  Fall with head trauma and seizure. EXAM: CT HEAD WITHOUT CONTRAST CT CERVICAL SPINE WITHOUT CONTRAST TECHNIQUE: Multidetector CT imaging of the head and cervical spine was performed following the standard protocol without intravenous contrast. Multiplanar CT image reconstructions of the cervical spine were also generated. COMPARISON:  05/17/2014 FINDINGS: CT HEAD FINDINGS Despite 2 acquisitions there still moderate motion limitation which could obscure pathology. This is the best obtainable study currently. Skull and Sinuses:There is a large hematoma in the left frontal scalp without underlying fracture or opaque foreign body. No sinus or mastoid fluid level. Partly visualized periapical erosion around the right upper canine. Visualized orbits: No evidence of injury Brain: No evidence of acute  infarction, hemorrhage, hydrocephalus, or mass lesion/mass effect. No seizure focus detected. Generalized atrophy with prominent mesial temporal involvement correlating with history of Alzheimer's disease CT CERVICAL SPINE FINDINGS Negative for acute fracture or subluxation. No prevertebral edema. No gross cervical canal hematoma. Degenerative changes without significant osseous canal or foraminal stenosis. Incidental small right cervical rib IMPRESSION: 1. Motion degraded head CT without evidence of intracranial injury. 2. Large left frontal scalp hematoma without fracture. 3. No evidence of cervical spine injury. Electronically Signed   By: Marnee Spring M.D.   On: 06/01/2015 08:09   Dg Shoulder Left  06/01/2015  CLINICAL DATA:  Seizure, falling onto hardwood floor with forehead hematoma. Diffuse left shoulder pain. History of dementia. Initial encounter. EXAM: LEFT SHOULDER - 2+ VIEW COMPARISON:  None. FINDINGS: There is no evidence of acute fracture or dislocation. No lytic or blastic osseous lesion or soft tissue abnormality is seen. IMPRESSION: Negative. Electronically Signed   By: Sebastian Ache M.D.   On: 06/01/2015 08:06       Assessment and plan per attending neurologist  Felicie Morn PA-C [facility] [phone]  06/01/2015, 10:15 AM   Assessment/Plan: 60 YO female with break though seizure. Etiology of breakthrough unknown as she is non-verbal. She is on Keppra 500 mg BID at home. Her last seizure was > 1 month ago.  Recommend: 1) Keppra 1 gram now 2) Keppra 750 MG BID 3) MRI brain with and without contrast 4) EEG 5) seizure precautions.

## 2015-06-01 NOTE — Progress Notes (Signed)
EEG Completed; Results Pending  

## 2015-06-01 NOTE — H&P (Signed)
[facility] History and Physical  FIZZA SCALES ZOX:[id number] DOB: [birth year] DOA: 06/01/2015  Referring physician:  Sarita Bottom PCP: Ron Parker, MD   Chief Complaint: Seizure  HPI: MYLIAH MEDEL is a 60 y.o. female  Ms. Leflore has a history of dementia and a seizure disorder.  She had a seizure and fall today witnessed by Nursing home staff.  Pt was brought to the Emergency Department by EMS. She was reported to be combative and having pain at the time.  Pt was given ativan combativeness and morphine for pain.  Pt hit her her head and her right shoulder.  Seizure occurred this am at approximately 6:00am.  History is by SNF,  EMS and ED staff.  Pt is reported to be verbal but confused at her baseline.  Pt is able to ambulate.  Pt had a ct head, MRI, and EEG. She has been seen by Neurology in the ED.  Pt has not returned to her baseline mental status.  Neurology request admission for observation until she returns to her normal function.  LEVEL 5 Caveat   Review of Systems:  LEVEL 5 Caveat  Pt has dementia.  Past Medical History  Diagnosis Date  . Hypertension   . Dementia   . Alzheimer's disease 06/17/2014  . Convulsions/seizures (HCC) 06/17/2014   History reviewed. No pertinent past surgical history. Social History:  reports that she has never smoked. She has never used smokeless tobacco. She reports that she does not drink alcohol or use illicit drugs.  No Known Allergies  Family History  Problem Relation Age of Onset  . Cancer Mother     lung  . Hypertension Sister   . Thyroid disease Sister   . Hypertension Brother   . Diabetes Brother      Prior to Admission medications   Medication Sig Start Date End Date Taking? Authorizing Provider  acetaminophen (TYLENOL) 500 MG tablet Take 500 mg by mouth every 4 (four) hours as needed for fever.   Yes Historical Provider, MD  Alum & Mag Hydroxide-Simeth (GERI-LANTA PO) Take 30 mLs by mouth every 6 (six) hours as needed  (heartburn/indigestion).   Yes Historical Provider, MD  brimonidine (ALPHAGAN P) 0.1 % SOLN Place 1 drop into both eyes 2 (two) times daily.   Yes Historical Provider, MD  brinzolamide (AZOPT) 1 % ophthalmic suspension Place 1 drop into both eyes 2 (two) times daily.   Yes Historical Provider, MD  Calcium Carbonate-Vitamin D (CALCIUM-D) 600-400 MG-UNIT TABS Take 1 tablet by mouth daily.   Yes Historical Provider, MD  ciprofloxacin (CIPRO) 250 MG tablet Take 250 mg by mouth 2 (two) times daily.   Yes Historical Provider, MD  guaiFENesin (ROBITUSSIN) 100 MG/5ML liquid Take 200 mg by mouth every 6 (six) hours as needed for cough.   Yes Historical Provider, MD  hydrocerin (EUCERIN) CREA Apply 1 application topically daily.   Yes Historical Provider, MD  lisinopril-hydrochlorothiazide (PRINZIDE,ZESTORETIC) 20-12.5 MG per tablet Take 1 tablet by mouth daily.   Yes Historical Provider, MD  loperamide (IMODIUM) 2 MG capsule Take 2 mg by mouth as needed for diarrhea or loose stools.   Yes Historical Provider, MD  loratadine (CLARITIN) 10 MG tablet Take 10 mg by mouth daily.   Yes Historical Provider, MD  LORazepam (ATIVAN) 0.5 MG tablet Take 0.5 mg by mouth every 8 (eight) hours as needed for anxiety.   Yes Historical Provider, MD  magnesium hydroxide (MILK OF MAGNESIA) 400 MG/5ML suspension Take 30 mLs by mouth  at bedtime as needed for mild constipation.   Yes Historical Provider, MD  memantine (NAMENDA) 10 MG tablet Take 1 tablet (10 mg total) by mouth 2 (two) times daily. 03/06/13  Yes Levert FeinsteinYijun Yan, MD  mirtazapine (REMERON) 15 MG tablet Take 15 mg by mouth at bedtime.   Yes Historical Provider, MD  mometasone (NASONEX) 50 MCG/ACT nasal spray Place 2 sprays into the nose daily.   Yes Historical Provider, MD  Multiple Vitamins-Minerals (PRESERVISION AREDS PO) Take 1 tablet by mouth 2 (two) times daily.   Yes Historical Provider, MD  neomycin-bacitracin-polymyxin (NEOSPORIN) [phone] ointment Apply 1  application topically as needed (until healed).   Yes Historical Provider, MD  sertraline (ZOLOFT) 50 MG tablet Take 1 tablet (50 mg total) by mouth daily. 03/06/13  Yes Levert FeinsteinYijun Yan, MD  simvastatin (ZOCOR) 10 MG tablet Take 10 mg by mouth at bedtime.   Yes Historical Provider, MD  Travoprost, BAK Free, (TRAVATAN) 0.004 % SOLN ophthalmic solution Place 1 drop into both eyes at bedtime.   Yes Historical Provider, MD  levETIRAcetam (KEPPRA) 750 MG tablet Take 1 tablet (750 mg total) by mouth 2 (two) times daily. 06/01/15   Wynetta EmeryNicole Pisciotta, PA-C   Physical Exam: Filed Vitals:   06/01/15 0900 06/01/15 0945 06/01/15 1330 06/01/15 1415  BP: 158/65 148/76 148/73 161/74  Pulse: 85 74 89 77  Temp:      Resp: 14 15 15 14   SpO2: 100% 100% 100% 100%    Wt Readings from Last 3 Encounters:  01/04/15 69.854 kg (154 lb)  06/17/14 69.219 kg (152 lb 9.6 oz)  04/17/14 63.504 kg (140 lb)    General:  Sleepy, arouses to voice,  Eyes: PERRL, hematoma left forehead/face and eyelid  ENT: grossly normal hearing, lips & tongue Neck: no LAD, masses or thyromegaly Cardiovascular: RRR, no m/r/g. No LE edema. Telemetry: SR, no arrhythmias  Respiratory: CTA bilaterally, no w/r/r. Normal respiratory effort. Abdomen: soft, ntnd Skin: no rash or induration seen on limited exam Musculoskeletal: grossly normal tone BUE/BLE. 8 cm bruise left shoulder. Psy  Pt sleeping, arouses to verbal stimulus, responds verbally but makes noise only.   Neurologic: grossly non-focal, moves all extremities  Unable to comply with further testing          Labs on Admission:  Basic Metabolic Panel:  Recent Labs Lab 06/01/15 0730 06/01/15 0736  NA  --  141  K  --  3.6  CL  --  110  CO2  --  22  GLUCOSE  --  98  BUN  --  17  CREATININE  --  1.02*  CALCIUM  --  9.1  MG 2.0  --   PHOS 2.2*  --    Liver Function Tests:  Recent Labs Lab 06/01/15 0736  AST 32  ALT 26  ALKPHOS 65  BILITOT 0.5  PROT 6.2*  ALBUMIN 3.3*     No results for input(s): LIPASE, AMYLASE in the last 168 hours. No results for input(s): AMMONIA in the last 168 hours. CBC:  Recent Labs Lab 06/01/15 0736  WBC 6.1  NEUTROABS 3.8  HGB 11.3*  HCT 35.7*  MCV 81.1  PLT 216   Cardiac Enzymes: No results for input(s): CKTOTAL, CKMB, CKMBINDEX, TROPONINI in the last 168 hours.  BNP (last 3 results) No results for input(s): BNP in the last 8760 hours.  ProBNP (last 3 results) No results for input(s): PROBNP in the last 8760 hours.  CBG: No results for input(s): GLUCAP in  the last 168 hours.  Radiological Exams on Admission: Dg Chest 2 View  06/01/2015  CLINICAL DATA:  Seizure. Struck head on floor. Dementia. Chest pain. EXAM: CHEST  2 VIEW COMPARISON:  04/17/2014 FINDINGS: Tortuous thoracic aorta. Thoracic spondylosis. Spurring of the glenoid rims, right greater than left. Accounting for the position of the patient's hands over the lung bases on the frontal projection, the lungs appear clear. No pneumothorax. No significant pleural effusion identified. IMPRESSION: 1. No active cardiopulmonary disease is radiographically apparent. 2. Thoracic spondylosis. 3. Degenerative glenoid spurring. No glenohumeral malalignment identified. Electronically Signed   By: Gaylyn Rong M.D.   On: 06/01/2015 13:20   Dg Shoulder Right  06/01/2015  CLINICAL DATA:  Seizure, falling onto hardwood floor with hematoma to the left forehead and right shoulder. History of dementia. Initial encounter. EXAM: RIGHT SHOULDER - 2+ VIEW COMPARISON:  Chest radiograph 04/17/2014. FINDINGS: No acute fracture or dislocation is identified. 1.4 cm ossicle adjacent to the inferior aspect of the glenoid is unchanged and may reflect an intra-articular loose body or possibly large osteophyte. No lytic or blastic osseous lesion or radiopaque foreign body is seen. Mild superficial soft tissue swelling is noted over the shoulder. IMPRESSION: 1. No acute osseous abnormality  identified. 2. Possible loose body at the inferior aspect of the glenohumeral joint. Electronically Signed   By: Sebastian Ache M.D.   On: 06/01/2015 13:24   Ct Head Wo Contrast  06/01/2015  CLINICAL DATA:  Fall with head trauma and seizure. EXAM: CT HEAD WITHOUT CONTRAST CT CERVICAL SPINE WITHOUT CONTRAST TECHNIQUE: Multidetector CT imaging of the head and cervical spine was performed following the standard protocol without intravenous contrast. Multiplanar CT image reconstructions of the cervical spine were also generated. COMPARISON:  05/17/2014 FINDINGS: CT HEAD FINDINGS Despite 2 acquisitions there still moderate motion limitation which could obscure pathology. This is the best obtainable study currently. Skull and Sinuses:There is a large hematoma in the left frontal scalp without underlying fracture or opaque foreign body. No sinus or mastoid fluid level. Partly visualized periapical erosion around the right upper canine. Visualized orbits: No evidence of injury Brain: No evidence of acute infarction, hemorrhage, hydrocephalus, or mass lesion/mass effect. No seizure focus detected. Generalized atrophy with prominent mesial temporal involvement correlating with history of Alzheimer's disease CT CERVICAL SPINE FINDINGS Negative for acute fracture or subluxation. No prevertebral edema. No gross cervical canal hematoma. Degenerative changes without significant osseous canal or foraminal stenosis. Incidental small right cervical rib IMPRESSION: 1. Motion degraded head CT without evidence of intracranial injury. 2. Large left frontal scalp hematoma without fracture. 3. No evidence of cervical spine injury. Electronically Signed   By: Marnee Spring M.D.   On: 06/01/2015 08:09   Ct Cervical Spine Wo Contrast  06/01/2015  CLINICAL DATA:  Fall with head trauma and seizure. EXAM: CT HEAD WITHOUT CONTRAST CT CERVICAL SPINE WITHOUT CONTRAST TECHNIQUE: Multidetector CT imaging of the head and cervical spine was  performed following the standard protocol without intravenous contrast. Multiplanar CT image reconstructions of the cervical spine were also generated. COMPARISON:  05/17/2014 FINDINGS: CT HEAD FINDINGS Despite 2 acquisitions there still moderate motion limitation which could obscure pathology. This is the best obtainable study currently. Skull and Sinuses:There is a large hematoma in the left frontal scalp without underlying fracture or opaque foreign body. No sinus or mastoid fluid level. Partly visualized periapical erosion around the right upper canine. Visualized orbits: No evidence of injury Brain: No evidence of acute infarction, hemorrhage, hydrocephalus, or  mass lesion/mass effect. No seizure focus detected. Generalized atrophy with prominent mesial temporal involvement correlating with history of Alzheimer's disease CT CERVICAL SPINE FINDINGS Negative for acute fracture or subluxation. No prevertebral edema. No gross cervical canal hematoma. Degenerative changes without significant osseous canal or foraminal stenosis. Incidental small right cervical rib IMPRESSION: 1. Motion degraded head CT without evidence of intracranial injury. 2. Large left frontal scalp hematoma without fracture. 3. No evidence of cervical spine injury. Electronically Signed   By: Marnee Spring M.D.   On: 06/01/2015 08:09   Mr Laqueta Jean ZO Contrast  06/01/2015  CLINICAL DATA:  Two witnessed seizures. Known seizure disorder. Scalp hematoma. EXAM: MRI HEAD WITHOUT AND WITH CONTRAST TECHNIQUE: Multiplanar, multiecho pulse sequences of the brain and surrounding structures were obtained without and with intravenous contrast. CONTRAST:  15 mL MultiHance COMPARISON:  CT head without contrast from the same day. FINDINGS: The large left supraorbital scalp hematoma is again noted. No significant intracranial extra-axial hemorrhage is present. There is no parenchymal contusion. No acute infarct, hemorrhage, or mass lesion is present. Moderate  generalized atrophy is present. Minimal white matter disease is noted. Mild white matter changes extend into the brainstem. The internal auditory canals are within normal limits. The cerebellum is unremarkable. Flow is present in the major intracranial arteries. The globes and orbits are intact. Minimal mucosal thickening is present in the left maxillary sinus. The remaining paranasal sinuses and the mastoid air cells are clear. Dedicated imaging of the temporal lobes demonstrates symmetric hippocampal atrophy. The postcontrast images demonstrate no pathologic enhancement. The skullbase is within normal limits. The midline sagittal images are within normal limits. IMPRESSION: 1. No acute or focal lesion to explain the patient's seizures. 2. Moderate generalized atrophy without significant white matter disease. This is likely related to chronic ischemia. 3. Large left frontal and supraorbital scalp hematoma. No significant intracranial hemorrhage is present. Electronically Signed   By: Marin Roberts M.D.   On: 06/01/2015 13:04   Dg Shoulder Left  06/01/2015  CLINICAL DATA:  Seizure, falling onto hardwood floor with forehead hematoma. Diffuse left shoulder pain. History of dementia. Initial encounter. EXAM: LEFT SHOULDER - 2+ VIEW COMPARISON:  None. FINDINGS: There is no evidence of acute fracture or dislocation. No lytic or blastic osseous lesion or soft tissue abnormality is seen. IMPRESSION: Negative. Electronically Signed   By: Sebastian Ache M.D.   On: 06/01/2015 08:06    EKG: Independently reviewed.   Assessment/Plan Active Problems:   Seizure (HCC)   Altered mental status   1. Seizure: breakthrough seizure.   Pt had evaluation in ED by Neurology.  Pt had normal MRi and normal EEG in ED.  Pt started on IV Keppra  Neurology advised increase Keppra from 500mg  bid to Keppra 750 mg bid. They advised observation until patient returns to her normal mental status  2. Contusion Forehead. Ice to  are  3. Dementia  Will monitor. Pt is reported to be verbal but confused.  Pt is normally ambulatory.  4. Altered Mental Status: This may be due to seizure, medication sedation of possible concussion post fall.   Pt seen by Dr, Lavon Paganini and Felicie Morn PA.  They recommended observation until pt   Code Status: full code DVT Prophylaxis: lovenox Family Communication: no family present Disposition Plan: Admission for observation. Neurology advised monitor until she return to baseline mental status.   Time spent: 58  [facility] [email or web address] password

## 2015-06-01 NOTE — Progress Notes (Signed)
Pt arrived to 5M17 @ 1620 from ED. Pt VS taken, pt on O2 3L [place].  Pt without distress.  Safety sitter at the bedside. Diet ordered, will monitor.

## 2015-06-01 NOTE — ED Notes (Addendum)
NT and RN performing in and out cath pt started to seize. Notified PA. Pt's hands drew up and pt's eyes closed with jaw clinched. Rolled pt on her side. Lasted approx 1 minute. Gave 2mg  ativan per verbal order Pt post ictal sleeping

## 2015-06-01 NOTE — ED Notes (Signed)
Per witnesses at the SNF, pt had a seizure and fell to the floor. Pt fell on hardwood floor and sustained a large hematoma to left side of forehead. Pt is a dementia patient that is at her baseline at this time.

## 2015-06-01 NOTE — ED Provider Notes (Signed)
CSN: [id number]     Arrival date & time 06/01/15  0700 History   First MD Initiated Contact with Patient 06/01/15 [phone]     Chief Complaint  Patient presents with  . Fall     (Consider location/radiation/quality/duration/timing/severity/associated sxs/prior Treatment) HPI   Blood pressure 194/114, pulse 100, temperature 97.3 F (36.3 C), SpO2 100 %.  Alexandria Lowe is a 60 y.o. female with PMH of HTN, dementia, brought in by EMS for witnessed seizure activity at SNF. As per EMS staff at nursing home states that this patient is mentating at her baseline, she is nonverbal but follows commands intermittently. Level V caveat secondary to dementia.  Last seiure > 1 month, guilf neuro, patient was in her normal state of health yesterday, and Lipitor and smiling, making nonsensical words. No fevers chills vomiting cough.  Past Medical History  Diagnosis Date  . Hypertension   . Dementia   . Alzheimer's disease 06/17/2014  . Convulsions/seizures (HCC) 06/17/2014   History reviewed. No pertinent past surgical history. Family History  Problem Relation Age of Onset  . Cancer Mother     lung  . Hypertension Sister   . Thyroid disease Sister   . Hypertension Brother   . Diabetes Brother    Social History  Substance Use Topics  . Smoking status: Never Smoker   . Smokeless tobacco: Never Used  . Alcohol Use: No   OB History    No data available     Review of Systems  Unable to perform ROS: Dementia      Allergies  Review of patient's allergies indicates no known allergies.  Home Medications   Prior to Admission medications   Medication Sig Start Date End Date Taking? Authorizing Provider  acetaminophen (TYLENOL) 500 MG tablet Take 500 mg by mouth every 4 (four) hours as needed for fever.   Yes Historical Provider, MD  Alum & Mag Hydroxide-Simeth (GERI-LANTA PO) Take 30 mLs by mouth every 6 (six) hours as needed (heartburn/indigestion).   Yes Historical Provider, MD   brimonidine (ALPHAGAN P) 0.1 % SOLN Place 1 drop into both eyes 2 (two) times daily.   Yes Historical Provider, MD  brinzolamide (AZOPT) 1 % ophthalmic suspension Place 1 drop into both eyes 2 (two) times daily.   Yes Historical Provider, MD  Calcium Carbonate-Vitamin D (CALCIUM-D) 600-400 MG-UNIT TABS Take 1 tablet by mouth daily.   Yes Historical Provider, MD  ciprofloxacin (CIPRO) 250 MG tablet Take 250 mg by mouth 2 (two) times daily.   Yes Historical Provider, MD  guaiFENesin (ROBITUSSIN) 100 MG/5ML liquid Take 200 mg by mouth every 6 (six) hours as needed for cough.   Yes Historical Provider, MD  hydrocerin (EUCERIN) CREA Apply 1 application topically daily.   Yes Historical Provider, MD  lisinopril-hydrochlorothiazide (PRINZIDE,ZESTORETIC) 20-12.5 MG per tablet Take 1 tablet by mouth daily.   Yes Historical Provider, MD  loperamide (IMODIUM) 2 MG capsule Take 2 mg by mouth as needed for diarrhea or loose stools.   Yes Historical Provider, MD  loratadine (CLARITIN) 10 MG tablet Take 10 mg by mouth daily.   Yes Historical Provider, MD  LORazepam (ATIVAN) 0.5 MG tablet Take 0.5 mg by mouth every 8 (eight) hours as needed for anxiety.   Yes Historical Provider, MD  magnesium hydroxide (MILK OF MAGNESIA) 400 MG/5ML suspension Take 30 mLs by mouth at bedtime as needed for mild constipation.   Yes Historical Provider, MD  memantine (NAMENDA) 10 MG tablet Take 1 tablet (10  mg total) by mouth 2 (two) times daily. 03/06/13  Yes Levert Feinstein, MD  mirtazapine (REMERON) 15 MG tablet Take 15 mg by mouth at bedtime.   Yes Historical Provider, MD  mometasone (NASONEX) 50 MCG/ACT nasal spray Place 2 sprays into the nose daily.   Yes Historical Provider, MD  Multiple Vitamins-Minerals (PRESERVISION AREDS PO) Take 1 tablet by mouth 2 (two) times daily.   Yes Historical Provider, MD  neomycin-bacitracin-polymyxin (NEOSPORIN) [phone] ointment Apply 1 application topically as needed (until healed).   Yes  Historical Provider, MD  sertraline (ZOLOFT) 50 MG tablet Take 1 tablet (50 mg total) by mouth daily. 03/06/13  Yes Levert Feinstein, MD  simvastatin (ZOCOR) 10 MG tablet Take 10 mg by mouth at bedtime.   Yes Historical Provider, MD  Travoprost, BAK Free, (TRAVATAN) 0.004 % SOLN ophthalmic solution Place 1 drop into both eyes at bedtime.   Yes Historical Provider, MD  levETIRAcetam (KEPPRA) 750 MG tablet Take 1 tablet (750 mg total) by mouth 2 (two) times daily. 06/01/15   Beryl Hornberger, PA-C   BP 161/74 mmHg  Pulse 77  Temp(Src) 97.3 F (36.3 C)  Resp 14  SpO2 100% Physical Exam  Constitutional: She appears well-developed and well-nourished.  HENT:  Mouth/Throat: Oropharynx is clear and moist.  Large hematoma to left forehead  Eyes: Conjunctivae are normal. Pupils are equal, round, and reactive to light.  Neck:  Rigid c-collar in place  Cardiovascular: Normal rate.   Pulmonary/Chest: Effort normal and breath sounds normal. No respiratory distress. She has no wheezes. She has no rales. She exhibits no tenderness.  Abdominal: Soft. Bowel sounds are normal. There is no tenderness.  Musculoskeletal:  Abrasion to anterior aspect of left shoulder  Neurological: She is alert.  Moving all extremities, following commands  Nonsensical words  Skin: Skin is warm.    ED Course  Procedures (including critical care time) Labs Review Labs Reviewed  CBC WITH DIFFERENTIAL/PLATELET - Abnormal; Notable for the following:    Hemoglobin 11.3 (*)    HCT 35.7 (*)    MCH 25.7 (*)    All other components within normal limits  COMPREHENSIVE METABOLIC PANEL - Abnormal; Notable for the following:    Creatinine, Ser 1.02 (*)    Total Protein 6.2 (*)    Albumin 3.3 (*)    GFR calc non Af Amer 59 (*)    All other components within normal limits  PHOSPHORUS - Abnormal; Notable for the following:    Phosphorus 2.2 (*)    All other components within normal limits  CULTURE, BLOOD (ROUTINE X 2)  CULTURE,  BLOOD (ROUTINE X 2)  URINE CULTURE  URINALYSIS, ROUTINE W REFLEX MICROSCOPIC (NOT AT [facility])  MAGNESIUM  LEVETIRACETAM LEVEL    Imaging Review Dg Chest 2 View  06/01/2015  CLINICAL DATA:  Seizure. Struck head on floor. Dementia. Chest pain. EXAM: CHEST  2 VIEW COMPARISON:  04/17/2014 FINDINGS: Tortuous thoracic aorta. Thoracic spondylosis. Spurring of the glenoid rims, right greater than left. Accounting for the position of the patient's hands over the lung bases on the frontal projection, the lungs appear clear. No pneumothorax. No significant pleural effusion identified. IMPRESSION: 1. No active cardiopulmonary disease is radiographically apparent. 2. Thoracic spondylosis. 3. Degenerative glenoid spurring. No glenohumeral malalignment identified. Electronically Signed   By: Gaylyn Rong M.D.   On: 06/01/2015 13:20   Dg Shoulder Right  06/01/2015  CLINICAL DATA:  Seizure, falling onto hardwood floor with hematoma to the left forehead and right shoulder.  History of dementia. Initial encounter. EXAM: RIGHT SHOULDER - 2+ VIEW COMPARISON:  Chest radiograph 04/17/2014. FINDINGS: No acute fracture or dislocation is identified. 1.4 cm ossicle adjacent to the inferior aspect of the glenoid is unchanged and may reflect an intra-articular loose body or possibly large osteophyte. No lytic or blastic osseous lesion or radiopaque foreign body is seen. Mild superficial soft tissue swelling is noted over the shoulder. IMPRESSION: 1. No acute osseous abnormality identified. 2. Possible loose body at the inferior aspect of the glenohumeral joint. Electronically Signed   By: Sebastian Ache M.D.   On: 06/01/2015 13:24   Ct Head Wo Contrast  06/01/2015  CLINICAL DATA:  Fall with head trauma and seizure. EXAM: CT HEAD WITHOUT CONTRAST CT CERVICAL SPINE WITHOUT CONTRAST TECHNIQUE: Multidetector CT imaging of the head and cervical spine was performed following the standard protocol without intravenous contrast. Multiplanar  CT image reconstructions of the cervical spine were also generated. COMPARISON:  05/17/2014 FINDINGS: CT HEAD FINDINGS Despite 2 acquisitions there still moderate motion limitation which could obscure pathology. This is the best obtainable study currently. Skull and Sinuses:There is a large hematoma in the left frontal scalp without underlying fracture or opaque foreign body. No sinus or mastoid fluid level. Partly visualized periapical erosion around the right upper canine. Visualized orbits: No evidence of injury Brain: No evidence of acute infarction, hemorrhage, hydrocephalus, or mass lesion/mass effect. No seizure focus detected. Generalized atrophy with prominent mesial temporal involvement correlating with history of Alzheimer's disease CT CERVICAL SPINE FINDINGS Negative for acute fracture or subluxation. No prevertebral edema. No gross cervical canal hematoma. Degenerative changes without significant osseous canal or foraminal stenosis. Incidental small right cervical rib IMPRESSION: 1. Motion degraded head CT without evidence of intracranial injury. 2. Large left frontal scalp hematoma without fracture. 3. No evidence of cervical spine injury. Electronically Signed   By: Marnee Spring M.D.   On: 06/01/2015 08:09   Ct Cervical Spine Wo Contrast  06/01/2015  CLINICAL DATA:  Fall with head trauma and seizure. EXAM: CT HEAD WITHOUT CONTRAST CT CERVICAL SPINE WITHOUT CONTRAST TECHNIQUE: Multidetector CT imaging of the head and cervical spine was performed following the standard protocol without intravenous contrast. Multiplanar CT image reconstructions of the cervical spine were also generated. COMPARISON:  05/17/2014 FINDINGS: CT HEAD FINDINGS Despite 2 acquisitions there still moderate motion limitation which could obscure pathology. This is the best obtainable study currently. Skull and Sinuses:There is a large hematoma in the left frontal scalp without underlying fracture or opaque foreign body. No  sinus or mastoid fluid level. Partly visualized periapical erosion around the right upper canine. Visualized orbits: No evidence of injury Brain: No evidence of acute infarction, hemorrhage, hydrocephalus, or mass lesion/mass effect. No seizure focus detected. Generalized atrophy with prominent mesial temporal involvement correlating with history of Alzheimer's disease CT CERVICAL SPINE FINDINGS Negative for acute fracture or subluxation. No prevertebral edema. No gross cervical canal hematoma. Degenerative changes without significant osseous canal or foraminal stenosis. Incidental small right cervical rib IMPRESSION: 1. Motion degraded head CT without evidence of intracranial injury. 2. Large left frontal scalp hematoma without fracture. 3. No evidence of cervical spine injury. Electronically Signed   By: Marnee Spring M.D.   On: 06/01/2015 08:09   Mr Laqueta Jean ZO Contrast  06/01/2015  CLINICAL DATA:  Two witnessed seizures. Known seizure disorder. Scalp hematoma. EXAM: MRI HEAD WITHOUT AND WITH CONTRAST TECHNIQUE: Multiplanar, multiecho pulse sequences of the brain and surrounding structures were obtained without and with  intravenous contrast. CONTRAST:  15 mL MultiHance COMPARISON:  CT head without contrast from the same day. FINDINGS: The large left supraorbital scalp hematoma is again noted. No significant intracranial extra-axial hemorrhage is present. There is no parenchymal contusion. No acute infarct, hemorrhage, or mass lesion is present. Moderate generalized atrophy is present. Minimal white matter disease is noted. Mild white matter changes extend into the brainstem. The internal auditory canals are within normal limits. The cerebellum is unremarkable. Flow is present in the major intracranial arteries. The globes and orbits are intact. Minimal mucosal thickening is present in the left maxillary sinus. The remaining paranasal sinuses and the mastoid air cells are clear. Dedicated imaging of the  temporal lobes demonstrates symmetric hippocampal atrophy. The postcontrast images demonstrate no pathologic enhancement. The skullbase is within normal limits. The midline sagittal images are within normal limits. IMPRESSION: 1. No acute or focal lesion to explain the patient's seizures. 2. Moderate generalized atrophy without significant white matter disease. This is likely related to chronic ischemia. 3. Large left frontal and supraorbital scalp hematoma. No significant intracranial hemorrhage is present. Electronically Signed   By: Marin Robertshristopher  Mattern M.D.   On: 06/01/2015 13:04   Dg Shoulder Left  06/01/2015  CLINICAL DATA:  Seizure, falling onto hardwood floor with forehead hematoma. Diffuse left shoulder pain. History of dementia. Initial encounter. EXAM: LEFT SHOULDER - 2+ VIEW COMPARISON:  None. FINDINGS: There is no evidence of acute fracture or dislocation. No lytic or blastic osseous lesion or soft tissue abnormality is seen. IMPRESSION: Negative. Electronically Signed   By: Sebastian AcheAllen  Grady M.D.   On: 06/01/2015 08:06   I have personally reviewed and evaluated these images and lab results as part of my medical decision-making.   EKG Interpretation   Date/Time:  Wednesday June 01 2015 07:07:04 EST Ventricular Rate:  72 PR Interval:  155 QRS Duration: 99 QT Interval:  421 QTC Calculation: 461 R Axis:   70 Text Interpretation:  Sinus rhythm Abnormal R-wave progression, early  transition Minimal ST depression, anterolateral leads Baseline wander in  lead(s) V1 Artifact No significant change since last tracing Confirmed by  NGUYEN, EMILY ([id number]) on 06/01/2015 7:22:36 AM      MDM   Final diagnoses:  Seizure (HCC)  Altered mental status, unspecified altered mental status type    Filed Vitals:   06/01/15 0900 06/01/15 0945 06/01/15 1330 06/01/15 1415  BP: 158/65 148/76 148/73 161/74  Pulse: 85 74 89 77  Temp:      Resp: 14 15 15 14   SpO2: 100% 100% 100% 100%    Medications   0.9 %  sodium chloride infusion ( Intravenous New Bag/Given 06/01/15 0727)  levETIRAcetam (KEPPRA) 750 mg in sodium chloride 0.9 % 100 mL IVPB (0 mg Intravenous Stopped 06/01/15 1418)  morphine 4 MG/ML injection 4 mg (4 mg Intravenous Given 06/01/15 0728)  LORazepam (ATIVAN) 2 MG/ML injection (  Duplicate 06/01/15 0836)  levETIRAcetam (KEPPRA) 1,000 mg in sodium chloride 0.9 % 100 mL IVPB (0 mg Intravenous Stopped 06/01/15 1033)  LORazepam (ATIVAN) injection 2 mg (2 mg Intravenous Given 06/01/15 0830)  gadobenate dimeglumine (MULTIHANCE) injection 15 mL (15 mLs Intravenous Contrast Given 06/01/15 1214)    Alexandria Lowe is 60 y.o. female presenting with witnessed seizure and head trauma at SNF, patient is mentating at her baseline. As per staff at SNF patient was in her typical state of health until yesterday, compliant with her Keppra 500 twice a day.  8:25 AM: Alerted by nurse the  patient was seizing. DC'd c-collar as results are negative. Patient is placed in the left lateral decubitus position, nasal cannula applied and patient is suctioned to protect airway. Seizure DC'd after approximately 2 minutes, 2 mg of Ativan given at tail end of seizure. Patient will be loaded with Keppra. Head CT negative  Blood pressure has improved to 158/65, patient reassessed at 9:30 AM, she is less responsive than normal, opening her eyes to noxious stimuli, nonverbal at this time.  Neurology consult from Dr. Gasper Lloyd appreciated: He will come to the ED and evaluate her and do an EEG. He recommends culturing the patient and changing Keppra to 750 twice a day.  MRI and EEG unremarkable.  Patient remains somnolent, this is not typical for her baseline, discussed with neurologist Dr. Margaretann Loveless requests observation admission until she returns to baseline.   Wynetta Emery, PA-C 06/01/15 1520  Leta Baptist, MD 06/04/15 1008

## 2015-06-02 DIAGNOSIS — R4182 Altered mental status, unspecified: Secondary | ICD-10-CM | POA: Diagnosis not present

## 2015-06-02 DIAGNOSIS — R569 Unspecified convulsions: Secondary | ICD-10-CM | POA: Diagnosis not present

## 2015-06-02 DIAGNOSIS — T148XXA Other injury of unspecified body region, initial encounter: Secondary | ICD-10-CM | POA: Insufficient documentation

## 2015-06-02 DIAGNOSIS — T148 Other injury of unspecified body region: Secondary | ICD-10-CM | POA: Diagnosis not present

## 2015-06-02 LAB — URINE CULTURE: Culture: NO GROWTH

## 2015-06-02 LAB — CBC
HEMATOCRIT: 35.1 % — AB (ref 36.0–46.0)
Hemoglobin: 11 g/dL — ABNORMAL LOW (ref 12.0–15.0)
MCH: 25.3 pg — AB (ref 26.0–34.0)
MCHC: 31.3 g/dL (ref 30.0–36.0)
MCV: 80.9 fL (ref 78.0–100.0)
PLATELETS: 217 10*3/uL (ref 150–400)
RBC: 4.34 MIL/uL (ref 3.87–5.11)
RDW: 14.1 % (ref 11.5–15.5)
WBC: 8.6 10*3/uL (ref 4.0–10.5)

## 2015-06-02 LAB — BASIC METABOLIC PANEL
Anion gap: 7 (ref 5–15)
BUN: 7 mg/dL (ref 6–20)
CHLORIDE: 108 mmol/L (ref 101–111)
CO2: 26 mmol/L (ref 22–32)
Calcium: 8.8 mg/dL — ABNORMAL LOW (ref 8.9–10.3)
Creatinine, Ser: 0.83 mg/dL (ref 0.44–1.00)
GFR calc Af Amer: >60 mL/min (ref >60)
GFR calc non Af Amer: >60 mL/min (ref >60)
GLUCOSE: 91 mg/dL (ref 65–99)
POTASSIUM: 3.5 mmol/L (ref 3.5–5.1)
SODIUM: 141 mmol/L (ref 135–145)

## 2015-06-02 MED ORDER — LORAZEPAM 0.5 MG PO TABS: 0.5000 mg | ORAL_TABLET | Freq: Three times a day (TID) | ORAL | Status: DC | PRN

## 2015-06-02 MED ORDER — LEVETIRACETAM 750 MG PO TABS: 750.0000 mg | ORAL_TABLET | Freq: Two times a day (BID) | ORAL | Status: DC

## 2015-06-02 NOTE — Clinical Social Work Note (Signed)
Clinical Social Worker has contacted patient's son and sister in reference to discharge planning and left messages for returned phone calls in addition to informing family [facility].  [facility] medically [facility].   CSW will remain available as needed.   Derenda FennelBashira Kaliah Haddaway, MSW, LCSWA [phone] 06/02/2015 12:33 PM

## 2015-06-02 NOTE — Evaluation (Signed)
Physical Therapy Evaluation Patient Details Name: Alexandria Lowe MRN: [id number] DOB: 10/23/1955 Today's Date: 06/02/2015   History of Present Illness  Pt is a 60 y/o female who is a resident at an ALF. She had a witnessed seizure and fall and presented to the ED for further evaluation.   Clinical Impression  Pt admitted with above diagnosis. Pt currently with functional limitations due to the deficits listed below (see PT Problem List). At the time of PT eval pt was able to perform transfers and ambulation with min assist for both initiation of task as well as balance and support during ambulation. It is likely that pt is close to her baseline of function and feel this pt is appropriate to return to assisted living at d/c. Pt will benefit from skilled PT to increase their independence and safety with mobility to allow discharge to the venue listed below.       Follow Up Recommendations No PT follow up    Equipment Recommendations  None recommended by PT    Recommendations for Other Services       Precautions / Restrictions Precautions Precautions: Fall Precaution Comments: Dementia and nonsensical conversation at baseline Restrictions Weight Bearing Restrictions: No      Mobility  Bed Mobility Overal bed mobility: Needs Assistance Bed Mobility: Supine to Sit;Sit to Supine     Supine to sit: Min assist Sit to supine: Min assist   General bed mobility comments: Assist to initiate transfers. Pt was able to complete transition to EOB but required assist to elevate legs back into bed. Feel pt could have completed transfer back to supine without assist if given enough time.   Transfers Overall transfer level: Needs assistance Equipment used: 1 person hand held assist Transfers: Sit to/from Stand Sit to Stand: Min assist         General transfer comment: Steadying assist only to power-up to full standing position.   Ambulation/Gait Ambulation/Gait assistance: Min  assist Ambulation Distance (Feet): 150 Feet Assistive device: 1 person hand held assist;None Gait Pattern/deviations: Step-through pattern;Decreased stride length;Trunk flexed;Narrow base of support Gait velocity: Decreased Gait velocity interpretation: Below normal speed for age/gender General Gait Details: HHA initially progressing to no assist. Pt required multimodal cues for direction. Occasional LOB to the R side requiring assist to recover.   Stairs            Wheelchair Mobility    Modified Rankin (Stroke Patients Only)       Balance Overall balance assessment: Needs assistance Sitting-balance support: Feet supported;No upper extremity supported Sitting balance-Leahy Scale: Fair     Standing balance support: No upper extremity supported;During functional activity Standing balance-Leahy Scale: Fair                               Pertinent Vitals/Pain Pain Assessment: Faces Faces Pain Scale: No hurt    Home Living Family/patient expects to be discharged to:: Assisted living                      Prior Function           Comments: Pt unable to answer any history questions, however from chart review it appears that pt was ambulatory at baseline. Unclear how much assist was required for ADL's.      Hand Dominance        Extremity/Trunk Assessment   Upper Extremity Assessment: Difficult to assess due to impaired  cognition           Lower Extremity Assessment: Difficult to assess due to impaired cognition      Cervical / Trunk Assessment: Other exceptions  Communication   Communication: Expressive difficulties  Cognition Arousal/Alertness: Lethargic Behavior During Therapy: Flat affect Overall Cognitive Status: History of cognitive impairments - at baseline                      General Comments      Exercises        Assessment/Plan    PT Assessment Patient needs continued PT services  PT Diagnosis  Difficulty walking   PT Problem List Decreased strength;Decreased range of motion;Decreased activity tolerance;Decreased balance;Decreased mobility;Decreased knowledge of use of DME;Decreased safety awareness;Decreased knowledge of precautions  PT Treatment Interventions DME instruction;Gait training;Stair training;Functional mobility training;Therapeutic activities;Therapeutic exercise;Neuromuscular re-education;Patient/family education   PT Goals (Current goals can be found in the Care Plan section) Acute Rehab PT Goals PT Goal Formulation: Patient unable to participate in goal setting Time For Goal Achievement: 06/16/15 Potential to Achieve Goals: Good    Frequency Min 2X/week   Barriers to discharge        Co-evaluation               End of Session Equipment Utilized During Treatment: Gait belt Activity Tolerance: Patient tolerated treatment well Patient left: in bed;with call Febo/phone within reach;with nursing/sitter in room Nurse Communication: Mobility status    Functional Assessment Tool Used: Clinical judgement Functional Limitation: Mobility: Walking and moving around Mobility: Walking and Moving Around Current Status (Z6109): At least 20 percent but less than 40 percent impaired, limited or restricted Mobility: Walking and Moving Around Goal Status [phone]): At least 20 percent but less than 40 percent impaired, limited or restricted    Time: 1351-1405 PT Time Calculation (min) (ACUTE ONLY): 14 min   Charges:   PT Evaluation $PT Eval Moderate Complexity: 1 Procedure     PT G Codes:   PT G-Codes **NOT FOR INPATIENT CLASS** Functional Assessment Tool Used: Clinical judgement Functional Limitation: Mobility: Walking and moving around Mobility: Walking and Moving Around Current Status (U9811): At least 20 percent but less than 40 percent impaired, limited or restricted Mobility: Walking and Moving Around Goal Status [phone]): At least 20 percent but less than 40  percent impaired, limited or restricted    Conni Slipper 06/02/2015, 3:00 PM   Conni Slipper, PT, DPT Acute Rehabilitation Services Pager: [phone]

## 2015-06-02 NOTE — [place] FL2 (Signed)
[place] MEDICAID FL2 LEVEL OF CARE SCREENING TOOL     IDENTIFICATION  Patient Name: Alexandria Lowe Birthdate: 08-Jan-1956 Sex: female Admission Date (Current Location): 06/01/2015  [facility] and [place] Number:  Producer, television/film/video and Address:  [facility]. [facility], [address], [place], [place] [address]      Provider Number: [id number]  Attending Physician Name and Address:  Maretta Bees, MD  Relative Name and Phone Number:       Current Level of Care: Hospital Recommended Level of Care: Skilled Nursing Facility Prior Approval Number:    Date Approved/Denied:   PASRR Number:    Discharge Plan: SNF    Current Diagnoses: Patient Active Problem List   Diagnosis Date Noted  . Contusion   . Altered mental status 06/01/2015  . Seizure (HCC)   . Alzheimer's disease 06/17/2014  . Convulsions/seizures (HCC) 06/17/2014  . Hypertensive urgency 03/10/2013  . Hypokalemia 03/10/2013  . Pre-syncope 03/10/2013  . Dementia     Orientation RESPIRATION BLADDER Height & Weight    Self    Incontinent 5\' 5"  (165.1 cm) 154 lbs.  BEHAVIORAL SYMPTOMS/MOOD NEUROLOGICAL BOWEL NUTRITION STATUS  Wanderer  (NONE) Continent Diet  AMBULATORY STATUS COMMUNICATION OF NEEDS Skin   Independent Verbally Normal                       Personal Care Assistance Level of Assistance   (NONE )           Functional Limitations Info   (NONE)          SPECIAL CARE FACTORS FREQUENCY   (NONE)                    Contractures      Additional Factors Info  Code Status, Allergies Code Status Info: FULL CODE  Allergies Info: N/A           Current Medications (06/02/2015):  This is the current hospital active medication list Current Facility-Administered Medications  Medication Dose Route Frequency Provider Last Rate Last Dose  . acetaminophen (TYLENOL) tablet 500 mg  500 mg Oral Q4H PRN Elson Areas, PA-C      . alum & mag hydroxide-simeth  (MAALOX/MYLANTA) 200-200-20 MG/5ML suspension 15 mL  15 mL Oral Q6H PRN Elson Areas, PA-C      . brimonidine (ALPHAGAN) 0.15 % ophthalmic solution 1 drop  1 drop Both Eyes BID Lonia Skinner Sofia, PA-C      . brinzolamide (AZOPT) 1 % ophthalmic suspension 1 drop  1 drop Both Eyes BID Lonia Skinner Sofia, PA-C      . enoxaparin (LOVENOX) injection 40 mg  40 mg Subcutaneous Q24H Lonia Skinner [place], PA-C   40 mg at 06/01/15 2258  . fluticasone (FLONASE) 50 MCG/ACT nasal spray 1 spray  1 spray Each Nare Daily Lonia Skinner Sofia, PA-C      . hydrochlorothiazide (MICROZIDE) capsule 12.5 mg  12.5 mg Oral Daily Lonia Skinner Sofia, PA-C   12.5 mg at 06/01/15 1700  . latanoprost (XALATAN) 0.005 % ophthalmic solution 1 drop  1 drop Both Eyes QHS Lonia Skinner [place], PA-C   1 drop at 06/01/15 2257  . levETIRAcetam (KEPPRA) 750 mg in sodium chloride 0.9 % 100 mL IVPB  750 mg Intravenous Q12H Ulice Dash, PA-C 430 mL/hr at 06/02/15 1026 750 mg at 06/02/15 1026  . lisinopril (PRINIVIL,ZESTRIL) tablet 20 mg  20 mg Oral Daily Lonia Skinner  Sofia, PA-C   20 mg at 06/01/15 1700  . loratadine (CLARITIN) tablet 10 mg  10 mg Oral Daily Lonia SkinnerLeslie K [place]Sofia, PA-C   10 mg at 06/01/15 1530  . LORazepam (ATIVAN) tablet 0.5 mg  0.5 mg Oral Q8H PRN Elson AreasLeslie K Sofia, PA-C      . memantine [facility](NAMENDA) tablet 10 mg  10 mg Oral BID Lonia SkinnerLeslie K Sofia, PA-C   10 mg at 06/01/15 1700  . mirtazapine (REMERON) tablet 15 mg  15 mg Oral QHS Lonia SkinnerLeslie K [place], PA-C   15 mg at 06/01/15 2200  . sertraline (ZOLOFT) tablet 50 mg  50 mg Oral Daily Lonia SkinnerLeslie K [place]Sofia, PA-C   50 mg at 06/01/15 1530  . simvastatin (ZOCOR) tablet 10 mg  10 mg Oral QHS Lonia SkinnerLeslie K [place]Sofia, PA-C   10 mg at 06/01/15 2200     Discharge Medications: Please see discharge summary for a list of discharge medications.  Relevant Imaging Results:  Relevant Lab Results:   Additional Information SSN [id number]  Vaughan Brownerixon, Maesyn Frisinger A, LCSW

## 2015-06-02 NOTE — Clinical Social Work Note (Signed)
Clinical Social Work Assessment  Patient Details  Name: Alexandria Lowe MRN: [id number] Date of Birth: 01/20/1956  Date of referral:  06/02/15               Reason for consult:  Discharge Planning                Permission sought to share information with:  Family Supports, Magazine features editoracility Contact Representative, Sports coachCase Manager, Guardian Permission granted to share information::  Yes, Verbal Permission Granted  Name::      Desma Maxim(Christopher Luddy)  Agency::   ([facility] )  Relationship::   (Son)  Contact Information:     Housing/Transportation Living arrangements for the past 2 months:  Assisted Living Facility Source of Information:  Facility Patient Interpreter Needed:  None Criminal Activity/Legal Involvement Pertinent to Current Situation/Hospitalization:  No - Comment as needed Significant Relationships:  Adult Children, Siblings Lives with:  Facility Resident Do you feel safe going back to the place where you live?  Yes Need for family participation in patient care:  No (Coment)  Care giving concerns:  [facility].   Social Worker assessment / plan:  Visual merchandiserClinical Social Worker spoke with facility Production designer, theatre/television/filmadministrator who confirmed that patient is a resident of ALF and can return once medically stable. CSW has faxed clinicals for review. CSW has also contacted family and leaving a message to notify them of [facility]. CSW informed that patient has a legal guardian, however legal guardian is not listed as a contact. No further concerns available at this time. CSW remains available as needed.   Employment status:  Disabled (Comment on whether or not currently receiving Disability) Insurance information:  Managed Care PT Recommendations:  No Follow Up Information / Referral to community resources:   (ALF)  Patient/Family's Response to care:  Pt with advanced dementia. Facility agreeable to patient returning today,  1/5.  Patient/Family's Understanding of and Emotional Response to Diagnosis, Current Treatment, and Prognosis:  Family currently no available. Facility updated.   Emotional Assessment Appearance:  Developmentally appropriate Attitude/Demeanor/Rapport:  Unable to Assess Affect (typically observed):  Unable to Assess Orientation:  Oriented to Self Alcohol / Substance use:  Not Applicable Psych involvement (Current and /or in the community):  No (Comment)  Discharge Needs  Concerns to be addressed:  No discharge needs identified Readmission within the last 30 days:  No Current discharge risk:  None Barriers to Discharge:  No Barriers Identified   Derenda FennelBashira Edilson Vital, MSW, LCSWA [phone] 06/02/2015 4:14 PM

## 2015-06-02 NOTE — Clinical Social Work Note (Signed)
Clinical Social Worker facilitated patient discharge including contacting patient family and facility to confirm patient discharge plans.  Clinical information faxed to facility and family agreeable with plan.  CSW arranged [facility] via [facility].  RN to call report prior to discharge.  Clinical Social Worker will sign off for now as social work intervention is no longer needed. Please consult us again if new need arises.  Alexandria Lowe, MSW, LCSWA [phone] 06/02/2015 4:16 PM

## 2015-06-02 NOTE — Discharge Summary (Signed)
PATIENT DETAILS Name: Alexandria Lowe Age: 60 y.o. Sex: female Date of Birth: [birth year] MRN: [id number]. Admitting Physician: Ozella Rocks, MD WUJ:WJXBJ,YNWGNF, MD  Admit Date: 06/01/2015 Discharge date: 06/02/2015  Recommendations for Outpatient Follow-up:  1. Please ensure follow-up with neurology 2. Medication adjustment: Keppra 750 mg twice a day 3. Please repeat CBC/BMET in 1 week.  PRIMARY DISCHARGE DIAGNOSIS:  Active Problems:   Seizure (HCC)   Altered mental status   Contusion      PAST MEDICAL HISTORY: Past Medical History  Diagnosis Date  . Hypertension   . Convulsions/seizures (HCC) 06/17/2014  . Depression   . Alzheimer's disease 06/17/2014  . Nonverbal     "for awhile now"/sister (06/01/2015)    DISCHARGE MEDICATIONS: Current Discharge Medication List    CONTINUE these medications which have CHANGED   Details  levETIRAcetam (KEPPRA) 750 MG tablet Take 1 tablet (750 mg total) by mouth 2 (two) times daily.    LORazepam (ATIVAN) 0.5 MG tablet Take 1 tablet (0.5 mg total) by mouth every 8 (eight) hours as needed for anxiety. Qty: 30 tablet, Refills: 0      CONTINUE these medications which have NOT CHANGED   Details  acetaminophen (TYLENOL) 500 MG tablet Take 500 mg by mouth every 4 (four) hours as needed for fever.    Alum & Mag Hydroxide-Simeth (GERI-LANTA PO) Take 30 mLs by mouth every 6 (six) hours as needed (heartburn/indigestion).    brimonidine (ALPHAGAN P) 0.1 % SOLN Place 1 drop into both eyes 2 (two) times daily.    brinzolamide (AZOPT) 1 % ophthalmic suspension Place 1 drop into both eyes 2 (two) times daily.    Calcium Carbonate-Vitamin D (CALCIUM-D) 600-400 MG-UNIT TABS Take 1 tablet by mouth daily.    guaiFENesin (ROBITUSSIN) 100 MG/5ML liquid Take 200 mg by mouth every 6 (six) hours as needed for cough.    hydrocerin (EUCERIN) CREA Apply 1 application topically daily.    lisinopril-hydrochlorothiazide (PRINZIDE,ZESTORETIC) 20-12.5 MG  per tablet Take 1 tablet by mouth daily.    loperamide (IMODIUM) 2 MG capsule Take 2 mg by mouth as needed for diarrhea or loose stools.    loratadine (CLARITIN) 10 MG tablet Take 10 mg by mouth daily.    magnesium hydroxide (MILK OF MAGNESIA) 400 MG/5ML suspension Take 30 mLs by mouth at bedtime as needed for mild constipation.    memantine (NAMENDA) 10 MG tablet Take 1 tablet (10 mg total) by mouth 2 (two) times daily. Qty: 60 tablet, Refills: 12    mirtazapine (REMERON) 15 MG tablet Take 15 mg by mouth at bedtime.    mometasone (NASONEX) 50 MCG/ACT nasal spray Place 2 sprays into the nose daily.    Multiple Vitamins-Minerals (PRESERVISION AREDS PO) Take 1 tablet by mouth 2 (two) times daily.    neomycin-bacitracin-polymyxin (NEOSPORIN) [phone] ointment Apply 1 application topically as needed (until healed).    sertraline (ZOLOFT) 50 MG tablet Take 1 tablet (50 mg total) by mouth daily. Qty: 30 tablet, Refills: 12    simvastatin (ZOCOR) 10 MG tablet Take 10 mg by mouth at bedtime.    Travoprost, BAK Free, (TRAVATAN) 0.004 % SOLN ophthalmic solution Place 1 drop into both eyes at bedtime.      STOP taking these medications     ciprofloxacin (CIPRO) 250 MG tablet         ALLERGIES:  No Known Allergies  BRIEF HPI:  See H&P, Labs, Consult and Test reports for all details in brief, patient was admitted  for evaluation of seizure, and prolonged postictal state.  CONSULTATIONS:   neurology  PERTINENT RADIOLOGIC STUDIES: Dg Chest 2 View  06/01/2015  CLINICAL DATA:  Seizure. Struck head on floor. Dementia. Chest pain. EXAM: CHEST  2 VIEW COMPARISON:  04/17/2014 FINDINGS: Tortuous thoracic aorta. Thoracic spondylosis. Spurring of the glenoid rims, right greater than left. Accounting for the position of the patient's hands over the lung bases on the frontal projection, the lungs appear clear. No pneumothorax. No significant pleural effusion identified. IMPRESSION: 1. No active  cardiopulmonary disease is radiographically apparent. 2. Thoracic spondylosis. 3. Degenerative glenoid spurring. No glenohumeral malalignment identified. Electronically Signed   By: Gaylyn Rong M.D.   On: 06/01/2015 13:20   Dg Shoulder Right  06/01/2015  CLINICAL DATA:  Seizure, falling onto hardwood floor with hematoma to the left forehead and right shoulder. History of dementia. Initial encounter. EXAM: RIGHT SHOULDER - 2+ VIEW COMPARISON:  Chest radiograph 04/17/2014. FINDINGS: No acute fracture or dislocation is identified. 1.4 cm ossicle adjacent to the inferior aspect of the glenoid is unchanged and may reflect an intra-articular loose body or possibly large osteophyte. No lytic or blastic osseous lesion or radiopaque foreign body is seen. Mild superficial soft tissue swelling is noted over the shoulder. IMPRESSION: 1. No acute osseous abnormality identified. 2. Possible loose body at the inferior aspect of the glenohumeral joint. Electronically Signed   By: Sebastian Ache M.D.   On: 06/01/2015 13:24   Ct Head Wo Contrast  06/01/2015  CLINICAL DATA:  Fall with head trauma and seizure. EXAM: CT HEAD WITHOUT CONTRAST CT CERVICAL SPINE WITHOUT CONTRAST TECHNIQUE: Multidetector CT imaging of the head and cervical spine was performed following the standard protocol without intravenous contrast. Multiplanar CT image reconstructions of the cervical spine were also generated. COMPARISON:  05/17/2014 FINDINGS: CT HEAD FINDINGS Despite 2 acquisitions there still moderate motion limitation which could obscure pathology. This is the best obtainable study currently. Skull and Sinuses:There is a large hematoma in the left frontal scalp without underlying fracture or opaque foreign body. No sinus or mastoid fluid level. Partly visualized periapical erosion around the right upper canine. Visualized orbits: No evidence of injury Brain: No evidence of acute infarction, hemorrhage, hydrocephalus, or mass lesion/mass  effect. No seizure focus detected. Generalized atrophy with prominent mesial temporal involvement correlating with history of Alzheimer's disease CT CERVICAL SPINE FINDINGS Negative for acute fracture or subluxation. No prevertebral edema. No gross cervical canal hematoma. Degenerative changes without significant osseous canal or foraminal stenosis. Incidental small right cervical rib IMPRESSION: 1. Motion degraded head CT without evidence of intracranial injury. 2. Large left frontal scalp hematoma without fracture. 3. No evidence of cervical spine injury. Electronically Signed   By: Marnee Spring M.D.   On: 06/01/2015 08:09   Ct Cervical Spine Wo Contrast  06/01/2015  CLINICAL DATA:  Fall with head trauma and seizure. EXAM: CT HEAD WITHOUT CONTRAST CT CERVICAL SPINE WITHOUT CONTRAST TECHNIQUE: Multidetector CT imaging of the head and cervical spine was performed following the standard protocol without intravenous contrast. Multiplanar CT image reconstructions of the cervical spine were also generated. COMPARISON:  05/17/2014 FINDINGS: CT HEAD FINDINGS Despite 2 acquisitions there still moderate motion limitation which could obscure pathology. This is the best obtainable study currently. Skull and Sinuses:There is a large hematoma in the left frontal scalp without underlying fracture or opaque foreign body. No sinus or mastoid fluid level. Partly visualized periapical erosion around the right upper canine. Visualized orbits: No evidence of injury Brain:  No evidence of acute infarction, hemorrhage, hydrocephalus, or mass lesion/mass effect. No seizure focus detected. Generalized atrophy with prominent mesial temporal involvement correlating with history of Alzheimer's disease CT CERVICAL SPINE FINDINGS Negative for acute fracture or subluxation. No prevertebral edema. No gross cervical canal hematoma. Degenerative changes without significant osseous canal or foraminal stenosis. Incidental small right cervical  rib IMPRESSION: 1. Motion degraded head CT without evidence of intracranial injury. 2. Large left frontal scalp hematoma without fracture. 3. No evidence of cervical spine injury. Electronically Signed   By: Marnee SpringJonathon  Watts M.D.   On: 06/01/2015 08:09   Mr Laqueta JeanBrain W ZOWo Contrast  06/01/2015  CLINICAL DATA:  Two witnessed seizures. Known seizure disorder. Scalp hematoma. EXAM: MRI HEAD WITHOUT AND WITH CONTRAST TECHNIQUE: Multiplanar, multiecho pulse sequences of the brain and surrounding structures were obtained without and with intravenous contrast. CONTRAST:  15 mL MultiHance COMPARISON:  CT head without contrast from the same day. FINDINGS: The large left supraorbital scalp hematoma is again noted. No significant intracranial extra-axial hemorrhage is present. There is no parenchymal contusion. No acute infarct, hemorrhage, or mass lesion is present. Moderate generalized atrophy is present. Minimal white matter disease is noted. Mild white matter changes extend into the brainstem. The internal auditory canals are within normal limits. The cerebellum is unremarkable. Flow is present in the major intracranial arteries. The globes and orbits are intact. Minimal mucosal thickening is present in the left maxillary sinus. The remaining paranasal sinuses and the mastoid air cells are clear. Dedicated imaging of the temporal lobes demonstrates symmetric hippocampal atrophy. The postcontrast images demonstrate no pathologic enhancement. The skullbase is within normal limits. The midline sagittal images are within normal limits. IMPRESSION: 1. No acute or focal lesion to explain the patient's seizures. 2. Moderate generalized atrophy without significant white matter disease. This is likely related to chronic ischemia. 3. Large left frontal and supraorbital scalp hematoma. No significant intracranial hemorrhage is present. Electronically Signed   By: Marin Robertshristopher  Mattern M.D.   On: 06/01/2015 13:04   Dg Shoulder  Left  06/01/2015  CLINICAL DATA:  Seizure, falling onto hardwood floor with forehead hematoma. Diffuse left shoulder pain. History of dementia. Initial encounter. EXAM: LEFT SHOULDER - 2+ VIEW COMPARISON:  None. FINDINGS: There is no evidence of acute fracture or dislocation. No lytic or blastic osseous lesion or soft tissue abnormality is seen. IMPRESSION: Negative. Electronically Signed   By: Sebastian AcheAllen  Grady M.D.   On: 06/01/2015 08:06     PERTINENT LAB RESULTS: CBC:  Recent Labs  06/01/15 1715 06/02/15 0335  WBC 10.4 8.6  HGB 12.0 11.0*  HCT 37.0 35.1*  PLT 193 217   CMET CMP     Component Value Date/Time   NA 141 06/02/2015 0335   K 3.5 06/02/2015 0335   CL 108 06/02/2015 0335   CO2 26 06/02/2015 0335   GLUCOSE 91 06/02/2015 0335   BUN 7 06/02/2015 0335   CREATININE 0.83 06/02/2015 0335   CALCIUM 8.8* 06/02/2015 0335   PROT 6.2* 06/01/2015 0736   ALBUMIN 3.3* 06/01/2015 0736   AST 32 06/01/2015 0736   ALT 26 06/01/2015 0736   ALKPHOS 65 06/01/2015 0736   BILITOT 0.5 06/01/2015 0736   GFRNONAA >60 06/02/2015 0335   GFRAA >60 06/02/2015 0335    GFR CrCl cannot be calculated (Unknown ideal weight.). No results for input(s): LIPASE, AMYLASE in the last 72 hours. No results for input(s): CKTOTAL, CKMB, CKMBINDEX, TROPONINI in the last 72 hours. Invalid input(s): POCBNP No  results for input(s): DDIMER in the last 72 hours. No results for input(s): HGBA1C in the last 72 hours. No results for input(s): CHOL, HDL, LDLCALC, TRIG, CHOLHDL, LDLDIRECT in the last 72 hours. No results for input(s): TSH, T4TOTAL, T3FREE, THYROIDAB in the last 72 hours.  Invalid input(s): FREET3 No results for input(s): VITAMINB12, FOLATE, FERRITIN, TIBC, IRON, RETICCTPCT in the last 72 hours. Coags: No results for input(s): INR in the last 72 hours.  Invalid input(s): PT Microbiology: Recent Results (from the past 240 hour(s))  Urine culture     Status: None   Collection Time: 06/01/15   8:35 AM  Result Value Ref Range Status   Specimen Description URINE, CATHETERIZED  Final   Special Requests NONE  Final   Culture NO GROWTH 1 DAY  Final   Report Status 06/02/2015 FINAL  Final  Blood culture (routine x 2)     Status: None (Preliminary result)   Collection Time: 06/01/15 10:00 AM  Result Value Ref Range Status   Specimen Description BLOOD LEFT ANTECUBITAL  Final   Special Requests BOTTLES DRAWN AEROBIC AND ANAEROBIC 5CC  Final   Culture NO GROWTH 1 DAY  Final   Report Status PENDING  Incomplete  Blood culture (routine x 2)     Status: None (Preliminary result)   Collection Time: 06/01/15 10:05 AM  Result Value Ref Range Status   Specimen Description BLOOD LEFT HAND  Final   Special Requests BOTTLES DRAWN AEROBIC ONLY 5CC  Final   Culture NO GROWTH 1 DAY  Final   Report Status PENDING  Incomplete     BRIEF HOSPITAL COURSE:   Active Problems: Acute encephalopathy: Secondary to prolonged postictal state and usage of Ativan/morphine. Awake, pleasantly confused and felt back to her usual baseline. MRI brain negative for acute abnormalities, EEG negative for seizure disorder. Spoke with neurology-Dr.Nandigam-who feels that the patient is back to usual baseline-has no further recommendations, and feels that the patient is stable for discharge.   Seizure disorder: Presented with breakthrough seizure, Keppra increased to 750 mg twice a day. No further recommendations from neurology.   Left frontal scalp hematoma: Supportive care. Likely secondary to mechanical fall sustained prior to this hospitalization. Seen by physical therapy-no further recommendations.  Other medical issues were stable during this hospital stay   TODAY-DAY OF DISCHARGE:  Subjective:   Chilton Si today has no headache,no chest abdominal pain,no new weakness tingling or numbness  Objective:   Blood pressure 172/70, pulse 86, temperature 99.7 F (37.6 C), temperature source Axillary, resp. rate  18, SpO2 91 %.  Intake/Output Summary (Last 24 hours) at 06/02/15 1606 Last data filed at 06/02/15 1219  Gross per 24 hour  Intake    120 ml  Output    300 ml  Net   -180 ml   There were no vitals filed for this visit.  Exam Awake Alert, Oriented *3, No new F.N deficits, Normal affect [place].AT,PERRAL Supple Neck,No JVD, No cervical lymphadenopathy appriciated.  Symmetrical Chest wall movement, Good air movement bilaterally, CTAB RRR,No Gallops,Rubs or new Murmurs, No Parasternal Heave +ve B.Sounds, Abd Soft, Non tender, No organomegaly appriciated, No rebound -guarding or rigidity. No Cyanosis, Clubbing or edema, No new Rash or bruise  DISCHARGE CONDITION: Stable  DISPOSITION: ALF   DISCHARGE INSTRUCTIONS:    Activity:  As tolerated with Full fall precautions use walker/cane & assistance as needed  Get Medicines reviewed and adjusted: Please take all your medications with you for your next visit with your  Primary MD  Please request your Primary MD to go over all hospital tests and procedure/radiological results at the follow up, please ask your Primary MD to get all Hospital records sent to his/her office.  If you experience worsening of your admission symptoms, develop shortness of breath, life threatening emergency, suicidal or homicidal thoughts you must seek medical attention immediately by calling 911 or calling your MD immediately  if symptoms less severe.  You must read complete instructions/literature along with all the possible adverse reactions/side effects for all the Medicines you take and that have been prescribed to you. Take any new Medicines after you have completely understood and accpet all the possible adverse reactions/side effects.   Do not drive when taking Pain medications.   Do not take more than prescribed Pain, Sleep and Anxiety Medications  Special Instructions: If you have smoked or chewed Tobacco  in the last 2 yrs please stop smoking, stop any  regular Alcohol  and or any Recreational drug use.  Wear Seat belts while driving.  Please note  You were cared for by a hospitalist during your hospital stay. Once you are discharged, your primary care physician will handle any further medical issues. Please note that NO REFILLS for any discharge medications will be authorized once you are discharged, as it is imperative that you return to your primary care physician (or establish a relationship with a primary care physician if you do not have one) for your aftercare needs so that they can reassess your need for medications and monitor your lab values.   Diet recommendation: Heart Healthy diet  Discharge Instructions    Diet - low sodium heart healthy    Complete by:  As directed      Increase activity slowly    Complete by:  As directed            Follow-up Information    Follow up with [facility]. Schedule an appointment as soon as possible for a visit in 1 week.   Contact information:   [address]     [address] [place] [address] [phone]      Follow up with Ron Parker, MD. Schedule an appointment as soon as possible for a visit in 1 week.   Specialty:  Internal Medicine      Total Time spent on discharge equals 25  minutes.  SignedJeoffrey Massed 06/02/2015 4:06 PM

## 2015-06-02 NOTE — Progress Notes (Signed)
Discharge orders received, Pt for discharge to [facility]. IV d/c'd. . Staff bought pt downstairs via Proofreaderstrecher by PTAR. 06/02/15 1649

## 2015-06-02 NOTE — Progress Notes (Signed)
PT Cancellation Note  Patient Details Name: Alexandria Lowe MRN: [id number] DOB: 08/20/1955   Cancelled Treatment:    Reason Eval/Treat Not Completed: Patient unavailable. NT present in room feeding pt lunch. Will check back as schedule allows to complete PT eval.    Alexandria Lowe, [name] 06/02/2015, 11:55 AM   Alexandria SlipperLaura Neita Landrigan, PT, DPT Acute Rehabilitation Services Pager: [phone]

## 2015-06-03 LAB — LEVETIRACETAM LEVEL: Levetiracetam Lvl: 2 ug/mL — ABNORMAL LOW (ref 10.0–40.0)

## 2015-06-06 LAB — CULTURE, BLOOD (ROUTINE X 2)
CULTURE: NO GROWTH
Culture: NO GROWTH

## 2015-06-16 ENCOUNTER — Emergency Department (HOSPITAL_COMMUNITY)
Admission: EM | Admit: 2015-06-16 | Discharge: 2015-06-16 | Disposition: A | Discharge status: Home / Self Care | Payer: Medicare HMO | Attending: Emergency Medicine | Admitting: Emergency Medicine

## 2015-06-16 DIAGNOSIS — Y92129 Unspecified place in nursing home as the place of occurrence of the external cause: Secondary | ICD-10-CM | POA: Insufficient documentation

## 2015-06-16 DIAGNOSIS — Y9389 Activity, other specified: Secondary | ICD-10-CM | POA: Insufficient documentation

## 2015-06-16 DIAGNOSIS — Z79899 Other long term (current) drug therapy: Secondary | ICD-10-CM | POA: Diagnosis not present

## 2015-06-16 DIAGNOSIS — I10 Essential (primary) hypertension: Secondary | ICD-10-CM | POA: Diagnosis not present

## 2015-06-16 DIAGNOSIS — F329 Major depressive disorder, single episode, unspecified: Secondary | ICD-10-CM | POA: Insufficient documentation

## 2015-06-16 DIAGNOSIS — W07XXXA Fall from chair, initial encounter: Secondary | ICD-10-CM | POA: Diagnosis not present

## 2015-06-16 DIAGNOSIS — Z043 Encounter for examination and observation following other accident: Secondary | ICD-10-CM | POA: Insufficient documentation

## 2015-06-16 DIAGNOSIS — Y998 Other external cause status: Secondary | ICD-10-CM | POA: Diagnosis not present

## 2015-06-16 DIAGNOSIS — Z7951 Long term (current) use of inhaled steroids: Secondary | ICD-10-CM | POA: Insufficient documentation

## 2015-06-16 DIAGNOSIS — G309 Alzheimer's disease, unspecified: Secondary | ICD-10-CM | POA: Diagnosis not present

## 2015-06-16 DIAGNOSIS — W19XXXA Unspecified fall, initial encounter: Secondary | ICD-10-CM

## 2015-06-16 DIAGNOSIS — F0280 Dementia in other diseases classified elsewhere without behavioral disturbance: Secondary | ICD-10-CM | POA: Diagnosis not present

## 2015-06-16 NOTE — Discharge Instructions (Signed)
As discussed, your evaluation today has been largely reassuring.  But, it is important that you monitor your condition carefully, and do not hesitate to return to the ED if you develop new, or concerning changes in your condition.  Otherwise, please follow-up with your physician for appropriate ongoing care.  Fall Prevention in Hospitals, Adult As a hospital patient, your condition and the treatments you receive can increase your risk for falls. Some additional risk factors for falls in a hospital include:  Being in an unfamiliar environment.  Being on bed rest.  Your surgery.  Taking certain medicines.  Your tubing requirements, such as intravenous (IV) therapy or catheters. It is important that you learn how to decrease fall risks while at the hospital. Below are important tips that can help prevent falls. SAFETY TIPS FOR PREVENTING FALLS Talk about your risk of falling.  Ask your health care provider why you are at risk for falling. Is it your medicine, illness, tubing placement, or something else?  Make a plan with your health care provider to keep you safe from falls.  Ask your health care provider or pharmacist about side effects of your medicines. Some medicines can make you dizzy or affect your coordination. Ask for help.  Ask for help before getting out of bed. You may need to press your call button.  Ask for assistance in getting safely to the toilet.  Ask for a walker or cane to be put at your bedside. Ask that most of the side rails on your bed be placed up before your health care provider leaves the room.  Ask family or friends to sit with you.  Ask for things that are out of your reach, such as your glasses, hearing aids, telephone, bedside table, or call button. Follow these tips to avoid falling:  Stay lying or seated, rather than standing, while waiting for help.  Wear rubber-soled slippers or shoes whenever you walk in the hospital.  Avoid quick, sudden  movements.  Change positions slowly.  Sit on the side of your bed before standing.  Stand up slowly and wait before you start to walk.  Let your health care provider know if there is a spill on the floor.  Pay careful attention to the medical equipment, electrical cords, and tubes around you.  When you need help, use your call button by your bed or in the bathroom. Wait for one of your health care providers to help you.  If you feel dizzy or unsure of your footing, return to bed and wait for assistance.  Avoid being distracted by the TV, telephone, or another person in your room.  Do not lean or support yourself on rolling objects, such as IV poles or bedside tables.   This information is not intended to replace advice given to you by your health care provider. Make sure you discuss any questions you have with your health care provider.   Document Released: 05/11/2000 Document Revised: 06/04/2014 Document Reviewed: 01/20/2012 Elsevier Interactive Patient Education [employer]

## 2015-06-16 NOTE — ED Notes (Signed)
Pt attempted to sit in a chair and had a witnessed fall per EMS. Pt has no complaints of pain at this time, no LOC. Pt arrived alert and cooperative.

## 2015-06-16 NOTE — ED Provider Notes (Signed)
CSN: [id number]     Arrival date & time 06/16/15  1409 History   First MD Initiated Contact with Patient 06/16/15 1431     Chief Complaint  Patient presents with  . Fall     (Consider location/radiation/quality/duration/timing/severity/associated sxs/prior Treatment) HPI Patient presents after a witnessed fall. Patient is a nursing home resident, due to history of dementia. Per report, patient was in a chair, fell to the ground. No report of change in interactivity since the fall, no loss of consciousness. Per report, the patient is interacting in a typical manner, and there were no issues in transport. The patient herself answers are in the affirmative to all questions, cannot provide any useful details of her history of present illness. Level V caveat.  Past Medical History  Diagnosis Date  . Hypertension   . Convulsions/seizures (HCC) 06/17/2014  . Depression   . Alzheimer's disease 06/17/2014  . Nonverbal     "for awhile now"/sister (06/01/2015)   Past Surgical History  Procedure Laterality Date  . Vaginal hysterectomy    . Dilation and curettage of uterus     Family History  Problem Relation Age of Onset  . Cancer Mother     lung  . Hypertension Sister   . Thyroid disease Sister   . Hypertension Brother   . Diabetes Brother    Social History  Substance Use Topics  . Smoking status: Never Smoker   . Smokeless tobacco: Never Used  . Alcohol Use: No   OB History    No data available     Review of Systems  Unable to perform ROS: Dementia      Allergies  Review of patient's allergies indicates no known allergies.  Home Medications   Prior to Admission medications   Medication Sig Start Date End Date Taking? Authorizing Provider  acetaminophen (TYLENOL) 500 MG tablet Take 500 mg by mouth every 4 (four) hours as needed for fever.   Yes Historical Provider, MD  hydrocerin (EUCERIN) CREA Apply 1 application topically daily.   Yes Historical Provider, MD    lisinopril-hydrochlorothiazide (PRINZIDE,ZESTORETIC) 20-12.5 MG per tablet Take 1 tablet by mouth daily.   Yes Historical Provider, MD  loperamide (IMODIUM) 2 MG capsule Take 2 mg by mouth as needed for diarrhea or loose stools.   Yes Historical Provider, MD  loratadine (CLARITIN) 10 MG tablet Take 10 mg by mouth daily.   Yes Historical Provider, MD  LORazepam (ATIVAN) 0.5 MG tablet Take 1 tablet (0.5 mg total) by mouth every 8 (eight) hours as needed for anxiety. 06/02/15  Yes Shanker Levora Dredge, MD  memantine (NAMENDA) 10 MG tablet Take 1 tablet (10 mg total) by mouth 2 (two) times daily. 03/06/13  Yes Levert Feinstein, MD  mirtazapine (REMERON) 15 MG tablet Take 15 mg by mouth at bedtime.   Yes Historical Provider, MD  mometasone (NASONEX) 50 MCG/ACT nasal spray Place 2 sprays into the nose daily.   Yes Historical Provider, MD  Multiple Vitamins-Minerals (PRESERVISION AREDS PO) Take 1 tablet by mouth 2 (two) times daily.   Yes Historical Provider, MD  sertraline (ZOLOFT) 50 MG tablet Take 1 tablet (50 mg total) by mouth daily. 03/06/13  Yes Levert Feinstein, MD  simvastatin (ZOCOR) 10 MG tablet Take 10 mg by mouth at bedtime.   Yes Historical Provider, MD  Travoprost, BAK Free, (TRAVATAN) 0.004 % SOLN ophthalmic solution Place 1 drop into both eyes at bedtime.   Yes Historical Provider, MD  Alum & Mag Hydroxide-Simeth (GERI-LANTA PO)  Take 30 mLs by mouth every 6 (six) hours as needed (heartburn/indigestion).    Historical Provider, MD  brimonidine (ALPHAGAN P) 0.1 % SOLN Place 1 drop into both eyes 2 (two) times daily.    Historical Provider, MD  brinzolamide (AZOPT) 1 % ophthalmic suspension Place 1 drop into both eyes 2 (two) times daily.    Historical Provider, MD  Calcium Carbonate-Vitamin D (CALCIUM-D) 600-400 MG-UNIT TABS Take 1 tablet by mouth daily.    Historical Provider, MD  guaiFENesin (ROBITUSSIN) 100 MG/5ML liquid Take 200 mg by mouth every 6 (six) hours as needed for cough.    Historical Provider,  MD  levETIRAcetam (KEPPRA) 750 MG tablet Take 1 tablet (750 mg total) by mouth 2 (two) times daily. 06/02/15   Shanker Levora Dredge, MD  magnesium hydroxide (MILK OF MAGNESIA) 400 MG/5ML suspension Take 30 mLs by mouth at bedtime as needed for mild constipation.    Historical Provider, MD  neomycin-bacitracin-polymyxin (NEOSPORIN) [phone] ointment Apply 1 application topically as needed (until healed).    Historical Provider, MD   BP 156/77 mmHg  Pulse 75  Temp(Src) 98.5 F (36.9 C) (Oral)  Resp 17  SpO2 94% Physical Exam  Constitutional: She appears well-developed and well-nourished. No distress.  HENT:  Head: Normocephalic and atraumatic.    Eyes: Conjunctivae and EOM are normal.  Neck:  Neck is supple, and the patient is moving it in all dimensions spontaneously. She does not spontaneously, with palpation around the midline cervical spine  Cardiovascular: Normal rate and regular rhythm.   Pulmonary/Chest: Effort normal and breath sounds normal. No stridor. No respiratory distress.  Abdominal: She exhibits no distension.  Musculoskeletal: She exhibits no edema.  Neurological: She displays atrophy. She displays no tremor. No cranial nerve deficit. She displays no seizure activity.  Skin: Skin is warm and dry.  Psychiatric: She is withdrawn. Cognition and memory are impaired.  Nursing note and vitals reviewed.   ED Course  Procedures (including critical care time)    MDM   Final diagnoses:  Fall, initial encounter   Patient with dementia presents from her nursing facility after a witnessed fall, no reported loss of consciousness, no change in baseline interactivity. Here the patient is afebrile, hemodynamically stable, moving on from a spontaneously, with soft, supple neck, but with inability to fully participate in the exam. With reassuring physical exam findings, vital signs, and her history, she was returned to her nursing facility.  Gerhard Munch, MD 06/16/15 [phone]

## 2015-06-16 NOTE — Progress Notes (Signed)
[employer]) called for pickup. Attempted to call report, was unsuccessful. Left voice mail message.

## 2015-07-07 ENCOUNTER — Encounter: Payer: Self-pay | Admitting: Adult Health

## 2015-07-07 ENCOUNTER — Ambulatory Visit (INDEPENDENT_AMBULATORY_CARE_PROVIDER_SITE_OTHER): Payer: Medicare HMO | Admitting: Adult Health

## 2015-07-07 VITALS — BP 184/90 | HR 65 | Resp 20 | Ht 64.0 in | Wt 167.0 lb

## 2015-07-07 DIAGNOSIS — R569 Unspecified convulsions: Secondary | ICD-10-CM

## 2015-07-07 DIAGNOSIS — G308 Other Alzheimer's disease: Secondary | ICD-10-CM

## 2015-07-07 DIAGNOSIS — F0280 Dementia in other diseases classified elsewhere without behavioral disturbance: Secondary | ICD-10-CM

## 2015-07-07 NOTE — Patient Instructions (Signed)
Stop Namenda Continue Keppra 750 mg twice a day If your symptoms worsen or you develop new symptoms please let us know.

## 2015-07-07 NOTE — Progress Notes (Signed)
I have read the note, and I agree with the clinical assessment and plan.  Alexandria Lowe,Alexandria Lowe   

## 2015-07-07 NOTE — Progress Notes (Signed)
PATIENT: Alexandria Lowe DOB: 07-29-1955  REASON FOR VISIT: follow up-   Alzheimer's disease , seizures HISTORY FROM: patient  HISTORY OF PRESENT ILLNESS:  [name] is a 60 year old female with a history of  Alzheimer's disease and seizures. She returns today for follow-up. The patient's memory has remained relatively the same. She continues to  Not engage in meaningful conversation. She lives at an extended living facility. She requires assistance with ADLs. Her sister checks on her frequently. The patient has had 2 seizures recently. Both seizures she was taken to the hospital. At the last hospitalization her Keppra was increased to 750 mg twice a day. Since then the patient has not had any additional seizure events. The patient's sister is unable to describe the seizure event she did not witness it. She denies any new neurological symptoms. The patient returns today for an evaluation.  HISTORY 01/04/15: [name] is a 60 year old female with a history of Alzheimer's disease and seizures. She returns today for follow up. The patient is currently taking Aricept 10 mg daily. She is on Keppra 500 mg twice a day for seizures. The patient's caregiver states that she's not had any seizure events. She lives in an extended-care facility. She requires assistance with her ADLs. Since the last visit the caregiver feels that her memory has remained the same. The patient is unable to engage in conversation. The caregiver states that recently she has been going the front of the facility and will pull her pants down and urinate in the floor. Denies any agitation or aggressiveness. She returns today for an evaluation.  HISTORY 06/17/14 (WILLIS): [name] is a 60 year old right-handed black female with a history of Alzheimer's disease that began at least 6 years ago. The patient has been in an extended care facility for 7 or 8 months. She began having seizures in June 2015 when she lives with her son. The patient has had  several recent seizures, one occurred at the nursing facility, resulting in a fall. The patient has had one or 2 other seizures that were witnessed lasting 1-2 minutes. They are associated with generalized, tonic clonic events. The patient is on Aricept taking 10 mg at night. She was seen in the hospital, a CT scan of the brain was done, the images were reviewed on line. This did not show any acute abnormalities. The patient was placed on Keppra, 500 mg twice daily. The patient currently requires some assistance with bathing and dressing. She is able to tell when she needs to use the bathroom, but she has decreased verbal output, much of what is said is nonsense. The patient is ambulatory, and she will have problems with agitation at times. She is sent to this office for evaluation of the seizures.   REVIEW OF SYSTEMS: Out of a complete 14 system review of symptoms, the patient complains only of the following symptoms, and all other reviewed systems are negative.   see history of present illness  ALLERGIES: No Known Allergies  HOME MEDICATIONS: Outpatient Prescriptions Prior to Visit  Medication Sig Dispense Refill  . acetaminophen (TYLENOL) 500 MG tablet Take 500 mg by mouth every 4 (four) hours as needed for fever.    . Alum & Mag Hydroxide-Simeth (GERI-LANTA PO) Take 30 mLs by mouth every 6 (six) hours as needed (heartburn/indigestion).    . brimonidine (ALPHAGAN P) 0.1 % SOLN Place 1 drop into both eyes 2 (two) times daily.    . brinzolamide (AZOPT) 1 % ophthalmic  suspension Place 1 drop into both eyes 2 (two) times daily.    . Calcium Carbonate-Vitamin D (CALCIUM-D) 600-400 MG-UNIT TABS Take 1 tablet by mouth daily.    Marland Kitchen guaiFENesin (ROBITUSSIN) 100 MG/5ML liquid Take 200 mg by mouth every 6 (six) hours as needed for cough.    . hydrocerin (EUCERIN) CREA Apply 1 application topically daily.    Marland Kitchen levETIRAcetam (KEPPRA) 750 MG tablet Take 1 tablet (750 mg total) by mouth 2 (two) times daily.     Marland Kitchen lisinopril-hydrochlorothiazide (PRINZIDE,ZESTORETIC) 20-12.5 MG per tablet Take 1 tablet by mouth daily.    Marland Kitchen loperamide (IMODIUM) 2 MG capsule Take 2 mg by mouth as needed for diarrhea or loose stools.    Marland Kitchen loratadine (CLARITIN) 10 MG tablet Take 10 mg by mouth daily.    Marland Kitchen LORazepam (ATIVAN) 0.5 MG tablet Take 1 tablet (0.5 mg total) by mouth every 8 (eight) hours as needed for anxiety. 30 tablet 0  . magnesium hydroxide (MILK OF MAGNESIA) 400 MG/5ML suspension Take 30 mLs by mouth at bedtime as needed for mild constipation.    . mirtazapine (REMERON) 15 MG tablet Take 15 mg by mouth at bedtime.    . mometasone (NASONEX) 50 MCG/ACT nasal spray Place 2 sprays into the nose daily.    . Multiple Vitamins-Minerals (PRESERVISION AREDS PO) Take 1 tablet by mouth 2 (two) times daily.    Marland Kitchen neomycin-bacitracin-polymyxin (NEOSPORIN) [phone] ointment Apply 1 application topically as needed (until healed).    . sertraline (ZOLOFT) 50 MG tablet Take 1 tablet (50 mg total) by mouth daily. 30 tablet 12  . simvastatin (ZOCOR) 10 MG tablet Take 10 mg by mouth at bedtime.    . Travoprost, BAK Free, (TRAVATAN) 0.004 % SOLN ophthalmic solution Place 1 drop into both eyes at bedtime.    . memantine (NAMENDA) 10 MG tablet Take 1 tablet (10 mg total) by mouth 2 (two) times daily. 60 tablet 12   No facility-administered medications prior to visit.    PAST MEDICAL HISTORY: Past Medical History  Diagnosis Date  . Hypertension   . Convulsions/seizures (HCC) 06/17/2014  . Depression   . Alzheimer's disease 06/17/2014  . Nonverbal     "for awhile now"/sister (06/01/2015)    PAST SURGICAL HISTORY: Past Surgical History  Procedure Laterality Date  . Vaginal hysterectomy    . Dilation and curettage of uterus      FAMILY HISTORY: Family History  Problem Relation Age of Onset  . Cancer Mother     lung  . Hypertension Sister   . Thyroid disease Sister   . Hypertension Brother   . Diabetes Brother      SOCIAL HISTORY: Social History   Social History  . Marital Status: Widowed    Spouse Name: N/A  . Number of Children: 1  . Years of Education: N/A   Occupational History  . Disabled    Social History Main Topics  . Smoking status: Never Smoker   . Smokeless tobacco: Never Used  . Alcohol Use: No  . Drug Use: No  . Sexual Activity: Not on file   Other Topics Concern  . Not on file   Social History Narrative   Patient is right handed   Patient lives at [facility]      PHYSICAL EXAM  Filed Vitals:   07/07/15 1256  BP: 184/90  Pulse: 65  Resp: 20  Height: 5\' 4"  (1.626 m)  Weight: 167 lb (75.751 kg)   Body mass index  is 28.65 kg/(m^2).  Generalized: Well developed, in no acute distress   Neurological examination  Mentation: Alert.  Patient does not engage in meaningful conversation. She follows commands intermittently.  Cranial nerve II-XII: Pupils were equal round reactive to light. Extraocular movements were full, visual field were full on confrontational test. Facial sensation and strength were normal. Uvula tongue midline. Head turning and shoulder shrug  were normal and symmetric. Motor: The motor testing reveals 5 over 5 strength of all 4 extremities. Good symmetric motor tone is noted throughout.  Sensory:  Unable to test Coordination:  Patient cannot complete this.  Gait and station: gait is normal. Reflexes: Deep tendon reflexes are symmetric and normal bilaterally.   DIAGNOSTIC DATA (LABS, IMAGING, TESTING) - I reviewed patient records, labs, notes, testing and imaging myself where available.  Lab Results  Component Value Date   WBC 8.6 06/02/2015   HGB 11.0* 06/02/2015   HCT 35.1* 06/02/2015   MCV 80.9 06/02/2015   PLT 217 06/02/2015      Component Value Date/Time   NA 141 06/02/2015 0335   K 3.5 06/02/2015 0335   CL 108 06/02/2015 0335   CO2 26 06/02/2015 0335   GLUCOSE 91 06/02/2015 0335   BUN 7 06/02/2015 0335   CREATININE  0.83 06/02/2015 0335   CALCIUM 8.8* 06/02/2015 0335   PROT 6.2* 06/01/2015 0736   ALBUMIN 3.3* 06/01/2015 0736   AST 32 06/01/2015 0736   ALT 26 06/01/2015 0736   ALKPHOS 65 06/01/2015 0736   BILITOT 0.5 06/01/2015 0736   GFRNONAA >60 06/02/2015 0335   GFRAA >60 06/02/2015 0335     ASSESSMENT AND PLAN 60 y.o. year old female  has a past medical history of Hypertension; Convulsions/seizures (HCC) (06/17/2014); Depression; Alzheimer's disease (06/17/2014); and Nonverbal. here with:   1. Alzheimer's disease 2. Seizures   At this time I do not feel that Candiss Norse is offering the patient any benefit. She will discontinue this medication. The patient will remain on Keppra 750 mg twice a day. I asked that the sister let us know if she has any additional seizures. She Verbalized understanding. Patient will follow-up in 6 months or sooner if needed.     Butch Penny, MSN, NP-C 07/07/2015, 1:48 PM [facility] [address], [address] [place], [place] [address] [phone]

## 2015-11-04 ENCOUNTER — Emergency Department (HOSPITAL_COMMUNITY): Payer: Medicare HMO

## 2015-11-04 ENCOUNTER — Observation Stay (HOSPITAL_COMMUNITY)
Admission: EM | Admit: 2015-11-04 | Discharge: 2015-11-07 | Disposition: A | Discharge status: Skilled Nursing Facility | Payer: Medicare HMO | Attending: Internal Medicine | Admitting: Internal Medicine

## 2015-11-04 ENCOUNTER — Encounter (HOSPITAL_COMMUNITY): Payer: Self-pay

## 2015-11-04 DIAGNOSIS — W19XXXA Unspecified fall, initial encounter: Secondary | ICD-10-CM | POA: Insufficient documentation

## 2015-11-04 DIAGNOSIS — E876 Hypokalemia: Secondary | ICD-10-CM | POA: Insufficient documentation

## 2015-11-04 DIAGNOSIS — D649 Anemia, unspecified: Secondary | ICD-10-CM

## 2015-11-04 DIAGNOSIS — G309 Alzheimer's disease, unspecified: Secondary | ICD-10-CM | POA: Insufficient documentation

## 2015-11-04 DIAGNOSIS — F329 Major depressive disorder, single episode, unspecified: Secondary | ICD-10-CM | POA: Insufficient documentation

## 2015-11-04 DIAGNOSIS — Z8349 Family history of other endocrine, nutritional and metabolic diseases: Secondary | ICD-10-CM | POA: Diagnosis not present

## 2015-11-04 DIAGNOSIS — F419 Anxiety disorder, unspecified: Secondary | ICD-10-CM | POA: Diagnosis not present

## 2015-11-04 DIAGNOSIS — R569 Unspecified convulsions: Secondary | ICD-10-CM

## 2015-11-04 DIAGNOSIS — Z801 Family history of malignant neoplasm of trachea, bronchus and lung: Secondary | ICD-10-CM | POA: Insufficient documentation

## 2015-11-04 DIAGNOSIS — S42211A Unspecified displaced fracture of surgical neck of right humerus, initial encounter for closed fracture: Secondary | ICD-10-CM | POA: Insufficient documentation

## 2015-11-04 DIAGNOSIS — Z90710 Acquired absence of both cervix and uterus: Secondary | ICD-10-CM | POA: Diagnosis not present

## 2015-11-04 DIAGNOSIS — F0280 Dementia in other diseases classified elsewhere without behavioral disturbance: Secondary | ICD-10-CM | POA: Insufficient documentation

## 2015-11-04 DIAGNOSIS — G40909 Epilepsy, unspecified, not intractable, without status epilepticus: Secondary | ICD-10-CM | POA: Diagnosis not present

## 2015-11-04 DIAGNOSIS — F0390 Unspecified dementia without behavioral disturbance: Secondary | ICD-10-CM | POA: Diagnosis present

## 2015-11-04 DIAGNOSIS — F22 Delusional disorders: Secondary | ICD-10-CM

## 2015-11-04 DIAGNOSIS — X58XXXA Exposure to other specified factors, initial encounter: Secondary | ICD-10-CM | POA: Diagnosis not present

## 2015-11-04 DIAGNOSIS — Z8249 Family history of ischemic heart disease and other diseases of the circulatory system: Secondary | ICD-10-CM | POA: Diagnosis not present

## 2015-11-04 DIAGNOSIS — Z79899 Other long term (current) drug therapy: Secondary | ICD-10-CM | POA: Insufficient documentation

## 2015-11-04 DIAGNOSIS — E785 Hyperlipidemia, unspecified: Secondary | ICD-10-CM | POA: Diagnosis not present

## 2015-11-04 DIAGNOSIS — S42301A Unspecified fracture of shaft of humerus, right arm, initial encounter for closed fracture: Secondary | ICD-10-CM | POA: Diagnosis present

## 2015-11-04 DIAGNOSIS — I10 Essential (primary) hypertension: Secondary | ICD-10-CM | POA: Insufficient documentation

## 2015-11-04 DIAGNOSIS — Z833 Family history of diabetes mellitus: Secondary | ICD-10-CM | POA: Insufficient documentation

## 2015-11-04 DIAGNOSIS — F0391 Unspecified dementia with behavioral disturbance: Secondary | ICD-10-CM | POA: Diagnosis not present

## 2015-11-04 DIAGNOSIS — R4182 Altered mental status, unspecified: Secondary | ICD-10-CM | POA: Insufficient documentation

## 2015-11-04 LAB — URINALYSIS, ROUTINE W REFLEX MICROSCOPIC
BILIRUBIN URINE: NEGATIVE
GLUCOSE, UA: 250 mg/dL — AB
Hgb urine dipstick: NEGATIVE
KETONES UR: NEGATIVE mg/dL
LEUKOCYTES UA: NEGATIVE
NITRITE: NEGATIVE
PH: 6 (ref 5.0–8.0)
PROTEIN: NEGATIVE mg/dL
Specific Gravity, Urine: 1.021 (ref 1.005–1.030)

## 2015-11-04 LAB — CBC WITH DIFFERENTIAL/PLATELET
BASOS PCT: 0 %
Basophils Absolute: 0 10*3/uL (ref 0.0–0.1)
EOS ABS: 0.1 10*3/uL (ref 0.0–0.7)
EOS PCT: 1 %
HCT: 36.2 % (ref 36.0–46.0)
Hemoglobin: 11.2 g/dL — ABNORMAL LOW (ref 12.0–15.0)
Lymphocytes Relative: 21 %
Lymphs Abs: 1.8 10*3/uL (ref 0.7–4.0)
MCH: 24.6 pg — ABNORMAL LOW (ref 26.0–34.0)
MCHC: 30.9 g/dL (ref 30.0–36.0)
MCV: 79.6 fL (ref 78.0–100.0)
MONO ABS: 0.4 10*3/uL (ref 0.1–1.0)
Monocytes Relative: 4 %
Neutro Abs: 6.2 10*3/uL (ref 1.7–7.7)
Neutrophils Relative %: 74 %
PLATELETS: 216 10*3/uL (ref 150–400)
RBC: 4.55 MIL/uL (ref 3.87–5.11)
RDW: 15.7 % — AB (ref 11.5–15.5)
WBC: 8.4 10*3/uL (ref 4.0–10.5)

## 2015-11-04 LAB — BASIC METABOLIC PANEL
Anion gap: 9 (ref 5–15)
BUN: 17 mg/dL (ref 6–20)
CALCIUM: 9.3 mg/dL (ref 8.9–10.3)
CO2: 22 mmol/L (ref 22–32)
Chloride: 111 mmol/L (ref 101–111)
Creatinine, Ser: 1.17 mg/dL — ABNORMAL HIGH (ref 0.44–1.00)
GFR calc Af Amer: 57 mL/min — ABNORMAL LOW (ref >60)
GFR, EST NON AFRICAN AMERICAN: 50 mL/min — AB (ref >60)
GLUCOSE: 180 mg/dL — AB (ref 65–99)
Potassium: 4 mmol/L (ref 3.5–5.1)
SODIUM: 142 mmol/L (ref 135–145)

## 2015-11-04 LAB — MAGNESIUM: MAGNESIUM: 2.1 mg/dL (ref 1.7–2.4)

## 2015-11-04 LAB — CBG MONITORING, ED: Glucose-Capillary: 191 mg/dL — ABNORMAL HIGH (ref 65–99)

## 2015-11-04 MED ORDER — PANTOPRAZOLE SODIUM 40 MG PO TBEC
40.0000 mg | DELAYED_RELEASE_TABLET | Freq: Every day | ORAL | Status: DC
  Administered 2015-11-05 – 2015-11-07 (×3): 40 mg via ORAL
  Filled 2015-11-04 (×3): qty 1

## 2015-11-04 MED ORDER — FLUTICASONE PROPIONATE 50 MCG/ACT NA SUSP
1.0000 | Freq: Every day | NASAL | Status: DC
  Administered 2015-11-05 – 2015-11-07 (×3): 1 via NASAL
  Filled 2015-11-04: qty 16

## 2015-11-04 MED ORDER — SERTRALINE HCL 50 MG PO TABS
75.0000 mg | ORAL_TABLET | Freq: Every day | ORAL | Status: DC
  Administered 2015-11-05 – 2015-11-07 (×3): 75 mg via ORAL
  Filled 2015-11-04 (×3): qty 2

## 2015-11-04 MED ORDER — LEVETIRACETAM 500 MG PO TABS
1000.0000 mg | ORAL_TABLET | Freq: Two times a day (BID) | ORAL | Status: DC
  Administered 2015-11-05 – 2015-11-07 (×5): 1000 mg via ORAL
  Filled 2015-11-04 (×5): qty 2

## 2015-11-04 MED ORDER — LOPERAMIDE HCL 2 MG PO CAPS: 2.0000 mg | ORAL_CAPSULE | ORAL | Status: DC | PRN

## 2015-11-04 MED ORDER — HYDROCHLOROTHIAZIDE 12.5 MG PO CAPS: 12.5000 mg | ORAL_CAPSULE | Freq: Every day | ORAL | Status: DC

## 2015-11-04 MED ORDER — BACITRACIN-NEOMYCIN-POLYMYXIN OINTMENT TUBE
1.0000 "application " | TOPICAL_OINTMENT | CUTANEOUS | Status: DC | PRN
  Filled 2015-11-04: qty 15

## 2015-11-04 MED ORDER — LORAZEPAM 1 MG PO TABS: 0.5000 mg | ORAL_TABLET | Freq: Three times a day (TID) | ORAL | Status: DC | PRN

## 2015-11-04 MED ORDER — SIMVASTATIN 10 MG PO TABS: 10.0000 mg | ORAL_TABLET | Freq: Every day | ORAL | Status: DC

## 2015-11-04 MED ORDER — LORATADINE 10 MG PO TABS
10.0000 mg | ORAL_TABLET | Freq: Every day | ORAL | Status: DC
  Administered 2015-11-05 – 2015-11-07 (×3): 10 mg via ORAL
  Filled 2015-11-04 (×3): qty 1

## 2015-11-04 MED ORDER — SODIUM CHLORIDE 0.9 % IV SOLN
INTRAVENOUS | Status: AC
  Administered 2015-11-04: 22:00:00 via INTRAVENOUS

## 2015-11-04 MED ORDER — LISINOPRIL 20 MG PO TABS
20.0000 mg | ORAL_TABLET | Freq: Every day | ORAL | Status: DC
  Administered 2015-11-05 – 2015-11-07 (×3): 20 mg via ORAL
  Filled 2015-11-04 (×3): qty 1

## 2015-11-04 MED ORDER — LATANOPROST 0.005 % OP SOLN
1.0000 [drp] | Freq: Every day | OPHTHALMIC | Status: DC
  Administered 2015-11-05 – 2015-11-06 (×3): 1 [drp] via OPHTHALMIC
  Filled 2015-11-04: qty 2.5

## 2015-11-04 MED ORDER — BRINZOLAMIDE 1 % OP SUSP
1.0000 [drp] | Freq: Two times a day (BID) | OPHTHALMIC | Status: DC
  Administered 2015-11-04 – 2015-11-07 (×6): 1 [drp] via OPHTHALMIC
  Filled 2015-11-04 (×2): qty 10

## 2015-11-04 MED ORDER — ACETAMINOPHEN 650 MG RE SUPP: 650.0000 mg | Freq: Four times a day (QID) | RECTAL | Status: DC | PRN

## 2015-11-04 MED ORDER — HYDROCERIN EX CREA
1.0000 "application " | TOPICAL_CREAM | Freq: Every day | CUTANEOUS | Status: DC
  Administered 2015-11-05 – 2015-11-07 (×3): 1 via TOPICAL
  Filled 2015-11-04: qty 113

## 2015-11-04 MED ORDER — SODIUM CHLORIDE 0.9 % IV BOLUS (SEPSIS): 1000.0000 mL | Freq: Once | INTRAVENOUS | Status: DC

## 2015-11-04 MED ORDER — ACETAMINOPHEN 500 MG PO TABS: 500.0000 mg | ORAL_TABLET | ORAL | Status: DC | PRN

## 2015-11-04 MED ORDER — LEVETIRACETAM 500 MG PO TABS: 1000.0000 mg | ORAL_TABLET | Freq: Two times a day (BID) | ORAL | Status: DC

## 2015-11-04 MED ORDER — OCUVITE-LUTEIN PO CAPS
1.0000 | ORAL_CAPSULE | Freq: Two times a day (BID) | ORAL | Status: DC
  Filled 2015-11-04: qty 1

## 2015-11-04 MED ORDER — LORAZEPAM 2 MG/ML IJ SOLN: 1.0000 mg | INTRAMUSCULAR | Status: DC | PRN

## 2015-11-04 MED ORDER — MORPHINE SULFATE (PF) 2 MG/ML IV SOLN
1.0000 mg | INTRAVENOUS | Status: DC | PRN
  Administered 2015-11-05: 1 mg via INTRAVENOUS
  Filled 2015-11-04: qty 1

## 2015-11-04 MED ORDER — ENOXAPARIN SODIUM 40 MG/0.4ML ~~LOC~~ SOLN
40.0000 mg | SUBCUTANEOUS | Status: DC
  Administered 2015-11-04 – 2015-11-06 (×3): 40 mg via SUBCUTANEOUS
  Filled 2015-11-04 (×3): qty 0.4

## 2015-11-04 MED ORDER — CALCIUM CARBONATE-VITAMIN D 500-200 MG-UNIT PO TABS
1.0000 | ORAL_TABLET | Freq: Every day | ORAL | Status: DC
  Administered 2015-11-05 – 2015-11-07 (×3): 1 via ORAL
  Filled 2015-11-04 (×3): qty 1

## 2015-11-04 MED ORDER — PROSIGHT PO TABS
1.0000 | ORAL_TABLET | Freq: Two times a day (BID) | ORAL | Status: DC
  Administered 2015-11-05 – 2015-11-07 (×5): 1 via ORAL
  Filled 2015-11-04 (×5): qty 1

## 2015-11-04 MED ORDER — ONDANSETRON HCL 4 MG PO TABS: 4.0000 mg | ORAL_TABLET | Freq: Four times a day (QID) | ORAL | Status: DC | PRN

## 2015-11-04 MED ORDER — ACETAMINOPHEN 325 MG PO TABS: 650.0000 mg | ORAL_TABLET | Freq: Four times a day (QID) | ORAL | Status: DC | PRN

## 2015-11-04 MED ORDER — SODIUM CHLORIDE 0.9% FLUSH
3.0000 mL | Freq: Two times a day (BID) | INTRAVENOUS | Status: DC
  Administered 2015-11-04: 3 mL via INTRAVENOUS

## 2015-11-04 MED ORDER — SIMVASTATIN 10 MG PO TABS
10.0000 mg | ORAL_TABLET | Freq: Every day | ORAL | Status: DC
  Administered 2015-11-05 – 2015-11-06 (×2): 10 mg via ORAL
  Filled 2015-11-04 (×2): qty 1

## 2015-11-04 MED ORDER — SODIUM CHLORIDE 0.9 % IV SOLN
1000.0000 mg | Freq: Once | INTRAVENOUS | Status: AC
  Administered 2015-11-04: 1000 mg via INTRAVENOUS
  Filled 2015-11-04: qty 10

## 2015-11-04 MED ORDER — SODIUM CHLORIDE 0.9 % IV SOLN
75.0000 mL/h | INTRAVENOUS | Status: DC
  Administered 2015-11-04: 75 mL/h via INTRAVENOUS

## 2015-11-04 MED ORDER — MIRTAZAPINE 15 MG PO TABS
30.0000 mg | ORAL_TABLET | Freq: Every day | ORAL | Status: DC
  Administered 2015-11-05 – 2015-11-06 (×2): 30 mg via ORAL
  Filled 2015-11-04 (×2): qty 2

## 2015-11-04 MED ORDER — BRIMONIDINE TARTRATE 0.15 % OP SOLN
1.0000 [drp] | Freq: Two times a day (BID) | OPHTHALMIC | Status: DC
  Administered 2015-11-04 – 2015-11-07 (×6): 1 [drp] via OPHTHALMIC
  Filled 2015-11-04 (×2): qty 5

## 2015-11-04 MED ORDER — LISINOPRIL-HYDROCHLOROTHIAZIDE 20-12.5 MG PO TABS: 1.0000 | ORAL_TABLET | Freq: Every day | ORAL | Status: DC

## 2015-11-04 MED ORDER — LEVETIRACETAM 500 MG/5ML IV SOLN
1000.0000 mg | Freq: Once | INTRAVENOUS | Status: AC
  Administered 2015-11-05: 1000 mg via INTRAVENOUS
  Filled 2015-11-04: qty 10

## 2015-11-04 MED ORDER — ONDANSETRON HCL 4 MG/2ML IJ SOLN: 4.0000 mg | Freq: Four times a day (QID) | INTRAMUSCULAR | Status: DC | PRN

## 2015-11-04 MED ORDER — LORAZEPAM 2 MG/ML IJ SOLN
INTRAMUSCULAR | Status: AC
  Administered 2015-11-04: 2 mg
  Filled 2015-11-04: qty 1

## 2015-11-04 NOTE — ED Notes (Signed)
X-ray called notified [employer] to be portable.

## 2015-11-04 NOTE — ED Provider Notes (Addendum)
CSN: [id number]     Arrival date & time 11/04/15  1410 History   First MD Initiated Contact with Patient 11/04/15 1426     Chief Complaint  Patient presents with  . Seizures     (Consider location/radiation/quality/duration/timing/severity/associated sxs/prior Treatment) HPI 60 year old female who presents with seizure. She has a history of Alzheimer's dementia and seizure disorder. At baseline is almost nonverbal and does not engage in meaningful conversation. Takes Keppra 750 mg twice a day, and is currently in an nursing facility. History is taken from the patient's sister who states that she has otherwise been in her usual state of healt in today while ambulating back to her room from lunch suddenly stiffened up and needed to be assisted to the ground. Stiffening of her body lasted for about 4 minutes, and then she became too and relax. Says that she was very sleepy and confused, slowly returning to baseline. She also had tongue biting. Usually is incontinent of urine. Seizure medication was recently up trait treated from 500 mg twice a day, in January 2017 when she had 2 breakthrough seizures. She has not had cough, shortness of breath, complaints of pain, nausea or vomiting, diarrhea, or new urinary complaints.    Past Medical History  Diagnosis Date  . Hypertension   . Convulsions/seizures (HCC) 06/17/2014  . Depression   . Alzheimer's disease 06/17/2014  . Nonverbal     "for awhile now"/sister (06/01/2015)   Past Surgical History  Procedure Laterality Date  . Vaginal hysterectomy    . Dilation and curettage of uterus     Family History  Problem Relation Age of Onset  . Cancer Mother     lung  . Hypertension Sister   . Thyroid disease Sister   . Hypertension Brother   . Diabetes Brother    Social History  Substance Use Topics  . Smoking status: Never Smoker   . Smokeless tobacco: Never Used  . Alcohol Use: No   OB History    No data available     Review of  Systems 10/14 systems reviewed and are negative other than those stated in the HPI    Allergies  Review of patient's allergies indicates no known allergies.  Home Medications   Prior to Admission medications   Medication Sig Start Date End Date Taking? Authorizing Provider  acetaminophen (TYLENOL) 500 MG tablet Take 500 mg by mouth every 4 (four) hours as needed for fever.   Yes Historical Provider, MD  Alum & Mag Hydroxide-Simeth (GERI-LANTA PO) Take 30 mLs by mouth every 6 (six) hours as needed (heartburn/indigestion).   Yes Historical Provider, MD  brimonidine (ALPHAGAN P) 0.1 % SOLN Place 1 drop into both eyes 2 (two) times daily.   Yes Historical Provider, MD  brinzolamide (AZOPT) 1 % ophthalmic suspension Place 1 drop into both eyes 2 (two) times daily.   Yes Historical Provider, MD  Calcium Carbonate-Vitamin D (CALCIUM-D) 600-400 MG-UNIT TABS Take 1 tablet by mouth daily.   Yes Historical Provider, MD  guaiFENesin (ROBITUSSIN) 100 MG/5ML liquid Take 200 mg by mouth every 6 (six) hours as needed for cough.   Yes Historical Provider, MD  hydrocerin (EUCERIN) CREA Apply 1 application topically daily.   Yes Historical Provider, MD  levETIRAcetam (KEPPRA) 750 MG tablet Take 1 tablet (750 mg total) by mouth 2 (two) times daily. 06/02/15  Yes Shanker Levora Dredge, MD  lisinopril-hydrochlorothiazide (PRINZIDE,ZESTORETIC) 20-12.5 MG per tablet Take 1 tablet by mouth daily.   Yes Historical Provider, MD  loratadine (CLARITIN) 10 MG tablet Take 10 mg by mouth daily.   Yes Historical Provider, MD  LORazepam (ATIVAN) 0.5 MG tablet Take 1 tablet (0.5 mg total) by mouth every 8 (eight) hours as needed for anxiety. 06/02/15  Yes Shanker Levora Dredge, MD  magnesium hydroxide (MILK OF MAGNESIA) 400 MG/5ML suspension Take 30 mLs by mouth at bedtime as needed for mild constipation.   Yes Historical Provider, MD  mirtazapine (REMERON) 30 MG tablet Take 30 mg by mouth at bedtime.   Yes Historical Provider, MD   mometasone (NASONEX) 50 MCG/ACT nasal spray Place 2 sprays into the nose daily.   Yes Historical Provider, MD  Multiple Vitamins-Minerals (PRESERVISION AREDS PO) Take 1 tablet by mouth 2 (two) times daily.   Yes Historical Provider, MD  neomycin-bacitracin-polymyxin (NEOSPORIN) [phone] ointment Apply 1 application topically as needed (until healed).   Yes Historical Provider, MD  sertraline (ZOLOFT) 50 MG tablet Take 1 tablet (50 mg total) by mouth daily. Patient taking differently: Take 75 mg by mouth daily.  03/06/13  Yes Levert Feinstein, MD  simvastatin (ZOCOR) 10 MG tablet Take 10 mg by mouth at bedtime.   Yes Historical Provider, MD  Travoprost, BAK Free, (TRAVATAN) 0.004 % SOLN ophthalmic solution Place 1 drop into both eyes at bedtime.   Yes Historical Provider, MD  loperamide (IMODIUM) 2 MG capsule Take 2 mg by mouth as needed for diarrhea or loose stools.    Historical Provider, MD   BP 137/64 mmHg  Pulse 102  Temp(Src) 98 F (36.7 C)  Resp 20  Ht  (1.727 m)  Wt 165 lb (74.844 kg)  BMI 25.09 kg/m2  SpO2 100% Physical Exam Physical Exam  Nursing note and vitals reviewed. Constitutional: Non-toxic, and in no acute distress Head: Normocephalic and atraumatic.  Mouth/Throat: Oropharynx is clear and moist.  Neck: Normal range of motion. Neck supple.  Cardiovascular: Normal rate and regular rhythm.   Pulmonary/Chest: Effort normal and breath sounds normal.  Abdominal: Soft. There is no tenderness. There is no rebound and no guarding.  Musculoskeletal: No deformity.  Neurological: Alert, non-verbal near baseline,  no facial droop, moves extremities to command Skin: Skin is warm and dry.  Psychiatric: Cooperative  ED Course  .Splint Application Date/Time: 11/04/2015 5:40 PM Performed by: Crista Curb DUO Authorized by: Crista Curb DUO Consent: Verbal consent obtained. Risks and benefits: risks, benefits and alternatives were discussed Consent given by: patient Patient identity  confirmed: arm band Time out: Immediately prior to procedure a "time out" was called to verify the correct patient, procedure, equipment, support staff and site/side marked as required. Location details: right arm Splint type: Sling immbolization for right humerus fracture. Patient tolerance: Patient tolerated the procedure well with no immediate complications   (including critical care time) Labs Review Labs Reviewed  CBC WITH DIFFERENTIAL/PLATELET - Abnormal; Notable for the following:    Hemoglobin 11.2 (*)    MCH 24.6 (*)    RDW 15.7 (*)    All other components within normal limits  BASIC METABOLIC PANEL - Abnormal; Notable for the following:    Glucose, Bld 180 (*)    Creatinine, Ser 1.17 (*)    GFR calc non Af Amer 50 (*)    GFR calc Af Amer 57 (*)    All other components within normal limits  URINALYSIS, ROUTINE W REFLEX MICROSCOPIC (NOT AT [facility]) - Abnormal; Notable for the following:    Glucose, UA 250 (*)    All other components within normal  limits  CBG MONITORING, ED - Abnormal; Notable for the following:    Glucose-Capillary 191 (*)    All other components within normal limits  MAGNESIUM    Imaging Review Dg Chest 2 View  11/04/2015  CLINICAL DATA:  Breakthrough seizure, evaluate for pneumonia ; history of Alzheimer's disease. EXAM: CHEST  2 VIEW COMPARISON:  Chest x-ray of June 01, 2015 FINDINGS: The lungs are well-expanded and clear. The heart and pulmonary vascularity are normal. The mediastinum is normal in width. There is no pleural effusion. There is multilevel degenerative disc disease of the thoracic spine. There is an acute displaced fracture of the neck of the right humerus. IMPRESSION: Acute displaced fracture of the neck of the right humerus. There is no evidence of aspiration nor other acute cardiopulmonary abnormality. This report was called by me to to Dr Verdie MosherLiu in the the [facility] emergency department at 4:37 p.m. on 04 February 2016. Electronically Signed    By: David  SwazilandJordan M.D.   On: 11/04/2015 16:38   Ct Head Wo Contrast  11/04/2015  CLINICAL DATA:  Seizure.  History of seizure Alzheimer's. EXAM: CT HEAD WITHOUT CONTRAST TECHNIQUE: Contiguous axial images were obtained from the base of the skull through the vertex without intravenous contrast. COMPARISON:  June 01, 2015 FINDINGS: The patient was scanned twice due to motion. Paranasal sinuses, mastoid air cells, and middle ears are unremarkable. Extracranial soft tissues are unremarkable. No subdural, epidural, or subarachnoid hemorrhage. Ventricles and sulci are prominent but stable. No mass, mass effect, or midline shift. Scattered white matter changes are identified. No acute cortical ischemia or infarct. Cerebellum, brainstem, and basal cisterns are normal. IMPRESSION: No acute abnormality. Electronically Signed   By: Gerome Samavid  Williams III M.D   On: 11/04/2015 16:17   I have personally reviewed and evaluated these images and lab results as part of my medical decision-making.   EKG Interpretation   Date/Time:  Friday November 04 2015 14:16:06 EDT Ventricular Rate:  75 PR Interval:  140 QRS Duration: 79 QT Interval:  405 QTC Calculation: 452 R Axis:   74 Text Interpretation:  Sinus rhythm Nonspecific T abnormalities, lateral  leads No significant change since last tracing Confirmed by Bostyn Kunkler MD, Remijio Holleran  [phone]) on 11/04/2015 3:00:56 PM      MDM   Final diagnoses:  Seizure (HCC)  Humeral surgical neck fracture, right, closed, initial encounter    60 year old female with history of seizure d/o who presents with breakthrough seizure. Returning to baseline on my evaluation. Hypertensive but remainder of VS non-concerning. Grossly neuro in tact. Work-up with no major electrolyte or metabolic deranagements. CXR with right humerus fracture. CXR w/o PNA or other processes. Pending UA. CT head negative. Discussed with Dr. Amada JupiterKirkpatrick who recommended uptitrating Keppra to 1000 mg BID if w/u otherwise  unremarkable.   INitially returned to baseline mental status in ED but while discussed potential discharge, patient had recurrent seizure. Tonic clonic activity witnessed for about 1-2 minutes, with tongue biting and post ictal period. Given 2 mg ativan and will load with keppra. Will observe, and likely plan for admission pending UA and XR humerus. Signed out to Dr. Imagene GurneyWentz    Aalani Aikens Duo Courtenay Creger, MD 11/04/15 1724  Lavera Guiseana Duo Kearney Evitt, MD 11/04/15 [phone]

## 2015-11-04 NOTE — ED Notes (Signed)
Attempted report x1. 

## 2015-11-04 NOTE — ED Notes (Signed)
Family came to Nursing stating, "she doing it again." Patient noticed to be shaking. MD made aware verbal 2mg  Ativan. Seizure lasted 2 min. EDP was at bedside. Patient postictal at this time. Family still at bedside.

## 2015-11-04 NOTE — ED Notes (Signed)
This tech attempted to In and Out cath pt. Was not successful, nurse was notified.

## 2015-11-04 NOTE — ED Notes (Signed)
Aram BeechamCynthia, pt's sister, [phone]

## 2015-11-04 NOTE — ED Notes (Signed)
2 Mg of ativan given Verbal MD Lu

## 2015-11-04 NOTE — ED Notes (Signed)
Vitals: 198/90 HR109 Spo2 100% RR29. Family at bedside.

## 2015-11-04 NOTE — ED Notes (Signed)
CBG 191 

## 2015-11-04 NOTE — ED Notes (Signed)
Portable at bedside 

## 2015-11-04 NOTE — ED Notes (Signed)
[facility] for a seizure lasting 3-4 minutes, the patient has a history of seizures and dementia and she is at her baseline mentally, patient present to er alert and following commands

## 2015-11-04 NOTE — ED Notes (Signed)
Patient suctioned by courtney RN

## 2015-11-04 NOTE — Progress Notes (Signed)
Patient ID: Alexandria Lowe, female   DOB: 10/08/1955, 60 y.o.   MRN: [id number] I spoke with Dr. Nehemiah MassedWeintz from the ED about [name] and have reviewed her x-rays.  For now, I would treat her in a sling and will re-image her tomorrow once she is up to see how gravity effects the alignment of her right proximal humerus fracture.  Obviously, given her recent seizures, will need to hold off on any urgent surgery until her medical status stabilizes.  I will see her formally tonight or tomorrow am.

## 2015-11-04 NOTE — ED Provider Notes (Signed)
16:50- patient had a generalized seizure, tonic-clonic, with postictal phase. Duration of shaking about 1-1/2 minutes. Patient apparently bit her tongue during the seizure. She was treated with Ativan, IV, and Keppra bolus was ordered. The patient had been contemplated for discharge, after a period of observation.pending evaluations at this time are  Right humerus imaging, and urinalysis. Patient will likely need to be admitted, such as had now a second seizure today, despite being on her usual prescribed medication. Apparently, the decision had been made earlier to increase her to Keppra 1000 mg twice a day.  Medications  sodium chloride 0.9 % bolus 1,000 mL (0 mLs Intravenous Hold 11/04/15 1527)  LORazepam (ATIVAN) 2 MG/ML injection (2 mg  Given 11/04/15 1650)  levETIRAcetam (KEPPRA) 1,000 mg in sodium chloride 0.9 % 100 mL IVPB (0 mg Intravenous Stopped 11/04/15 1737)    Patient Vitals for the past 24 hrs:  BP Temp Pulse Resp SpO2 Height Weight  11/04/15 1915 109/58 mmHg - 77 23 100 % - -  11/04/15 1900 (!) 124/53 mmHg - 78 23 100 % - -  11/04/15 1850 (!) 114/52 mmHg - 73 - 100 % - -  11/04/15 1845 110/56 mmHg - - 18 - - -  11/04/15 1840 (!) 102/48 mmHg - 77 21 100 % - -  11/04/15 1839 - - 80 14 100 % - -  11/04/15 1836 102/80 mmHg - - 20 - - -  11/04/15 1815 (!) 98/47 mmHg - 80 24 100 % - -  11/04/15 1805 (!) 90/48 mmHg - 81 22 100 % - -  11/04/15 1803 (!) 91/47 mmHg - 81 11 100 % - -  11/04/15 1755 (!) 93/53 mmHg - 86 24 100 % - -  11/04/15 1745 108/55 mmHg - 89 - 100 % - -  11/04/15 1740 100/81 mmHg - 86 - 100 % - -  11/04/15 1730 103/57 mmHg - 90 (!) 29 100 % - -  11/04/15 1725 (!) 100/51 mmHg - 88 21 100 % - -  11/04/15 1720 (!) 97/53 mmHg - 87 - 100 % - -  11/04/15 1700 137/64 mmHg - 102 20 100 % - -  11/04/15 1645 198/90 mmHg - 79 14 100 % - -  11/04/15 1643 (!) 202/92 mmHg - 82 17 100 % - -  11/04/15 1642 (!) 202/92 mmHg - 80 22 100 % - -  11/04/15 1545 187/78 mmHg - 93 17 100 % -  -  11/04/15 1530 193/76 mmHg - 92 - 100 % - -  11/04/15 1515 191/81 mmHg - 93 24 100 % - -  11/04/15 1500 192/82 mmHg - 93 21 100 % - -  11/04/15 1457 (!) 201/172 mmHg - 84 20 100 % - -  11/04/15 1430 (!) 184/105 mmHg - 83 20 100 % - -  11/04/15 1416 (!) 161/110 mmHg 98 F (36.7 C) 78 17 100 %  (1.727 m) 165 lb (74.844 kg)     EKG Interpretation  Date/Time:  Friday November 04 2015 14:16:06 EDT Ventricular Rate:  75 PR Interval:  140 QRS Duration: 79 QT Interval:  405 QTC Calculation: 452 R Axis:   74 Text Interpretation:  Sinus rhythm Nonspecific T abnormalities, lateral leads No significant change since last tracing Confirmed by LIU MD, DANA [id number]) on 11/04/2015 3:00:56 PM         19:25- case discussed with orthopedics, Dr. Magnus Ivan, who has seen her x-rays, and recommends sling at this time; which has  already been placed. He will see the patient as a Research scientist (medical)consultant, tomorrow.   7:31 PM Reevaluation with update and discussion. After initial assessment and treatment, an updated evaluation reveals The patient is a little more alert at this time, she opens her eyes to quite verbal speech, but does not follow other commands. According to family members, her baseline is alert, confused, and frequently does not answer questions that are asked of her. Findings discussed with family members and all questions were answered. Alexandria Lowe    Medical decision making- seizure 2 today in a known seizure patient. Patient has been loaded with Keppra and received Ativan. She probably sustained a proximal humerus fracture in the first seizure episode. This may be able to be treated expectantly. She will require admission for further observation, since she has had more than one seizure today. Patient has dementia, at baseline.  7:33 PM-Consult complete with Hospitalist. Patient case explained and discussed. He agrees to admit patient for further evaluation and treatment. Call ended at  [phone]  Mancel BaleElliott Romayne Ticas, MD 11/05/15 [phone]

## 2015-11-04 NOTE — ED Notes (Signed)
Patient still off the floor for a scan. 

## 2015-11-04 NOTE — ED Notes (Addendum)
Pharmacy called for keppra

## 2015-11-04 NOTE — H&P (Signed)
History and Physical    Alexandria Lowe:[id number] DOB: June 22, 1955 DOA: 11/04/2015  Referring MD/NP/PA: Dr. Effie Shy PCP: Ron Parker, MD  Patient coming from: [name] via ems   Chief Complaint: Seizure  HPI: Alexandria Lowe is a 60 y.o. female with medical history significant of HTN, HLD, Alzheimer's disease onset 2011, and seizure disorder; who presents after having a seizure. History is obtained from review of records as patient is mostly nonverbal at baseline. Today following lunch as the patient was walking back to her room had stiffened up and had to be assisted to the ground. Patient apparently remained this way from anywhere from 3-4 minutes. There was reports of tongue biting and urinary incontinence. After the event the patient was noted to be drowsy and confused slowly returning to baseline. Thereafter the patient For her history of seizures she currently takes Keppra 750 mg twice daily which have been titrated up from 500 mg twice a day after she had two breakthrough seizures in 05/2015. Followed by [facility] neurology as an outpatientand was last seen in 07/07/2015 by the nurse practitioner; who at that time discontinued Namenda as it was providing little to no benefit.  Otherwise patient had been doing well prior and family reported no complaints of fever, chills, focal weakness, cough, shortness of breath, chest pain, abdominal pain, nausea, vomiting, diarrhea, or dysuria.   ED Course: Upon admission into the emergency department patient was evaluated, pulse up to 102, respirations up to 29, blood pressure as low as 90/48, and oxygen saturations maintained. Lab work revealed  WBC 8.4, hemoglobin 11.2, BUN 17, creatinine 1.17, glucose 180. Chest x-ray revealed a right humerus neck fracture, but lungs appear to be clear. Urinalysis did not show any signs of infection. Patient was being possibly prepared to be discharged back to nursing facility when she had a second seizure.  She was loaded with  of Keppra and given 1 L of normal saline IV fluids. Dr. Magnus Ivan of orthopedics was consulted and recommended to place the arm in a sling and that they would evaluate patient in a.m. Neurology was called but not officially consulted and Dr. Petra Kuba  recommended to increase Keppra to 1000 mg twice a day.  Review of Systems: Unable to fully obtain secondary to patient being altered and nonverbal.  Past Medical History  Diagnosis Date  . Hypertension   . Convulsions/seizures (HCC) 06/17/2014  . Depression   . Alzheimer's disease 06/17/2014  . Nonverbal     "for awhile now"/sister (06/01/2015)    Past Surgical History  Procedure Laterality Date  . Vaginal hysterectomy    . Dilation and curettage of uterus       reports that she has never smoked. She has never used smokeless tobacco. She reports that she does not drink alcohol or use illicit drugs.  No Known Allergies  Family History  Problem Relation Age of Onset  . Cancer Mother     lung  . Hypertension Sister   . Thyroid disease Sister   . Hypertension Brother   . Diabetes Brother     Prior to Admission medications   Medication Sig Start Date End Date Taking? Authorizing Provider  acetaminophen (TYLENOL) 500 MG tablet Take 500 mg by mouth every 4 (four) hours as needed for fever.   Yes Historical Provider, MD  Alum & Mag Hydroxide-Simeth (GERI-LANTA PO) Take 30 mLs by mouth every 6 (six) hours as needed (heartburn/indigestion).   Yes Historical Provider, MD  brimonidine (ALPHAGAN P)  0.1 % SOLN Place 1 drop into both eyes 2 (two) times daily.   Yes Historical Provider, MD  brinzolamide (AZOPT) 1 % ophthalmic suspension Place 1 drop into both eyes 2 (two) times daily.   Yes Historical Provider, MD  Calcium Carbonate-Vitamin D (CALCIUM-D) 600-400 MG-UNIT TABS Take 1 tablet by mouth daily.   Yes Historical Provider, MD  guaiFENesin (ROBITUSSIN) 100 MG/5ML liquid Take 200 mg by mouth every 6 (six) hours as  needed for cough.   Yes Historical Provider, MD  hydrocerin (EUCERIN) CREA Apply 1 application topically daily.   Yes Historical Provider, MD  levETIRAcetam (KEPPRA) 750 MG tablet Take 1 tablet (750 mg total) by mouth 2 (two) times daily. 06/02/15  Yes Shanker Levora Dredge, MD  lisinopril-hydrochlorothiazide (PRINZIDE,ZESTORETIC) 20-12.5 MG per tablet Take 1 tablet by mouth daily.   Yes Historical Provider, MD  loratadine (CLARITIN) 10 MG tablet Take 10 mg by mouth daily.   Yes Historical Provider, MD  LORazepam (ATIVAN) 0.5 MG tablet Take 1 tablet (0.5 mg total) by mouth every 8 (eight) hours as needed for anxiety. 06/02/15  Yes Shanker Levora Dredge, MD  magnesium hydroxide (MILK OF MAGNESIA) 400 MG/5ML suspension Take 30 mLs by mouth at bedtime as needed for mild constipation.   Yes Historical Provider, MD  mirtazapine (REMERON) 30 MG tablet Take 30 mg by mouth at bedtime.   Yes Historical Provider, MD  mometasone (NASONEX) 50 MCG/ACT nasal spray Place 2 sprays into the nose daily.   Yes Historical Provider, MD  Multiple Vitamins-Minerals (PRESERVISION AREDS PO) Take 1 tablet by mouth 2 (two) times daily.   Yes Historical Provider, MD  neomycin-bacitracin-polymyxin (NEOSPORIN) [phone] ointment Apply 1 application topically as needed (until healed).   Yes Historical Provider, MD  sertraline (ZOLOFT) 50 MG tablet Take 1 tablet (50 mg total) by mouth daily. Patient taking differently: Take 75 mg by mouth daily.  03/06/13  Yes Levert Feinstein, MD  simvastatin (ZOCOR) 10 MG tablet Take 10 mg by mouth at bedtime.   Yes Historical Provider, MD  Travoprost, BAK Free, (TRAVATAN) 0.004 % SOLN ophthalmic solution Place 1 drop into both eyes at bedtime.   Yes Historical Provider, MD  loperamide (IMODIUM) 2 MG capsule Take 2 mg by mouth as needed for diarrhea or loose stools.    Historical Provider, MD    Physical Exam:  Constitutional: Patient appears older than stated age and is somewhat drowsy, but in no acute  distress at this time. Filed Vitals:   11/04/15 1850 11/04/15 1900 11/04/15 1915 11/04/15 1945  BP: 114/52 124/53 109/58 147/70  Pulse: 73 78 77 87  Temp:      Resp:  23 23 17   Height:      Weight:      SpO2: 100% 100% 100% 100%   Eyes: PERRL, lids and conjunctivae normal ENMT: Mucous membranes are dry. Possible tongue lacerations present. Poor dentition hygiene.  Neck: normal, supple, no masses, no thyromegaly Respiratory: clear to auscultation bilaterally, no wheezing, no crackles. Normal respiratory effort. No accessory muscle use.  Cardiovascular: Regular rate and rhythm, no murmurs / rubs / gallops. No extremity edema. 2+ pedal pulses. No carotid bruits.  Abdomen: no tenderness, no masses palpated. No hepatosplenomegaly. Bowel sounds positive.  Musculoskeletal: no clubbing / cyanosis. Right shoulder is in a sling currently with mild swelling and tenderness to palpation of the proximal humeral head. Skin: no rashes, lesions, ulcers. No induration Neurologic: CN 2-12 grossly intact and able to move all 4 extremities  Psychiatric: Patient is mostly nonverbal with poor memory and appears confused.    Labs on Admission: I have personally reviewed following labs and imaging studies  CBC:  Recent Labs Lab 11/04/15 1445  WBC 8.4  NEUTROABS 6.2  HGB 11.2*  HCT 36.2  MCV 79.6  PLT 216   Basic Metabolic Panel:  Recent Labs Lab 11/04/15 1445  NA 142  K 4.0  CL 111  CO2 22  GLUCOSE 180*  BUN 17  CREATININE 1.17*  CALCIUM 9.3  MG 2.1   GFR: Estimated Creatinine Clearance: 51.6 mL/min (by C-G formula based on Cr of 1.17). Liver Function Tests: No results for input(s): AST, ALT, ALKPHOS, BILITOT, PROT, ALBUMIN in the last 168 hours. No results for input(s): LIPASE, AMYLASE in the last 168 hours. No results for input(s): AMMONIA in the last 168 hours. Coagulation Profile: No results for input(s): INR, PROTIME in the last 168 hours. Cardiac Enzymes: No results for  input(s): CKTOTAL, CKMB, CKMBINDEX, TROPONINI in the last 168 hours. BNP (last 3 results) No results for input(s): PROBNP in the last 8760 hours. HbA1C: No results for input(s): HGBA1C in the last 72 hours. CBG:  Recent Labs Lab 11/04/15 1455  GLUCAP 191*   Lipid Profile: No results for input(s): CHOL, HDL, LDLCALC, TRIG, CHOLHDL, LDLDIRECT in the last 72 hours. Thyroid Function Tests: No results for input(s): TSH, T4TOTAL, FREET4, T3FREE, THYROIDAB in the last 72 hours. Anemia Panel: No results for input(s): VITAMINB12, FOLATE, FERRITIN, TIBC, IRON, RETICCTPCT in the last 72 hours. Urine analysis:    Component Value Date/Time   COLORURINE YELLOW 11/04/2015 1640   APPEARANCEUR CLEAR 11/04/2015 1640   LABSPEC 1.021 11/04/2015 1640   PHURINE 6.0 11/04/2015 1640   GLUCOSEU 250* 11/04/2015 1640   HGBUR NEGATIVE 11/04/2015 1640   BILIRUBINUR NEGATIVE 11/04/2015 1640   KETONESUR NEGATIVE 11/04/2015 1640   PROTEINUR NEGATIVE 11/04/2015 1640   UROBILINOGEN 0.2 04/17/2014 1125   NITRITE NEGATIVE 11/04/2015 1640   LEUKOCYTESUR NEGATIVE 11/04/2015 1640   Sepsis Labs: No results found for this or any previous visit (from the past 240 hour(s)).   Radiological Exams on Admission: Dg Chest 2 View  11/04/2015  CLINICAL DATA:  Breakthrough seizure, evaluate for pneumonia ; history of Alzheimer's disease. EXAM: CHEST  2 VIEW COMPARISON:  Chest x-ray of June 01, 2015 FINDINGS: The lungs are well-expanded and clear. The heart and pulmonary vascularity are normal. The mediastinum is normal in width. There is no pleural effusion. There is multilevel degenerative disc disease of the thoracic spine. There is an acute displaced fracture of the neck of the right humerus. IMPRESSION: Acute displaced fracture of the neck of the right humerus. There is no evidence of aspiration nor other acute cardiopulmonary abnormality. This report was called by me to to Dr Verdie Mosher in the the [facility] emergency department  at 4:37 p.m. on 04 February 2016. Electronically Signed   By: David  [place] M.D.   On: 11/04/2015 16:38   Ct Head Wo Contrast  11/04/2015  CLINICAL DATA:  Seizure.  History of seizure Alzheimer's. EXAM: CT HEAD WITHOUT CONTRAST TECHNIQUE: Contiguous axial images were obtained from the base of the skull through the vertex without intravenous contrast. COMPARISON:  June 01, 2015 FINDINGS: The patient was scanned twice due to motion. Paranasal sinuses, mastoid air cells, and middle ears are unremarkable. Extracranial soft tissues are unremarkable. No subdural, epidural, or subarachnoid hemorrhage. Ventricles and sulci are prominent but stable. No mass, mass effect, or midline shift. Scattered white  matter changes are identified. No acute cortical ischemia or infarct. Cerebellum, brainstem, and basal cisterns are normal. IMPRESSION: No acute abnormality. Electronically Signed   By: Gerome Samavid  Williams III M.D   On: 11/04/2015 16:17   Dg Humerus Right  11/04/2015  CLINICAL DATA:  Pain and deformity. EXAM: RIGHT HUMERUS - 2+ VIEW COMPARISON:  06/01/2015 FINDINGS: Complete fracture at the neck of the humerus displaced anteriorly by the width of the bone. There may be an intra-articular fragment adjacent to the humeral head. IMPRESSION: Complete humeral neck fracture, displaced anteriorly. Electronically Signed   By: Paulina FusiMark  Shogry M.D.   On: 11/04/2015 18:53    EKG: Independently reviewed. Sinus rhythm with nonspecific T-wave abnormality  Assessment/Plan Recurrent seizures with history of seizure disorder: Acute. Patient had 2 breakthrough seizures despite being on 750 mg of Keppra twice a day. CT of the brain showed no acute abnormalities. Patient was loaded with thousand milligrams N the ED. Neurology was curbsided and recommended increasing Keppra dose to 1000 mg twice a day. - Admit to telemetry. - Seizure precautions - Dr. neuro status - Check swallow evaluation and will keep NPO if unable to pass for a  formal swallow eval in a.m. - Increase Keppra 1000 mg BID po/IV depending on swallow evaluation - NS IVF at 75 ml/hr - [facility].  Right humerus fracture: Acute. The fracture likely happened during patient's seizure. X-ray revealed a proximal humeral neck fracture. Orthopedics [name] consulted in the ED and recommended placing arm in a sling. - Follow-up orthopedic recommendations in am  Hyperglycemia: Glucose elevated at 180 admission likely secondary to acute stress with recent recent seizures. - Continue to monitor and check a hemoglobin A1c of continues to be elevated   Anemia: Hemoglobin 11.2 on admission. Patient appears to be near baseline per review of previous records from this  year.  - Continue to monitor   Depression/ anxiety - Continue Zoloft and Remeron  Dementia: Patient previously had been tried on Namenda but this was recently stopped as there is no benefit seen to this medication. - Continue to monitor  Essential hypertension - Continue lisinopril- HCTZ   Hyperlipidemia - Continue simvastatin   DVT prophylaxis:  Lovenox  Code Status: Full Family Communication: None  Disposition Plan: Possible discharge back to the nursing facility within 2-3 days   Consults called: Orthopedics Admisson status: Telemetry observation   Clydie Braunondell A Jonique Kulig MD [facility] Pager [phone]- [phone]  If 7PM-7AM, please contact night-coverage [email or web address] Password [email or web address]  11/04/2015, 8:11 PM

## 2015-11-04 NOTE — ED Notes (Signed)
Patient opening eyes to verbal at this time.

## 2015-11-05 DIAGNOSIS — F0391 Unspecified dementia with behavioral disturbance: Secondary | ICD-10-CM | POA: Diagnosis not present

## 2015-11-05 DIAGNOSIS — I10 Essential (primary) hypertension: Secondary | ICD-10-CM

## 2015-11-05 DIAGNOSIS — F0390 Unspecified dementia without behavioral disturbance: Secondary | ICD-10-CM | POA: Diagnosis not present

## 2015-11-05 DIAGNOSIS — E785 Hyperlipidemia, unspecified: Secondary | ICD-10-CM | POA: Diagnosis present

## 2015-11-05 DIAGNOSIS — S42301A Unspecified fracture of shaft of humerus, right arm, initial encounter for closed fracture: Secondary | ICD-10-CM

## 2015-11-05 DIAGNOSIS — D508 Other iron deficiency anemias: Secondary | ICD-10-CM

## 2015-11-05 DIAGNOSIS — R569 Unspecified convulsions: Secondary | ICD-10-CM | POA: Diagnosis not present

## 2015-11-05 DIAGNOSIS — F22 Delusional disorders: Secondary | ICD-10-CM | POA: Insufficient documentation

## 2015-11-05 DIAGNOSIS — D649 Anemia, unspecified: Secondary | ICD-10-CM | POA: Diagnosis present

## 2015-11-05 DIAGNOSIS — R400 Somnolence: Secondary | ICD-10-CM | POA: Diagnosis not present

## 2015-11-05 HISTORY — DX: Essential (primary) hypertension: I10

## 2015-11-05 HISTORY — DX: Anemia, unspecified: D64.9

## 2015-11-05 HISTORY — DX: Unspecified fracture of shaft of humerus, right arm, initial encounter for closed fracture: S42.301A

## 2015-11-05 LAB — COMPREHENSIVE METABOLIC PANEL
ALBUMIN: 2.9 g/dL — AB (ref 3.5–5.0)
ALK PHOS: 52 U/L (ref 38–126)
ALT: 16 U/L (ref 14–54)
AST: 24 U/L (ref 15–41)
Anion gap: 6 (ref 5–15)
BILIRUBIN TOTAL: 1 mg/dL (ref 0.3–1.2)
BUN: 14 mg/dL (ref 6–20)
CALCIUM: 8.6 mg/dL — AB (ref 8.9–10.3)
CO2: 23 mmol/L (ref 22–32)
Chloride: 113 mmol/L — ABNORMAL HIGH (ref 101–111)
Creatinine, Ser: 1.04 mg/dL — ABNORMAL HIGH (ref 0.44–1.00)
GFR calc Af Amer: >60 mL/min (ref >60)
GFR, EST NON AFRICAN AMERICAN: 57 mL/min — AB (ref >60)
GLUCOSE: 112 mg/dL — AB (ref 65–99)
Potassium: 3.2 mmol/L — ABNORMAL LOW (ref 3.5–5.1)
Sodium: 142 mmol/L (ref 135–145)
TOTAL PROTEIN: 5.3 g/dL — AB (ref 6.5–8.1)

## 2015-11-05 LAB — CBC
HEMATOCRIT: 29.6 % — AB (ref 36.0–46.0)
Hemoglobin: 9.1 g/dL — ABNORMAL LOW (ref 12.0–15.0)
MCH: 24.3 pg — ABNORMAL LOW (ref 26.0–34.0)
MCHC: 30.7 g/dL (ref 30.0–36.0)
MCV: 79.1 fL (ref 78.0–100.0)
PLATELETS: 192 10*3/uL (ref 150–400)
RBC: 3.74 MIL/uL — ABNORMAL LOW (ref 3.87–5.11)
RDW: 15.7 % — AB (ref 11.5–15.5)
WBC: 8.6 10*3/uL (ref 4.0–10.5)

## 2015-11-05 MED ORDER — POTASSIUM CHLORIDE 10 MEQ/100ML IV SOLN
10.0000 meq | INTRAVENOUS | Status: AC
  Administered 2015-11-05 (×4): 10 meq via INTRAVENOUS
  Filled 2015-11-05 (×4): qty 100

## 2015-11-05 MED ORDER — CETYLPYRIDINIUM CHLORIDE 0.05 % MT LIQD
7.0000 mL | Freq: Two times a day (BID) | OROMUCOSAL | Status: DC
  Administered 2015-11-05 – 2015-11-07 (×5): 7 mL via OROMUCOSAL

## 2015-11-05 MED ORDER — POTASSIUM CHLORIDE CRYS ER 20 MEQ PO TBCR: 40.0000 meq | EXTENDED_RELEASE_TABLET | Freq: Once | ORAL | Status: DC

## 2015-11-05 MED ORDER — POTASSIUM CHLORIDE IN NACL 40-0.9 MEQ/L-% IV SOLN
INTRAVENOUS | Status: DC
  Administered 2015-11-05 – 2015-11-07 (×4): 75 mL/h via INTRAVENOUS
  Filled 2015-11-05 (×6): qty 1000

## 2015-11-05 NOTE — Evaluation (Signed)
Clinical/Bedside Swallow Evaluation Patient Details  Name: Arnoldo MoraleBonnie L Lua MRN: [id number] Date of Birth: 12/02/1955  Today's Date: 11/05/2015 Time: SLP Start Time (ACUTE ONLY): 1640 SLP Stop Time (ACUTE ONLY): 1658 SLP Time Calculation (min) (ACUTE ONLY): 18 min  Past Medical History:  Past Medical History  Diagnosis Date  . Hypertension   . Convulsions/seizures (HCC) 06/17/2014  . Depression   . Alzheimer's disease 06/17/2014  . Nonverbal     "for awhile now"/sister (06/01/2015)   Past Surgical History:  Past Surgical History  Procedure Laterality Date  . Vaginal hysterectomy    . Dilation and curettage of uterus     HPI:  60 y.o. female with medical history significant of HTN, HLD, Alzheimer's disease onset 2011, and seizure disorder; who presents after having a seizure. History is obtained from review of records as patient is mostly nonverbal at baseline. Today following lunch as the patient was walking back to her room had stiffened up and had to be assisted to the ground. Patient apparently remained this way from anywhere from 3-4 minutes. There was reports of tongue biting and urinary incontinence. After the event the patient was noted to be drowsy and confused slowly returning to baseline. Thereafter the patient For her history of seizures she currently takes Keppra 750 mg twice daily which have been titrated up from 500 mg twice a day after she had two breakthrough seizures in 05/2015; pt NPO with BSE ordered   Assessment / Plan / Recommendation Clinical Impression   Pt did not exhibit any overt s/s of aspiration during BSE; oropharyngeal swallow appears [facility]; Recommend regular/thin liquid diet with ST to f/u x1 for diet tolerance d/t mild aspiration risk secondary to seizure hx and decreased cognitive status.    Aspiration Risk  Mild aspiration risk    Diet Recommendation   Regular/thin liquids  Medication Administration: Other (Comment) (as tolerated; whole/liquids)    Other   Recommendations Oral Care Recommendations: Oral care BID   Follow up Recommendations  None    Frequency and Duration min 1 x/week  1 week       Prognosis Prognosis for Safe Diet Advancement: Good Barriers to Reach Goals: Cognitive deficits      Swallow Study   General Date of Onset: 11/04/15 HPI: 60 y.o. female with medical history significant of HTN, HLD, Alzheimer's disease onset 2011, and seizure disorder; who presents after having a seizure. History is obtained from review of records as patient is mostly nonverbal at baseline. Today following lunch as the patient was walking back to her room had stiffened up and had to be assisted to the ground. Patient apparently remained this way from anywhere from 3-4 minutes. There was reports of tongue biting and urinary incontinence. After the event the patient was noted to be drowsy and confused slowly returning to baseline. Thereafter the patient For her history of seizures she currently takes Keppra 750 mg twice daily which have been titrated up from 500 mg twice a day after she had two breakthrough seizures in 05/2015 Type of Study: Bedside Swallow Evaluation Previous Swallow Assessment: n/a Diet Prior to this Study: NPO Temperature Spikes Noted: No Respiratory Status: Room air History of Recent Intubation: No Behavior/Cognition: Alert;Cooperative;Confused Oral Cavity Assessment: Within Functional Limits Oral Care Completed by SLP: Recent completion by staff Oral Cavity - Dentition: Adequate natural dentition Vision: Functional for self-feeding Self-Feeding Abilities: Able to feed self;Needs assist Patient Positioning: Upright in bed Baseline Vocal Quality: Normal (limited verbalizations) Volitional Cough: Weak Volitional Swallow:  Able to elicit    Oral/Motor/Sensory Function Overall Oral Motor/Sensory Function: Other (comment) (unable to fully assess d/t decreased mentation)   Ice Chips Ice chips: Within functional  limits Presentation: Spoon   Thin Liquid Thin Liquid: Within functional limits Presentation: Cup;Straw    Nectar Thick Nectar Thick Liquid: Not tested   Honey Thick Honey Thick Liquid: Not tested   Puree Puree: Within functional limits Presentation: Spoon   Solid      Solid: Within functional limits Presentation: Self Fed    Functional Assessment Tool Used: NOMS Functional Limitations: Swallowing Swallow Current Status (Z6109): 0 percent impaired, limited or restricted Swallow Goal Status (U0454): 0 percent impaired, limited or restricted   Fadi Menter,PAT, M.S., CCC-SLP 11/05/2015,5:08 PM

## 2015-11-05 NOTE — Care Management Note (Signed)
Case Management Note  Patient Details  Name: Arnoldo MoraleBonnie L Hubbert MRN: [id number] Date of Birth: 10/29/1955  Subjective/Objective:         Presents with seizure activity, history  of HTN, HLD, Alzheimer's disease onset 2011, and seizure disorder. [facility]   Action/Plan: [facility] neurology to consult thsi am Return to SNF when medically stable. CM to f/u with disposition needs.  Expected Discharge Date:                  Expected Discharge Plan:  Skilled Nursing Facility  In-House Referral:  Clinical Social Work  Discharge planning Services  CM Consult  Post Acute Care Choice:    Choice offered to:     DME Arranged:    DME Agency:     HH Arranged:    HH Agency:     Status of Service:  In process, will continue to follow  Medicare Important Message Given:    Date Medicare IM Given:    Medicare IM give by:    Date Additional Medicare IM Given:    Additional Medicare Important Message give by:     If discussed at Long Length of Stay Meetings, dates discussed:    Additional Comments: CM spoke with sister Aram BeechamCynthia regarding d/c planning. Sister states plan is for pt [facility] @ d/c.  Desma Maximhristopher Buchberger ([address]),Cynthia McCray (Sister) [phone]   Gae GallopCole, [place], [place]RN,BSN,CM [phone] 11/05/2015, 8:15 AM

## 2015-11-05 NOTE — Progress Notes (Signed)
[facility] PROGRESS NOTE  Alexandria MoraleBonnie L Lowe ZOX:[id number] DOB: 09/26/1955 DOA: 11/04/2015   PCP: [name],[name], MD     Assessment/Plan: Principal Problem:   Seizures (HCC) Active Problems:   Dementia   Fracture of right humerus   Anemia   Essential hypertension   Hyperlipidemia   "Walking corpse" syndrome   60 y.o. female with medical history significant of HTN, HLD, Alzheimer's disease onset 2011, and seizure disorder; who presented after having a seizure. History is obtained from review of records as patient is mostly nonverbal at baseline. Today following lunch as the patient was walking back to her room had stiffened up and had to be assisted to the ground. Patient apparently remained this way from anywhere from 3-4 minutes.  Assessment and plan Recurrent seizures with history of seizure disorder: Acute. Patient had 2 breakthrough seizures despite being on 750 mg of Keppra twice a day. CT of the brain showed no acute abnormalities. Patient was loaded with thousand milligrams N the ED. Neurology was curbsided and recommended increasing Keppra dose to 1000 mg twice a Telemetry shows normal sinus rhythm  Continue seizure precautions  - Dr. neuro status - Check swallow evaluation and will keep NPO if unable to pass for a formal swallow eval in a.m. - Increase Keppra 1000 mg BID po/IV depending on swallow evaluation - NS IVF at 75 ml/hr    Right humerus fracture: Acute. The fracture likely happened during patient's seizure. X-ray revealed a proximal humeral neck fracture. Orthopedics [name] consulted in the ED and recommended placing arm in a sling. Given the state of her dementia as well as her recent seizure history, we will try to treat this fracture non-operatively in a sling only. No urgent indication for surgery. Will likely align better and heal with time.  Continue her sling for at least the next 2 weeks with outpatient follow-up   Hyperglycemia: Glucose  elevated at 180 admission likely secondary to acute stress with recent recent seizures. - Continue to monitor and check a hemoglobin A1c of continues to be elevated   Anemia: Hemoglobin 11.2>9.1 dropped since admission. Patient appears to be near baseline per review of previous records from this year.  No active signs of bleeding   Depression/ anxiety - Continue Zoloft and Remeron  Dementia: Patient previously had been tried on Namenda but this was recently stopped as there is no benefit seen to this medication. - Continue to monitor  Essential hypertension - Continue lisinopril- HCTZ   Hyperlipidemia - Continue simvastatin   Hypokalemia-likely secondary to HCTZ, discontinue HCTZ   DVT prophylaxis: Lovenox   Code Status:    full code     Family Communication: Discussed in detail with the patient, all imaging results, lab results explained to the patient   Disposition Plan: Anticipate discharge in 2-3 days    Consultants:  Orthopedics  Procedures:  None  Antibiotics: Anti-infectives    None         HPI/Subjective: Patient nonverbal cannot communicate due to dementia  Objective: Filed Vitals:   11/04/15 2015 11/04/15 2059 11/04/15 2146 11/05/15 0500  BP: 168/82 160/75 159/70 137/65  Pulse: 92 77 80 76  Temp:   97.8 F (36.6 C) 98 F (36.7 C)  Resp: 17 18 18 18   Height:      Weight:   70.716 kg (155 lb 14.4 oz)   SpO2: 100% 95% 100% 100%    Intake/Output Summary (Last 24 hours) at 11/05/15 1206 Last data  filed at 11/05/15 1041  Gross per 24 hour  Intake      0 ml  Output      0 ml  Net      0 ml    Exam:  Examination:  Neck: normal, supple, no masses, no thyromegaly Respiratory: clear to auscultation bilaterally, no wheezing, no crackles. Normal respiratory effort. No accessory muscle use.  Cardiovascular: Regular rate and rhythm, no murmurs / rubs / gallops. No extremity edema. 2+ pedal pulses. No carotid bruits.  Abdomen: no  tenderness, no masses palpated. No hepatosplenomegaly. Bowel sounds positive.  Musculoskeletal: no clubbing / cyanosis. Right shoulder is in a sling currently with mild swelling and tenderness to palpation of the proximal humeral head. Skin: no rashes, lesions, ulcers. No induration Neurologic: Moving all 4 extremities  Psychiatric: Patient is mostly nonverbal with poor memory and appears confused  Data Reviewed: I have personally reviewed following labs and imaging studies  Micro Results No results found for this or any previous visit (from the past 240 hour(s)).  Radiology Reports Dg Chest 2 View  11/04/2015  CLINICAL DATA:  Breakthrough seizure, evaluate for pneumonia ; history of Alzheimer's disease. EXAM: CHEST  2 VIEW COMPARISON:  Chest x-ray of June 01, 2015 FINDINGS: The lungs are well-expanded and clear. The heart and pulmonary vascularity are normal. The mediastinum is normal in width. There is no pleural effusion. There is multilevel degenerative disc disease of the thoracic spine. There is an acute displaced fracture of the neck of the right humerus. IMPRESSION: Acute displaced fracture of the neck of the right humerus. There is no evidence of aspiration nor other acute cardiopulmonary abnormality. This report was called by me to to Dr Verdie Mosher in the the [facility] emergency department at 4:37 p.m. on 04 February 2016. Electronically Signed   By: David  [place] M.D.   On: 11/04/2015 16:38   Ct Head Wo Contrast  11/04/2015  CLINICAL DATA:  Seizure.  History of seizure Alzheimer's. EXAM: CT HEAD WITHOUT CONTRAST TECHNIQUE: Contiguous axial images were obtained from the base of the skull through the vertex without intravenous contrast. COMPARISON:  June 01, 2015 FINDINGS: The patient was scanned twice due to motion. Paranasal sinuses, mastoid air cells, and middle ears are unremarkable. Extracranial soft tissues are unremarkable. No subdural, epidural, or subarachnoid hemorrhage. Ventricles  and sulci are prominent but stable. No mass, mass effect, or midline shift. Scattered white matter changes are identified. No acute cortical ischemia or infarct. Cerebellum, brainstem, and basal cisterns are normal. IMPRESSION: No acute abnormality. Electronically Signed   By: Gerome Sam III M.D   On: 11/04/2015 16:17   Dg Humerus Right  11/04/2015  CLINICAL DATA:  Pain and deformity. EXAM: RIGHT HUMERUS - 2+ VIEW COMPARISON:  06/01/2015 FINDINGS: Complete fracture at the neck of the humerus displaced anteriorly by the width of the bone. There may be an intra-articular fragment adjacent to the humeral head. IMPRESSION: Complete humeral neck fracture, displaced anteriorly. Electronically Signed   By: Paulina Fusi M.D.   On: 11/04/2015 18:53     CBC  Recent Labs Lab 11/04/15 1445 11/05/15 0505  WBC 8.4 8.6  HGB 11.2* 9.1*  HCT 36.2 29.6*  PLT 216 192  MCV 79.6 79.1  MCH 24.6* 24.3*  MCHC 30.9 30.7  RDW 15.7* 15.7*  LYMPHSABS 1.8  --   MONOABS 0.4  --   EOSABS 0.1  --   BASOSABS 0.0  --     Chemistries   Recent Labs  Lab 11/04/15 1445 11/05/15 0505  NA 142 142  K 4.0 3.2*  CL 111 113*  CO2 22 23  GLUCOSE 180* 112*  BUN 17 14  CREATININE 1.17* 1.04*  CALCIUM 9.3 8.6*  MG 2.1  --   AST  --  24  ALT  --  16  ALKPHOS  --  52  BILITOT  --  1.0   ------------------------------------------------------------------------------------------------------------------ estimated creatinine clearance is 58 mL/min (by C-G formula based on Cr of 1.04). ------------------------------------------------------------------------------------------------------------------ No results for input(s): HGBA1C in the last 72 hours. ------------------------------------------------------------------------------------------------------------------ No results for input(s): CHOL, HDL, LDLCALC, TRIG, CHOLHDL, LDLDIRECT in the last 72  hours. ------------------------------------------------------------------------------------------------------------------ No results for input(s): TSH, T4TOTAL, T3FREE, THYROIDAB in the last 72 hours.  Invalid input(s): FREET3 ------------------------------------------------------------------------------------------------------------------ No results for input(s): VITAMINB12, FOLATE, FERRITIN, TIBC, IRON, RETICCTPCT in the last 72 hours.  Coagulation profile No results for input(s): INR, PROTIME in the last 168 hours.  No results for input(s): DDIMER in the last 72 hours.  Cardiac Enzymes No results for input(s): CKMB, TROPONINI, MYOGLOBIN in the last 168 hours.  Invalid input(s): CK ------------------------------------------------------------------------------------------------------------------ Invalid input(s): POCBNP   CBG:  Recent Labs Lab 11/04/15 1455  GLUCAP 191*       Studies: Dg Chest 2 View  11/04/2015  CLINICAL DATA:  Breakthrough seizure, evaluate for pneumonia ; history of Alzheimer's disease. EXAM: CHEST  2 VIEW COMPARISON:  Chest x-ray of June 01, 2015 FINDINGS: The lungs are well-expanded and clear. The heart and pulmonary vascularity are normal. The mediastinum is normal in width. There is no pleural effusion. There is multilevel degenerative disc disease of the thoracic spine. There is an acute displaced fracture of the neck of the right humerus. IMPRESSION: Acute displaced fracture of the neck of the right humerus. There is no evidence of aspiration nor other acute cardiopulmonary abnormality. This report was called by me to to Dr Verdie Mosher in the the [facility] emergency department at 4:37 p.m. on 04 February 2016. Electronically Signed   By: David  [place] M.D.   On: 11/04/2015 16:38   Ct Head Wo Contrast  11/04/2015  CLINICAL DATA:  Seizure.  History of seizure Alzheimer's. EXAM: CT HEAD WITHOUT CONTRAST TECHNIQUE: Contiguous axial images were obtained from the  base of the skull through the vertex without intravenous contrast. COMPARISON:  June 01, 2015 FINDINGS: The patient was scanned twice due to motion. Paranasal sinuses, mastoid air cells, and middle ears are unremarkable. Extracranial soft tissues are unremarkable. No subdural, epidural, or subarachnoid hemorrhage. Ventricles and sulci are prominent but stable. No mass, mass effect, or midline shift. Scattered white matter changes are identified. No acute cortical ischemia or infarct. Cerebellum, brainstem, and basal cisterns are normal. IMPRESSION: No acute abnormality. Electronically Signed   By: Gerome Sam III M.D   On: 11/04/2015 16:17   Dg Humerus Right  11/04/2015  CLINICAL DATA:  Pain and deformity. EXAM: RIGHT HUMERUS - 2+ VIEW COMPARISON:  06/01/2015 FINDINGS: Complete fracture at the neck of the humerus displaced anteriorly by the width of the bone. There may be an intra-articular fragment adjacent to the humeral head. IMPRESSION: Complete humeral neck fracture, displaced anteriorly. Electronically Signed   By: Paulina Fusi M.D.   On: 11/04/2015 18:53      Lab Results  Component Value Date   HGBA1C 5.7* 03/11/2013   Lab Results  Component Value Date   CREATININE 1.04* 11/05/2015       Scheduled Meds: . antiseptic oral rinse  7 mL Mouth  Rinse BID  . brimonidine  1 drop Both Eyes BID  . brinzolamide  1 drop Both Eyes BID  . calcium-vitamin D  1 tablet Oral Daily  . enoxaparin (LOVENOX) injection  40 mg Subcutaneous Q24H  . fluticasone  1 spray Each Nare Daily  . hydrocerin  1 application Topical Daily  . hydrochlorothiazide  12.5 mg Oral Daily  . latanoprost  1 drop Both Eyes QHS  . levETIRAcetam  1,000 mg Oral BID  . lisinopril  20 mg Oral Daily  . loratadine  10 mg Oral Daily  . mirtazapine  30 mg Oral QHS  . multivitamin  1 tablet Oral BID  . pantoprazole  40 mg Oral Daily  . sertraline  75 mg Oral Daily  . simvastatin  10 mg Oral QHS  . sodium chloride  1,000 mL  Intravenous Once  . sodium chloride flush  3 mL Intravenous Q12H   Continuous Infusions: . sodium chloride 75 mL/hr (11/04/15 2225)     LOS: 1 day    Time spent: >30 MINS    [facility]  [facility] Pager [phone]. If 7PM-7AM, please contact night-coverage at [email or web address], password [facility] 11/05/2015, 12:06 PM  LOS: 1 day

## 2015-11-05 NOTE — Care Management Obs Status (Signed)
MEDICARE OBSERVATION STATUS NOTIFICATION   Patient Details  Name: Alexandria Lowe MRN: [id number] Date of Birth: 01/04/1956   Medicare Observation Status Notification Given:  Yes    Epifanio LeschesCole, Dene Nazir Hudson, RN 11/05/2015, 8:09 AM

## 2015-11-05 NOTE — Discharge Instructions (Signed)
No lifting with her right arm. Stay in right arm sling at all times. Ice shoulder as needed for swelling.

## 2015-11-05 NOTE — Consult Note (Signed)
Reason for Consult:  Displaced right proximal humerus fracture Referring Physician: Eulis Foster, MD (EDP)  Alexandria Lowe is an 60 y.o. female.  HPI:   60 yo female with dementia and seizure disorder who fell yesterday after a seizure.  Was transported to the [facility] ED and found to have a right proximal humerus fracture with displacement.  She is admitted due to her seizure disorder and has been appropriately placed in a sling on her right upper extremity.  Past Medical History  Diagnosis Date  . Hypertension   . Convulsions/seizures ([place]) 06/17/2014  . Depression   . Alzheimer's disease 06/17/2014  . Nonverbal     "for awhile now"/sister (06/01/2015)    Past Surgical History  Procedure Laterality Date  . Vaginal hysterectomy    . Dilation and curettage of uterus      Family History  Problem Relation Age of Onset  . Cancer Mother     lung  . Hypertension Sister   . Thyroid disease Sister   . Hypertension Brother   . Diabetes Brother     Social History:  reports that she has never smoked. She has never used smokeless tobacco. She reports that she does not drink alcohol or use illicit drugs.  Allergies: No Known Allergies  Medications: I have reviewed the patient's current medications.  Results for orders placed or performed during the hospital encounter of 11/04/15 (from the past 48 hour(s))  CBC with Differential     Status: Abnormal   Collection Time: 11/04/15  2:45 PM  Result Value Ref Range   WBC 8.4 4.0 - 10.5 K/uL   RBC 4.55 3.87 - 5.11 MIL/uL   Hemoglobin 11.2 (L) 12.0 - 15.0 g/dL   HCT 36.2 36.0 - 46.0 %   MCV 79.6 78.0 - 100.0 fL   MCH 24.6 (L) 26.0 - 34.0 pg   MCHC 30.9 30.0 - 36.0 g/dL   RDW 15.7 (H) 11.5 - 15.5 %   Platelets 216 150 - 400 K/uL   Neutrophils Relative % 74 %   Neutro Abs 6.2 1.7 - 7.7 K/uL   Lymphocytes Relative 21 %   Lymphs Abs 1.8 0.7 - 4.0 K/uL   Monocytes Relative 4 %   Monocytes Absolute 0.4 0.1 - 1.0 K/uL   Eosinophils Relative 1 %   Eosinophils Absolute 0.1 0.0 - 0.7 K/uL   Basophils Relative 0 %   Basophils Absolute 0.0 0.0 - 0.1 K/uL  Basic metabolic panel     Status: Abnormal   Collection Time: 11/04/15  2:45 PM  Result Value Ref Range   Sodium 142 135 - 145 mmol/L   Potassium 4.0 3.5 - 5.1 mmol/L   Chloride 111 101 - 111 mmol/L   CO2 22 22 - 32 mmol/L   Glucose, Bld 180 (H) 65 - 99 mg/dL   BUN 17 6 - 20 mg/dL   Creatinine, Ser 1.17 (H) 0.44 - 1.00 mg/dL   Calcium 9.3 8.9 - 10.3 mg/dL   GFR calc non Af Amer 50 (L) >60 mL/min   GFR calc Af Amer 57 (L) >60 mL/min    Comment: (NOTE) The eGFR has been calculated using the CKD EPI equation. This calculation has not been validated in all clinical situations. eGFR's persistently <60 mL/min signify possible Chronic Kidney Disease.    Anion gap 9 5 - 15  Magnesium     Status: None   Collection Time: 11/04/15  2:45 PM  Result Value Ref Range   Magnesium 2.1  1.7 - 2.4 mg/dL  POC CBG, ED     Status: Abnormal   Collection Time: 11/04/15  2:55 PM  Result Value Ref Range   Glucose-Capillary 191 (H) 65 - 99 mg/dL  Urinalysis, Routine w reflex microscopic (not at [facility])     Status: Abnormal   Collection Time: 11/04/15  4:40 PM  Result Value Ref Range   Color, Urine YELLOW YELLOW   APPearance CLEAR CLEAR   Specific Gravity, Urine 1.021 1.005 - 1.030   pH 6.0 5.0 - 8.0   Glucose, UA 250 (A) NEGATIVE mg/dL   Hgb urine dipstick NEGATIVE NEGATIVE   Bilirubin Urine NEGATIVE NEGATIVE   Ketones, ur NEGATIVE NEGATIVE mg/dL   Protein, ur NEGATIVE NEGATIVE mg/dL   Nitrite NEGATIVE NEGATIVE   Leukocytes, UA NEGATIVE NEGATIVE    Comment: MICROSCOPIC NOT DONE ON URINES WITH NEGATIVE PROTEIN, BLOOD, LEUKOCYTES, NITRITE, OR GLUCOSE <1000 mg/dL.  Comprehensive metabolic panel     Status: Abnormal   Collection Time: 11/05/15  5:05 AM  Result Value Ref Range   Sodium 142 135 - 145 mmol/L   Potassium 3.2 (L) 3.5 - 5.1 mmol/L    Comment: DELTA CHECK NOTED   Chloride 113 (H) 101  - 111 mmol/L   CO2 23 22 - 32 mmol/L   Glucose, Bld 112 (H) 65 - 99 mg/dL   BUN 14 6 - 20 mg/dL   Creatinine, Ser 1.04 (H) 0.44 - 1.00 mg/dL   Calcium 8.6 (L) 8.9 - 10.3 mg/dL   Total Protein 5.3 (L) 6.5 - 8.1 g/dL   Albumin 2.9 (L) 3.5 - 5.0 g/dL   AST 24 15 - 41 U/L   ALT 16 14 - 54 U/L   Alkaline Phosphatase 52 38 - 126 U/L   Total Bilirubin 1.0 0.3 - 1.2 mg/dL   GFR calc non Af Amer 57 (L) >60 mL/min   GFR calc Af Amer >60 >60 mL/min    Comment: (NOTE) The eGFR has been calculated using the CKD EPI equation. This calculation has not been validated in all clinical situations. eGFR's persistently <60 mL/min signify possible Chronic Kidney Disease.    Anion gap 6 5 - 15  CBC     Status: Abnormal   Collection Time: 11/05/15  5:05 AM  Result Value Ref Range   WBC 8.6 4.0 - 10.5 K/uL   RBC 3.74 (L) 3.87 - 5.11 MIL/uL   Hemoglobin 9.1 (L) 12.0 - 15.0 g/dL   HCT 29.6 (L) 36.0 - 46.0 %   MCV 79.1 78.0 - 100.0 fL   MCH 24.3 (L) 26.0 - 34.0 pg   MCHC 30.7 30.0 - 36.0 g/dL   RDW 15.7 (H) 11.5 - 15.5 %   Platelets 192 150 - 400 K/uL    Dg Chest 2 View  11/04/2015  CLINICAL DATA:  Breakthrough seizure, evaluate for pneumonia ; history of Alzheimer's disease. EXAM: CHEST  2 VIEW COMPARISON:  Chest x-ray of June 01, 2015 FINDINGS: The lungs are well-expanded and clear. The heart and pulmonary vascularity are normal. The mediastinum is normal in width. There is no pleural effusion. There is multilevel degenerative disc disease of the thoracic spine. There is an acute displaced fracture of the neck of the right humerus. IMPRESSION: Acute displaced fracture of the neck of the right humerus. There is no evidence of aspiration nor other acute cardiopulmonary abnormality. This report was called by me to to Dr Oleta Mouse in the the [facility] emergency department at 4:37 p.m. on 04 February 2016. Electronically Signed   By: David  Martinique M.D.   On: 11/04/2015 16:38   Ct Head Wo Contrast  11/04/2015   CLINICAL DATA:  Seizure.  History of seizure Alzheimer's. EXAM: CT HEAD WITHOUT CONTRAST TECHNIQUE: Contiguous axial images were obtained from the base of the skull through the vertex without intravenous contrast. COMPARISON:  June 01, 2015 FINDINGS: The patient was scanned twice due to motion. Paranasal sinuses, mastoid air cells, and middle ears are unremarkable. Extracranial soft tissues are unremarkable. No subdural, epidural, or subarachnoid hemorrhage. Ventricles and sulci are prominent but stable. No mass, mass effect, or midline shift. Scattered white matter changes are identified. No acute cortical ischemia or infarct. Cerebellum, brainstem, and basal cisterns are normal. IMPRESSION: No acute abnormality. Electronically Signed   By: Dorise Bullion III M.D   On: 11/04/2015 16:17   Dg Humerus Right  11/04/2015  CLINICAL DATA:  Pain and deformity. EXAM: RIGHT HUMERUS - 2+ VIEW COMPARISON:  06/01/2015 FINDINGS: Complete fracture at the neck of the humerus displaced anteriorly by the width of the bone. There may be an intra-articular fragment adjacent to the humeral head. IMPRESSION: Complete humeral neck fracture, displaced anteriorly. Electronically Signed   By: Nelson Chimes M.D.   On: 11/04/2015 18:53    ROS Blood pressure 137/65, pulse 76, temperature 98 F (36.7 C), resp. rate 18, height 5' 8"  (1.727 m), weight 70.716 kg (155 lb 14.4 oz), SpO2 100 %. Physical Exam  Constitutional: She appears well-developed and well-nourished.  Neck: Normal range of motion.  Cardiovascular: Normal rate.   Respiratory: Effort normal and breath sounds normal.  GI: Soft. Bowel sounds are normal.  Musculoskeletal:       Right shoulder: She exhibits decreased range of motion, tenderness, bony tenderness, swelling, deformity and decreased strength.  Neurological: She is alert.   She is alert, but not oriented at all and does not follow my commands. She does have swelling of her right proximal humerus area,  but there is no soft-tissue problems and her shoulder is well-located. Her right hand is well perfused.   Assessment/Plan: Right displaced proximal humerus fracture in a 60 yo female with profound dementia. 1)  Given the state of her dementia as well as her recent seizure history, we will try to treat this fracture non-operatively in a sling only.  No urgent indication for surgery.  Will likely align better and heal with time. 2)  Continue her sling for at least the next 2 weeks with outpatient follow-up.  Kevin Mario Y 11/05/2015, 9:02 AM

## 2015-11-05 NOTE — Progress Notes (Signed)
Admission documentation not completed, pt confused and responds to all questions with "yes". Family not present at the bedside.

## 2015-11-06 DIAGNOSIS — D508 Other iron deficiency anemias: Secondary | ICD-10-CM | POA: Diagnosis not present

## 2015-11-06 DIAGNOSIS — F0391 Unspecified dementia with behavioral disturbance: Secondary | ICD-10-CM

## 2015-11-06 DIAGNOSIS — I10 Essential (primary) hypertension: Secondary | ICD-10-CM | POA: Diagnosis not present

## 2015-11-06 DIAGNOSIS — R569 Unspecified convulsions: Secondary | ICD-10-CM | POA: Diagnosis not present

## 2015-11-06 LAB — CBC
HCT: 25.7 % — ABNORMAL LOW (ref 36.0–46.0)
Hemoglobin: 8.2 g/dL — ABNORMAL LOW (ref 12.0–15.0)
MCH: 25.5 pg — ABNORMAL LOW (ref 26.0–34.0)
MCHC: 31.9 g/dL (ref 30.0–36.0)
MCV: 79.8 fL (ref 78.0–100.0)
PLATELETS: 178 10*3/uL (ref 150–400)
RBC: 3.22 MIL/uL — ABNORMAL LOW (ref 3.87–5.11)
RDW: 15.8 % — AB (ref 11.5–15.5)
WBC: 7.8 10*3/uL (ref 4.0–10.5)

## 2015-11-06 LAB — RETICULOCYTES
RBC.: 3.1 MIL/uL — ABNORMAL LOW (ref 3.87–5.11)
Retic Count, Absolute: 24.8 10*3/uL (ref 19.0–186.0)
Retic Ct Pct: 0.8 % (ref 0.4–3.1)

## 2015-11-06 LAB — IRON AND TIBC
IRON: 12 ug/dL — AB (ref 28–170)
SATURATION RATIOS: 6 % — AB (ref 10.4–31.8)
TIBC: 189 ug/dL — AB (ref 250–450)
UIBC: 177 ug/dL

## 2015-11-06 LAB — COMPREHENSIVE METABOLIC PANEL
ALBUMIN: 2.8 g/dL — AB (ref 3.5–5.0)
ALT: 15 U/L (ref 14–54)
ANION GAP: 6 (ref 5–15)
AST: 28 U/L (ref 15–41)
Alkaline Phosphatase: 52 U/L (ref 38–126)
BILIRUBIN TOTAL: 0.7 mg/dL (ref 0.3–1.2)
BUN: 6 mg/dL (ref 6–20)
CHLORIDE: 113 mmol/L — AB (ref 101–111)
CO2: 21 mmol/L — AB (ref 22–32)
Calcium: 8.5 mg/dL — ABNORMAL LOW (ref 8.9–10.3)
Creatinine, Ser: 0.85 mg/dL (ref 0.44–1.00)
GFR calc Af Amer: >60 mL/min (ref >60)
GFR calc non Af Amer: >60 mL/min (ref >60)
GLUCOSE: 104 mg/dL — AB (ref 65–99)
POTASSIUM: 3.7 mmol/L (ref 3.5–5.1)
Sodium: 140 mmol/L (ref 135–145)
Total Protein: 5.4 g/dL — ABNORMAL LOW (ref 6.5–8.1)

## 2015-11-06 LAB — FERRITIN: FERRITIN: 62 ng/mL (ref 11–307)

## 2015-11-06 LAB — VITAMIN B12: VITAMIN B 12: 289 pg/mL (ref 180–914)

## 2015-11-06 LAB — FOLATE: Folate: 20.4 ng/mL (ref >5.9)

## 2015-11-06 NOTE — Progress Notes (Signed)
OT Cancellation Note  Patient Details Name: Alexandria Lowe MRN: [id number] DOB: 06/16/1955   Cancelled Treatment:    Reason Eval/Treat Not Completed: OT screened, no needs identified, will sign off. Spoke with PT regarding pts therapy needs. Per PT, pt is currently at baseline. She is from SNF with plans to return to SNF. At this time, no acute OT needs identified; signing off. Please re-consult if needs change. Thank you for this referral.  Gaye AlkenBailey A Vi Biddinger M.S., OTR/L Pager: [phone]  11/06/2015, 2:54 PM

## 2015-11-06 NOTE — Evaluation (Signed)
Physical Therapy Evaluation Patient Details Name: Alexandria Lowe MRN: [id number] DOB: 1955-12-03 Today's Date: 11/06/2015   History of Present Illness    60 y.o. female with medical history significant of HTN, HLD, Alzheimer's disease onset 2011, and seizure disorder; who presented after having a seizure. History is obtained from review of records as patient is mostly nonverbal at baseline. On 6/9 patient was walking back to her room had stiffened up and had to be assisted to the ground. Patient apparently remained this way from anywhere from 3-4 minutes.At baseline is almost nonverbal and does not engage in meaningful conversation.  Discovered R humeral fx/dislocation, placed in sling for conservative treatment   Clinical Impression  Brother at bedside, confirms pt at/near baseline for functional mobility.  Pt cooperative and able to ambulate 50-60 feet with hand held assist.  Assuming this is true baseline, recommend return to facility with no PT follow up, however, if facility discovers pt is more dependent than previously, would recommend trial of PT at facility.    Follow Up Recommendations Supervision/Assistance - 24 hour    Equipment Recommendations  None recommended by PT    Recommendations for Other Services       Precautions / Restrictions Precautions Precautions: Fall (seizure) Precaution Comments: bed alarm; pads on bed rails;  Required Braces or Orthoses: Sling (to RUE)      Mobility  Bed Mobility Overal bed mobility: Needs Assistance Bed Mobility: Supine to Sit;Sit to Supine     Supine to sit: Min assist Sit to supine: Mod assist   General bed mobility comments: mostly cues to initiate/complete tasks with some physical assist to adjust position in bed  Transfers Overall transfer level: Needs assistance Equipment used: 1 person hand held assist Transfers: Sit to/from Stand Sit to Stand: Min assist         General transfer comment: tactile cues, gestures to  convey task and to initiate  Ambulation/Gait Ambulation/Gait assistance: Min guard Ambulation Distance (Feet): 55 Feet Assistive device: 1 person hand held assist Gait Pattern/deviations: Step-through pattern;Decreased stride length   Gait velocity interpretation: Below normal speed for age/gender General Gait Details: slow, needs prompts to navigate, no instability noted  Stairs            Wheelchair Mobility    Modified Rankin (Stroke Patients Only)       Balance Overall balance assessment: No apparent balance deficits (not formally assessed)                                           Pertinent Vitals/Pain Pain Assessment: Faces Faces Pain Scale: No hurt    Home Living Family/patient expects to be discharged to:: Assisted living (or SNF? back to prior facility)               Home Equipment: None (per brother)      Prior Function Level of Independence: Needs assistance   Gait / Transfers Assistance Needed: supervision; per brother she "gets around fine"  ADL's / Homemaking Assistance Needed: dependent        Hand Dominance        Extremity/Trunk Assessment   Upper Extremity Assessment: Overall WFL for tasks assessed;Defer to OT evaluation           Lower Extremity Assessment: Overall WFL for tasks assessed         Communication   Communication: Expressive difficulties  Cognition Arousal/Alertness: Awake/alert Behavior During Therapy: WFL for tasks assessed/performed;Flat affect Overall Cognitive Status: History of cognitive impairments - at baseline (dementia)                      General Comments      Exercises        Assessment/Plan    PT Assessment Patent does not need any further PT services;All further PT needs can be met in the next venue of care  PT Diagnosis     PT Problem List Decreased cognition;Decreased mobility;Decreased knowledge of use of DME;Decreased safety awareness  PT Treatment  Interventions     PT Goals (Current goals can be found in the Care Plan section) Acute Rehab PT Goals PT Goal Formulation: All assessment and education complete, DC therapy    Frequency     Barriers to discharge        Co-evaluation               End of Session   Activity Tolerance: Patient tolerated treatment well Patient left: in bed;with call Boffa/phone within reach;with family/visitor present Nurse Communication: Mobility status    Functional Assessment Tool Used: clinical judgment Functional Limitation: Mobility: Walking and moving around Mobility: Walking and Moving Around Current Status (F8182): At least 60 percent but less than 80 percent impaired, limited or restricted Mobility: Walking and Moving Around Goal Status [id number]): At least 60 percent but less than 80 percent impaired, limited or restricted Mobility: Walking and Moving Around Discharge Status [id number]): At least 60 percent but less than 80 percent impaired, limited or restricted    Time: 1343-1410 PT Time Calculation (min) (ACUTE ONLY): 27 min   Charges:   PT Evaluation $PT Eval Moderate Complexity: 1 Procedure PT Treatments $Therapeutic Activity: 8-22 mins   PT G Codes:   PT G-Codes **NOT FOR INPATIENT CLASS** Functional Assessment Tool Used: clinical judgment Functional Limitation: Mobility: Walking and moving around Mobility: Walking and Moving Around Current Status (L3810): At least 60 percent but less than 80 percent impaired, limited or restricted Mobility: Walking and Moving Around Goal Status [phone]): At least 60 percent but less than 80 percent impaired, limited or restricted Mobility: Walking and Moving Around Discharge Status [phone]): At least 60 percent but less than 80 percent impaired, limited or restricted    Herbie Drape 11/06/2015, 4:14 PM

## 2015-11-06 NOTE — Progress Notes (Addendum)
[facility] PROGRESS NOTE  Arnoldo MoraleBonnie L Youngren ZOX:[id number] DOB: 11/30/1955 DOA: 11/04/2015   PCP: [name],[name], MD     Assessment/Plan: Principal Problem:   Seizures (HCC) Active Problems:   Dementia   Fracture of right humerus   Anemia   Essential hypertension   Hyperlipidemia   "Walking corpse" syndrome   60 y.o. female with medical history significant of HTN, HLD, Alzheimer's disease onset 2011, and seizure disorder; who presented after having a seizure. History is obtained from review of records as patient is mostly nonverbal at baseline. On 6/9 patient was walking back to her room had stiffened up and had to be assisted to the ground. Patient apparently remained this way from anywhere from 3-4 minutes.At baseline is almost nonverbal and does not engage in meaningful conversation  Assessment and plan Recurrent seizures with history of seizure disorder:  . Patient had 2 breakthrough seizures despite being on 750 mg of Keppra twice a day. CT of the brain showed no acute abnormalities. Patient was loaded with thousand milligrams N the ED. Neurology was curbsided and recommended increasing Keppra dose to 1000 mg twice a Telemetry shows normal sinus rhythm  Continue seizure precautions  - stable . neuro status-  PT/OT evaluation to rule out any gross focal deficits, if patient is functionally at baseline and no further imaging studies are needed Speech therapy evaluation recommends regular diet with thin liquids Cont  Keppra 1000 mg BID po/IV depending on swallow evaluation - NS IVF at 75 ml/hr    Right humerus fracture: Acute. The fracture likely happened during patient's seizure. X-ray revealed a proximal humeral neck fracture. Orthopedics [name] consulted in the ED and recommended placing arm in a sling. Given the state of her dementia as well as her recent seizure history, we will try to treat this fracture non-operatively in a sling only. No urgent indication for  surgery. Will likely align better and heal with time.  Continue her sling for at least the next 2 weeks with outpatient follow-up   Hyperglycemia: Glucose elevated at 180 admission likely secondary to acute stress with recent recent seizures. - Continue to monitor and check a hemoglobin A1c of continues to be elevated   Anemia: Hemoglobin 11.2>9.1>8.2  dropped since admission. Baseline hemoglobin around 11.1 No active signs of bleeding Continue PPI   Depression/ anxiety - Continue Zoloft and Remeron  Dementia: Patient previously had been tried on Namenda but this was recently stopped as there is no benefit seen to this medication. - Continue to monitor  Essential hypertension - Continue lisinopril- HCTZ   Hyperlipidemia - Continue simvastatin   Hypokalemia-likely secondary to HCTZ, continue to hold HCTZ   DVT prophylaxis: Lovenox   Code Status:    full code     Family Communication: Discussed in detail with the patient, all imaging results, lab results explained to the patient   Disposition Plan: Anticipate discharge tomorrow if hemoglobin stable    Consultants:  Orthopedics  Procedures:  None  Antibiotics: Anti-infectives    None         HPI/Subjective: Patient nonverbal cannot communicate due to dementia  Objective: Filed Vitals:   11/05/15 0500 11/05/15 1506 11/05/15 2146 11/06/15 0505  BP: 137/65 155/74 124/83 165/73  Pulse: 76 76 74 72  Temp: 98 F (36.7 C) 98.4 F (36.9 C) 98.3 F (36.8 C) 98.6 F (37 C)  TempSrc:    Oral  Resp: 18 17 18 18   Height:  Weight:      SpO2: 100% 100% 100% 100%    Intake/Output Summary (Last 24 hours) at 11/06/15 1011 Last data filed at 11/06/15 0646  Gross per 24 hour  Intake   1120 ml  Output      0 ml  Net   1120 ml    Exam:  Examination:  Neck: normal, supple, no masses, no thyromegaly Respiratory: clear to auscultation bilaterally, no wheezing, no crackles. Normal respiratory  effort. No accessory muscle use.  Cardiovascular: Regular rate and rhythm, no murmurs / rubs / gallops. No extremity edema. 2+ pedal pulses. No carotid bruits.  Abdomen: no tenderness, no masses palpated. No hepatosplenomegaly. Bowel sounds positive.  Musculoskeletal: no clubbing / cyanosis. Right shoulder is in a sling currently with mild swelling and tenderness to palpation of the proximal humeral head. Skin: no rashes, lesions, ulcers. No induration Neurologic: Moving all 4 extremities  Psychiatric: Patient is mostly nonverbal with poor memory and appears confused  Data Reviewed: I have personally reviewed following labs and imaging studies  Micro Results No results found for this or any previous visit (from the past 240 hour(s)).  Radiology Reports Dg Chest 2 View  11/04/2015  CLINICAL DATA:  Breakthrough seizure, evaluate for pneumonia ; history of Alzheimer's disease. EXAM: CHEST  2 VIEW COMPARISON:  Chest x-ray of June 01, 2015 FINDINGS: The lungs are well-expanded and clear. The heart and pulmonary vascularity are normal. The mediastinum is normal in width. There is no pleural effusion. There is multilevel degenerative disc disease of the thoracic spine. There is an acute displaced fracture of the neck of the right humerus. IMPRESSION: Acute displaced fracture of the neck of the right humerus. There is no evidence of aspiration nor other acute cardiopulmonary abnormality. This report was called by me to to Dr Verdie Mosher in the the [facility] emergency department at 4:37 p.m. on 04 February 2016. Electronically Signed   By: David  [place] M.D.   On: 11/04/2015 16:38   Ct Head Wo Contrast  11/04/2015  CLINICAL DATA:  Seizure.  History of seizure Alzheimer's. EXAM: CT HEAD WITHOUT CONTRAST TECHNIQUE: Contiguous axial images were obtained from the base of the skull through the vertex without intravenous contrast. COMPARISON:  June 01, 2015 FINDINGS: The patient was scanned twice due to motion.  Paranasal sinuses, mastoid air cells, and middle ears are unremarkable. Extracranial soft tissues are unremarkable. No subdural, epidural, or subarachnoid hemorrhage. Ventricles and sulci are prominent but stable. No mass, mass effect, or midline shift. Scattered white matter changes are identified. No acute cortical ischemia or infarct. Cerebellum, brainstem, and basal cisterns are normal. IMPRESSION: No acute abnormality. Electronically Signed   By: Gerome Sam III M.D   On: 11/04/2015 16:17   Dg Humerus Right  11/04/2015  CLINICAL DATA:  Pain and deformity. EXAM: RIGHT HUMERUS - 2+ VIEW COMPARISON:  06/01/2015 FINDINGS: Complete fracture at the neck of the humerus displaced anteriorly by the width of the bone. There may be an intra-articular fragment adjacent to the humeral head. IMPRESSION: Complete humeral neck fracture, displaced anteriorly. Electronically Signed   By: Paulina Fusi M.D.   On: 11/04/2015 18:53     CBC  Recent Labs Lab 11/04/15 1445 11/05/15 0505 11/06/15 0526  WBC 8.4 8.6 7.8  HGB 11.2* 9.1* 8.2*  HCT 36.2 29.6* 25.7*  PLT 216 192 178  MCV 79.6 79.1 79.8  MCH 24.6* 24.3* 25.5*  MCHC 30.9 30.7 31.9  RDW 15.7* 15.7* 15.8*  LYMPHSABS 1.8  --   --  MONOABS 0.4  --   --   EOSABS 0.1  --   --   BASOSABS 0.0  --   --     Chemistries   Recent Labs Lab 11/04/15 1445 11/05/15 0505 11/06/15 0526  NA 142 142 140  K 4.0 3.2* 3.7  CL 111 113* 113*  CO2 22 23 21*  GLUCOSE 180* 112* 104*  BUN CREATININE 1.17* 1.04* 0.85  CALCIUM 9.3 8.6* 8.5*  MG 2.1  --   --   AST  --  24 28  ALT  --  16 15  ALKPHOS  --  52 52  BILITOT  --  1.0 0.7   ------------------------------------------------------------------------------------------------------------------ estimated creatinine clearance is 71 mL/min (by C-G formula based on Cr of 0.85). ------------------------------------------------------------------------------------------------------------------ No  results for input(s): HGBA1C in the last 72 hours. ------------------------------------------------------------------------------------------------------------------ No results for input(s): CHOL, HDL, LDLCALC, TRIG, CHOLHDL, LDLDIRECT in the last 72 hours. ------------------------------------------------------------------------------------------------------------------ No results for input(s): TSH, T4TOTAL, T3FREE, THYROIDAB in the last 72 hours.  Invalid input(s): FREET3 ------------------------------------------------------------------------------------------------------------------ No results for input(s): VITAMINB12, FOLATE, FERRITIN, TIBC, IRON, RETICCTPCT in the last 72 hours.  Coagulation profile No results for input(s): INR, PROTIME in the last 168 hours.  No results for input(s): DDIMER in the last 72 hours.  Cardiac Enzymes No results for input(s): CKMB, TROPONINI, MYOGLOBIN in the last 168 hours.  Invalid input(s): CK ------------------------------------------------------------------------------------------------------------------ Invalid input(s): POCBNP   CBG:  Recent Labs Lab 11/04/15 1455  GLUCAP 191*       Studies: Dg Chest 2 View  11/04/2015  CLINICAL DATA:  Breakthrough seizure, evaluate for pneumonia ; history of Alzheimer's disease. EXAM: CHEST  2 VIEW COMPARISON:  Chest x-ray of June 01, 2015 FINDINGS: The lungs are well-expanded and clear. The heart and pulmonary vascularity are normal. The mediastinum is normal in width. There is no pleural effusion. There is multilevel degenerative disc disease of the thoracic spine. There is an acute displaced fracture of the neck of the right humerus. IMPRESSION: Acute displaced fracture of the neck of the right humerus. There is no evidence of aspiration nor other acute cardiopulmonary abnormality. This report was called by me to to Dr Verdie Mosher in the the [facility] emergency department at 4:37 p.m. on 04 February 2016.  Electronically Signed   By: David  [place] M.D.   On: 11/04/2015 16:38   Ct Head Wo Contrast  11/04/2015  CLINICAL DATA:  Seizure.  History of seizure Alzheimer's. EXAM: CT HEAD WITHOUT CONTRAST TECHNIQUE: Contiguous axial images were obtained from the base of the skull through the vertex without intravenous contrast. COMPARISON:  June 01, 2015 FINDINGS: The patient was scanned twice due to motion. Paranasal sinuses, mastoid air cells, and middle ears are unremarkable. Extracranial soft tissues are unremarkable. No subdural, epidural, or subarachnoid hemorrhage. Ventricles and sulci are prominent but stable. No mass, mass effect, or midline shift. Scattered white matter changes are identified. No acute cortical ischemia or infarct. Cerebellum, brainstem, and basal cisterns are normal. IMPRESSION: No acute abnormality. Electronically Signed   By: Gerome Sam III M.D   On: 11/04/2015 16:17   Dg Humerus Right  11/04/2015  CLINICAL DATA:  Pain and deformity. EXAM: RIGHT HUMERUS - 2+ VIEW COMPARISON:  06/01/2015 FINDINGS: Complete fracture at the neck of the humerus displaced anteriorly by the width of the bone. There may be an intra-articular fragment adjacent to the humeral head. IMPRESSION: Complete humeral neck fracture, displaced anteriorly. Electronically Signed   By: Paulina Fusi  M.D.   On: 11/04/2015 18:53      Lab Results  Component Value Date   HGBA1C 5.7* 03/11/2013   Lab Results  Component Value Date   CREATININE 0.85 11/06/2015       Scheduled Meds: . antiseptic oral rinse  7 mL Mouth Rinse BID  . brimonidine  1 drop Both Eyes BID  . brinzolamide  1 drop Both Eyes BID  . calcium-vitamin D  1 tablet Oral Daily  . enoxaparin (LOVENOX) injection  40 mg Subcutaneous Q24H  . fluticasone  1 spray Each Nare Daily  . hydrocerin  1 application Topical Daily  . latanoprost  1 drop Both Eyes QHS  . levETIRAcetam  1,000 mg Oral BID  . lisinopril  20 mg Oral Daily  . loratadine  10 mg  Oral Daily  . mirtazapine  30 mg Oral QHS  . multivitamin  1 tablet Oral BID  . pantoprazole  40 mg Oral Daily  . sertraline  75 mg Oral Daily  . simvastatin  10 mg Oral QHS  . sodium chloride  1,000 mL Intravenous Once  . sodium chloride flush  3 mL Intravenous Q12H   Continuous Infusions: . 0.9 % NaCl with KCl 40 mEq / L 75 mL/hr (11/06/15 0519)     LOS: 2 days    Time spent: >30 MINS    [facility]  [facility] Pager [phone]. If 7PM-7AM, please contact night-coverage at [email or web address], password [facility] 11/06/2015, 10:11 AM  LOS: 2 days

## 2015-11-07 DIAGNOSIS — R4182 Altered mental status, unspecified: Secondary | ICD-10-CM | POA: Insufficient documentation

## 2015-11-07 DIAGNOSIS — R400 Somnolence: Secondary | ICD-10-CM

## 2015-11-07 DIAGNOSIS — S42301A Unspecified fracture of shaft of humerus, right arm, initial encounter for closed fracture: Secondary | ICD-10-CM | POA: Diagnosis not present

## 2015-11-07 DIAGNOSIS — R569 Unspecified convulsions: Secondary | ICD-10-CM | POA: Diagnosis not present

## 2015-11-07 DIAGNOSIS — I10 Essential (primary) hypertension: Secondary | ICD-10-CM | POA: Diagnosis not present

## 2015-11-07 LAB — CBC
HEMATOCRIT: 30.5 % — AB (ref 36.0–46.0)
Hemoglobin: 9.4 g/dL — ABNORMAL LOW (ref 12.0–15.0)
MCH: 24.9 pg — ABNORMAL LOW (ref 26.0–34.0)
MCHC: 30.8 g/dL (ref 30.0–36.0)
MCV: 80.7 fL (ref 78.0–100.0)
Platelets: 161 10*3/uL (ref 150–400)
RBC: 3.78 MIL/uL — AB (ref 3.87–5.11)
RDW: 15.7 % — ABNORMAL HIGH (ref 11.5–15.5)
WBC: 8.6 10*3/uL (ref 4.0–10.5)

## 2015-11-07 MED ORDER — AMLODIPINE BESYLATE 10 MG PO TABS: 10.0000 mg | ORAL_TABLET | Freq: Every day | ORAL | Status: DC

## 2015-11-07 MED ORDER — LISINOPRIL 20 MG PO TABS: 20.0000 mg | ORAL_TABLET | Freq: Every day | ORAL | Status: DC

## 2015-11-07 MED ORDER — HYDRALAZINE HCL 20 MG/ML IJ SOLN
5.0000 mg | Freq: Once | INTRAMUSCULAR | Status: AC
  Administered 2015-11-07: 5 mg via INTRAVENOUS
  Filled 2015-11-07: qty 1

## 2015-11-07 MED ORDER — LEVETIRACETAM 1000 MG PO TABS: 1000.0000 mg | ORAL_TABLET | Freq: Two times a day (BID) | ORAL | Status: DC

## 2015-11-07 MED ORDER — PANTOPRAZOLE SODIUM 40 MG PO TBEC: 40.0000 mg | DELAYED_RELEASE_TABLET | Freq: Every day | ORAL | Status: DC

## 2015-11-07 MED ORDER — LORAZEPAM 0.5 MG PO TABS: 0.5000 mg | ORAL_TABLET | Freq: Three times a day (TID) | ORAL | Status: DC | PRN

## 2015-11-07 MED ORDER — AMLODIPINE BESYLATE 10 MG PO TABS
10.0000 mg | ORAL_TABLET | Freq: Every day | ORAL | Status: DC
  Administered 2015-11-07: 10 mg via ORAL
  Filled 2015-11-07: qty 1

## 2015-11-07 NOTE — Progress Notes (Signed)
Report was faxed & RN called facility. MD as well as the facility ([facility]) were notified of pt's elevated bp. Sanda LingerMilam, Siena Poehler R, RN

## 2015-11-07 NOTE — Progress Notes (Signed)
[facility] Anticipated DC date: 11/07/15 Family notified: Son by phone Transport by: Sharin MonsPTAR   [facility] MD [facility]. RN, patient, patient's family, and facility notified of DC. RN given number for report. DC packet on chart. Ambulance transport requested for patient.   CSW signing off.  Cristobal GoldmannNadia Keitra Car[place]one, [place] Clinical Social Worker [phone]

## 2015-11-07 NOTE — Discharge Summary (Signed)
Physician Discharge Summary  Alexandria Lowe MRN: [id number] DOB/AGE: 07-09-1955 60 y.o.  PCP: Sande Brothers, MD   Admit date: 11/04/2015 Discharge date: 11/07/2015  Discharge Diagnoses:      Seizures ([place]) Active Problems:   Dementia   Fracture of right humerus   Anemia   Essential hypertension   Hyperlipidemia Dementia      Follow-up recommendations Follow-up with PCP in 3-5 days , including all  additional recommended appointments as below Follow-up CBC, CMP in 3-5 days Follow-up with Dr. Jean Rosenthal, orthopedics in 2 weeks Continue sling for the right proximal humeral fracture     Current Discharge Medication List    START taking these medications   Details  lisinopril (PRINIVIL,ZESTRIL) 20 MG tablet Take 1 tablet (20 mg total) by mouth daily. Qty: 30 tablet, Refills: 1    pantoprazole (PROTONIX) 40 MG tablet Take 1 tablet (40 mg total) by mouth daily. Qty: 30 tablet, Refills: 1      CONTINUE these medications which have CHANGED   Details  levETIRAcetam (KEPPRA) 1000 MG tablet Take 1 tablet (1,000 mg total) by mouth 2 (two) times daily. Qty: 60 tablet, Refills: 1    LORazepam (ATIVAN) 0.5 MG tablet Take 1 tablet (0.5 mg total) by mouth every 8 (eight) hours as needed for anxiety. Qty: 30 tablet, Refills: 0      CONTINUE these medications which have NOT CHANGED   Details  acetaminophen (TYLENOL) 500 MG tablet Take 500 mg by mouth every 4 (four) hours as needed for fever.    Alum & Mag Hydroxide-Simeth (GERI-LANTA PO) Take 30 mLs by mouth every 6 (six) hours as needed (heartburn/indigestion).    brimonidine (ALPHAGAN P) 0.1 % SOLN Place 1 drop into both eyes 2 (two) times daily.    brinzolamide (AZOPT) 1 % ophthalmic suspension Place 1 drop into both eyes 2 (two) times daily.    Calcium Carbonate-Vitamin D (CALCIUM-D) 600-400 MG-UNIT TABS Take 1 tablet by mouth daily.    guaiFENesin (ROBITUSSIN) 100 MG/5ML liquid Take 200 mg by mouth every 6 (six)  hours as needed for cough.    hydrocerin (EUCERIN) CREA Apply 1 application topically daily.    loratadine (CLARITIN) 10 MG tablet Take 10 mg by mouth daily.    magnesium hydroxide (MILK OF MAGNESIA) 400 MG/5ML suspension Take 30 mLs by mouth at bedtime as needed for mild constipation.    mirtazapine (REMERON) 30 MG tablet Take 30 mg by mouth at bedtime.    mometasone (NASONEX) 50 MCG/ACT nasal spray Place 2 sprays into the nose daily.    Multiple Vitamins-Minerals (PRESERVISION AREDS PO) Take 1 tablet by mouth 2 (two) times daily.    neomycin-bacitracin-polymyxin (NEOSPORIN) [phone] ointment Apply 1 application topically as needed (until healed).    sertraline (ZOLOFT) 50 MG tablet Take 1 tablet (50 mg total) by mouth daily. Qty: 30 tablet, Refills: 12    simvastatin (ZOCOR) 10 MG tablet Take 10 mg by mouth at bedtime.    Travoprost, BAK Free, (TRAVATAN) 0.004 % SOLN ophthalmic solution Place 1 drop into both eyes at bedtime.    Norvasc 10 mg by mouth daily      STOP taking these medications     lisinopril-hydrochlorothiazide (PRINZIDE,ZESTORETIC) 20-12.5 MG per tablet      loperamide (IMODIUM) 2 MG capsule          Discharge Condition: *Overall prognosis guarded   Discharge Instructions Get Medicines reviewed and adjusted: Please take all your medications with you for your next visit with  your Primary MD  Please request your Primary MD to go over all hospital tests and procedure/radiological results at the follow up, please ask your Primary MD to get all Hospital records sent to his/her office.  If you experience worsening of your admission symptoms, develop shortness of breath, life threatening emergency, suicidal or homicidal thoughts you must seek medical attention immediately by calling 911 or calling your MD immediately if symptoms less severe.  You must read complete instructions/literature along with all the possible adverse reactions/side effects for all  the Medicines you take and that have been prescribed to you. Take any new Medicines after you have completely understood and accpet all the possible adverse reactions/side effects.   Do not drive when taking Pain medications.   Do not take more than prescribed Pain, Sleep and Anxiety Medications  Special Instructions: If you have smoked or chewed Tobacco in the last 2 yrs please stop smoking, stop any regular Alcohol and or any Recreational drug use.  Wear Seat belts while driving.  Please note  You were cared for by a hospitalist during your hospital stay. Once you are discharged, your primary care physician will handle any further medical issues. Please note that NO REFILLS for any discharge medications will be authorized once you are discharged, as it is imperative that you return to your primary care physician (or establish a relationship with a primary care physician if you do not have one) for your aftercare needs so that they can reassess your need for medications and monitor your lab values.     No Known Allergies    Disposition: SNF   Consults: Orthopedics    Significant Diagnostic Studies:  Dg Chest 2 View  11/04/2015  CLINICAL DATA:  Breakthrough seizure, evaluate for pneumonia ; history of Alzheimer's disease. EXAM: CHEST  2 VIEW COMPARISON:  Chest x-ray of June 01, 2015 FINDINGS: The lungs are well-expanded and clear. The heart and pulmonary vascularity are normal. The mediastinum is normal in width. There is no pleural effusion. There is multilevel degenerative disc disease of the thoracic spine. There is an acute displaced fracture of the neck of the right humerus. IMPRESSION: Acute displaced fracture of the neck of the right humerus. There is no evidence of aspiration nor other acute cardiopulmonary abnormality. This report was called by me to to Dr Oleta Mouse in the the [facility] emergency department at 4:37 p.m. on 04 February 2016. Electronically Signed   By: David   Martinique M.D.   On: 11/04/2015 16:38   Ct Head Wo Contrast  11/04/2015  CLINICAL DATA:  Seizure.  History of seizure Alzheimer's. EXAM: CT HEAD WITHOUT CONTRAST TECHNIQUE: Contiguous axial images were obtained from the base of the skull through the vertex without intravenous contrast. COMPARISON:  June 01, 2015 FINDINGS: The patient was scanned twice due to motion. Paranasal sinuses, mastoid air cells, and middle ears are unremarkable. Extracranial soft tissues are unremarkable. No subdural, epidural, or subarachnoid hemorrhage. Ventricles and sulci are prominent but stable. No mass, mass effect, or midline shift. Scattered white matter changes are identified. No acute cortical ischemia or infarct. Cerebellum, brainstem, and basal cisterns are normal. IMPRESSION: No acute abnormality. Electronically Signed   By: Dorise Bullion III M.D   On: 11/04/2015 16:17   Dg Humerus Right  11/04/2015  CLINICAL DATA:  Pain and deformity. EXAM: RIGHT HUMERUS - 2+ VIEW COMPARISON:  06/01/2015 FINDINGS: Complete fracture at the neck of the humerus displaced anteriorly by the width of the bone. There may  be an intra-articular fragment adjacent to the humeral head. IMPRESSION: Complete humeral neck fracture, displaced anteriorly. Electronically Signed   By: Nelson Chimes M.D.   On: 11/04/2015 18:53       Filed Weights   11/04/15 1416 11/04/15 2146  Weight: 74.844 kg (165 lb) 70.716 kg (155 lb 14.4 oz)     Microbiology: No results found for this or any previous visit (from the past 240 hour(s)).     Blood Culture    Component Value Date/Time   SDES BLOOD LEFT HAND 06/01/2015 1005   SPECREQUEST BOTTLES DRAWN AEROBIC ONLY 5CC 06/01/2015 1005   CULT NO GROWTH 5 DAYS 06/01/2015 1005   REPTSTATUS 06/06/2015 FINAL 06/01/2015 1005      Labs: Results for orders placed or performed during the hospital encounter of 11/04/15 (from the past 48 hour(s))  CBC     Status: Abnormal   Collection Time: 11/06/15  5:26 AM   Result Value Ref Range   WBC 7.8 4.0 - 10.5 K/uL   RBC 3.22 (L) 3.87 - 5.11 MIL/uL   Hemoglobin 8.2 (L) 12.0 - 15.0 g/dL   HCT 25.7 (L) 36.0 - 46.0 %   MCV 79.8 78.0 - 100.0 fL   MCH 25.5 (L) 26.0 - 34.0 pg   MCHC 31.9 30.0 - 36.0 g/dL   RDW 15.8 (H) 11.5 - 15.5 %   Platelets 178 150 - 400 K/uL  Comprehensive metabolic panel     Status: Abnormal   Collection Time: 11/06/15  5:26 AM  Result Value Ref Range   Sodium 140 135 - 145 mmol/L   Potassium 3.7 3.5 - 5.1 mmol/L   Chloride 113 (H) 101 - 111 mmol/L   CO2 21 (L) 22 - 32 mmol/L   Glucose, Bld 104 (H) 65 - 99 mg/dL   BUN 6 6 - 20 mg/dL   Creatinine, Ser 0.85 0.44 - 1.00 mg/dL   Calcium 8.5 (L) 8.9 - 10.3 mg/dL   Total Protein 5.4 (L) 6.5 - 8.1 g/dL   Albumin 2.8 (L) 3.5 - 5.0 g/dL   AST 28 15 - 41 U/L   ALT 15 14 - 54 U/L   Alkaline Phosphatase 52 38 - 126 U/L   Total Bilirubin 0.7 0.3 - 1.2 mg/dL   GFR calc non Af Amer >60 >60 mL/min   GFR calc Af Amer >60 >60 mL/min    Comment: (NOTE) The eGFR has been calculated using the CKD EPI equation. This calculation has not been validated in all clinical situations. eGFR's persistently <60 mL/min signify possible Chronic Kidney Disease.    Anion gap 6 5 - 15  Vitamin B12     Status: None   Collection Time: 11/06/15 12:12 PM  Result Value Ref Range   Vitamin B-12 289 180 - 914 pg/mL    Comment: (NOTE) This assay is not validated for testing neonatal or myeloproliferative syndrome specimens for Vitamin B12 levels.   Folate     Status: None   Collection Time: 11/06/15 12:12 PM  Result Value Ref Range   Folate 20.4 >5.9 ng/mL  Iron and TIBC     Status: Abnormal   Collection Time: 11/06/15 12:12 PM  Result Value Ref Range   Iron 12 (L) 28 - 170 ug/dL   TIBC 189 (L) 250 - 450 ug/dL   Saturation Ratios 6 (L) 10.4 - 31.8 %   UIBC 177 ug/dL  Ferritin     Status: None   Collection Time: 11/06/15 12:12 PM  Result Value Ref Range   Ferritin 62 11 - 307 ng/mL  Reticulocytes      Status: Abnormal   Collection Time: 11/06/15 12:12 PM  Result Value Ref Range   Retic Ct Pct 0.8 0.4 - 3.1 %   RBC. 3.10 (L) 3.87 - 5.11 MIL/uL   Retic Count, Manual 24.8 19.0 - 186.0 K/uL  CBC     Status: Abnormal   Collection Time: 11/07/15  5:22 AM  Result Value Ref Range   WBC 8.6 4.0 - 10.5 K/uL   RBC 3.78 (L) 3.87 - 5.11 MIL/uL   Hemoglobin 9.4 (L) 12.0 - 15.0 g/dL   HCT 30.5 (L) 36.0 - 46.0 %   MCV 80.7 78.0 - 100.0 fL   MCH 24.9 (L) 26.0 - 34.0 pg   MCHC 30.8 30.0 - 36.0 g/dL   RDW 15.7 (H) 11.5 - 15.5 %   Platelets 161 150 - 400 K/uL    Comment: PLATELET COUNT CONFIRMED BY SMEAR     Lipid Panel  No results found for: CHOL, TRIG, HDL, CHOLHDL, VLDL, LDLCALC, LDLDIRECT   Lab Results  Component Value Date   HGBA1C 5.7* 03/11/2013     Lab Results  Component Value Date   CREATININE 0.85 11/06/2015     60 y.o. female with medical history significant of HTN, HLD, Alzheimer's disease onset 2011, and seizure disorder; who presented after having a seizure. History is obtained from review of records as patient is mostly nonverbal at baseline. On 6/9 patient was walking back to her room had stiffened up and had to be assisted to the ground. Patient apparently remained this way from anywhere from 3-4 minutes.At baseline is almost nonverbal and does not engage in meaningful conversation  Hospital course  Recurrent seizures with history of seizure disorder: . Patient had 2 breakthrough seizures despite being on 750 mg of Keppra twice a day. CT of the brain showed no acute abnormalities. Patient was loaded with thousand milligrams N the ED. Neurology was curbsided and recommended increasing Keppra dose to 1000 mg twice a Telemetry shows normal sinus rhythm Continue seizure precautions  - stable . neuro status- PT/OT evaluation   patient is functionally at baseline and no further imaging studies are needed Speech therapy evaluation recommends regular diet with thin  liquids Cont Keppra 1000 mg BID po/IV depending on swallow evaluation      Right humerus fracture: Acute. The fracture likely happened during patient's seizure. X-ray revealed a proximal humeral neck fracture. Orthopedics Dr. Ninfa Linden consulted in the ED and recommended placing arm in a sling. Given the state of her dementia as well as her recent seizure history, we will try to treat this fracture non-operatively in a sling only. No urgent indication for surgery. Will likely align better and heal with time. Continue her sling for at least the next 2 weeks with outpatient follow-up   Hyperglycemia: Glucose elevated at 180 admission likely secondary to acute stress with recent recent seizures. - Continue to monitor and   hemoglobin A1c pending   Anemia of chronic ds : Hemoglobin 11.2>9.1>8.2 dropped since admission. Baseline hemoglobin around 11.1, now 9.4 No active signs of bleeding Continue PPI   Depression/ anxiety - Continue Zoloft and Remeron  Dementia: Patient previously had been tried on Namenda but this was recently stopped as there is no benefit seen to this medication. - Continue to monitor  Essential hypertension - Continue lisinopril- HCTZ , cont lisinopril , added Norvasc   Hyperlipidemia - Continue simvastatin  Hypokalemia-likely secondary to HCTZ, continue to hold HCTZ    Discharge Exam:    Blood pressure 195/72, pulse 75, temperature 98 F (36.7 C), temperature source Oral, resp. rate 19, height _0  (1.727 m), weight 70.716 kg (155 lb 14.4 oz), SpO2 100 %.  Neck: normal, supple, no masses, no thyromegaly Respiratory: clear to auscultation bilaterally, no wheezing, no crackles. Normal respiratory effort. No accessory muscle use.  Cardiovascular: Regular rate and rhythm, no murmurs / rubs / gallops. No extremity edema. 2+ pedal pulses. No carotid bruits.  Abdomen: no tenderness, no masses palpated. No hepatosplenomegaly. Bowel sounds positive.   Musculoskeletal: no clubbing / cyanosis. Right shoulder is in a sling currently with mild swelling and tenderness to palpation of the proximal humeral head. Skin: no rashes, lesions, ulcers. No induration Neurologic: Moving all 4 extremities  Psychiatric: Patient is mostly nonverbal with poor memory and appears confused    Follow-up Information    Follow up with Mcarthur Rossetti, MD. Schedule an appointment as soon as possible for a visit in 2 weeks.   Specialty:  Orthopedic Surgery   Contact information:   [place] [address] [phone]       Follow up with [facility], MD. Schedule an appointment as soon as possible for a visit in 3 days.   Specialty:  Internal Medicine   Why:  Hospital follow-up      Signed: Reyne Dumas 11/07/2015, 12:44 PM        Time spent >45 mins

## 2015-11-07 NOTE — NC FL2 (Signed)
[place] MEDICAID FL2 LEVEL OF CARE SCREENING TOOL     IDENTIFICATION  Patient Name: Alexandria Lowe Birthdate: [birth year] Sex: female Admission Date (Current Location): 11/04/2015  [facility] and [place] Number:  Producer, television/film/video and Address:  [facility] [place], [address], [place], [place] [address]      Provider Number: [id number]  Attending Physician Name and Address:  Richarda Overlie, MD  Relative Name and Phone Number:  Cristal Deer, son, [phone]    Current Level of Care: Hospital Recommended Level of Care: Other (SCU) Prior Approval Number:    Date Approved/Denied:   PASRR Number:    Discharge Plan: Other (Comment) SCU    Current Diagnoses: Patient Active Problem List   Diagnosis Date Noted  . Fracture of right humerus 11/05/2015  . Anemia 11/05/2015  . Essential hypertension 11/05/2015  . Hyperlipidemia 11/05/2015  . "Walking corpse" syndrome   . Seizures ([facility]) 11/04/2015  . Contusion   . Altered mental status 06/01/2015  . Seizure ([facility])   . Alzheimer's disease 06/17/2014  . Convulsions/seizures ([facility]) 06/17/2014  . Hypertensive urgency 03/10/2013  . Hypokalemia 03/10/2013  . Pre-syncope 03/10/2013  . Dementia     Orientation RESPIRATION BLADDER Height & Weight      (Disoriented x4)  Normal Incontinent Weight: 70.716 kg (155 lb 14.4 oz) Height:   (172.7 cm)  BEHAVIORAL SYMPTOMS/MOOD NEUROLOGICAL BOWEL NUTRITION STATUS   N/A  Seizures Incontinent Diet (Regular)  AMBULATORY STATUS COMMUNICATION OF NEEDS Skin   Limited Assist Verbally Normal                       Personal Care Assistance Level of Assistance  Bathing, Feeding, Dressing Bathing Assistance: Maximum assistance Feeding assistance: Limited assistance Dressing Assistance: Maximum assistance     Functional Limitations Info  Speech     Speech Info: Impaired (Nonverbal)    SPECIAL CARE FACTORS FREQUENCY  PT (By licensed PT)     PT Frequency: min  3x/week              Contractures      Additional Factors Info  Code Status, Allergies, Psychotropic Code Status Info: Full Allergies Info: NKA Psychotropic Info: sertraline (ZOLOFT) tablet 75 mg         Current Medications (11/07/2015):    Discharge Medications: START taking these medications   Details  lisinopril (PRINIVIL,ZESTRIL) 20 MG tablet Take 1 tablet (20 mg total) by mouth daily. Qty: 30 tablet, Refills: 1    pantoprazole (PROTONIX) 40 MG tablet Take 1 tablet (40 mg total) by mouth daily. Qty: 30 tablet, Refills: 1      CONTINUE these medications which have CHANGED   Details  levETIRAcetam (KEPPRA) 1000 MG tablet Take 1 tablet (1,000 mg total) by mouth 2 (two) times daily. Qty: 60 tablet, Refills: 1    LORazepam (ATIVAN) 0.5 MG tablet Take 1 tablet (0.5 mg total) by mouth every 8 (eight) hours as needed for anxiety. Qty: 30 tablet, Refills: 0      CONTINUE these medications which have NOT CHANGED   Details  acetaminophen (TYLENOL) 500 MG tablet Take 500 mg by mouth every 4 (four) hours as needed for fever.    Alum & Mag Hydroxide-Simeth (GERI-LANTA PO) Take 30 mLs by mouth every 6 (six) hours as needed (heartburn/indigestion).    brimonidine (ALPHAGAN P) 0.1 % SOLN Place 1 drop into both eyes 2 (two) times daily.    brinzolamide (AZOPT) 1 % ophthalmic  suspension Place 1 drop into both eyes 2 (two) times daily.    Calcium Carbonate-Vitamin D (CALCIUM-D) 600-400 MG-UNIT TABS Take 1 tablet by mouth daily.    guaiFENesin (ROBITUSSIN) 100 MG/5ML liquid Take 200 mg by mouth every 6 (six) hours as needed for cough.    hydrocerin (EUCERIN) CREA Apply 1 application topically daily.    loratadine (CLARITIN) 10 MG tablet Take 10 mg by mouth daily.    magnesium hydroxide (MILK OF MAGNESIA) 400 MG/5ML suspension Take 30 mLs by mouth at bedtime as needed for mild constipation.    mirtazapine (REMERON) 30 MG  tablet Take 30 mg by mouth at bedtime.    mometasone (NASONEX) 50 MCG/ACT nasal spray Place 2 sprays into the nose daily.    Multiple Vitamins-Minerals (PRESERVISION AREDS PO) Take 1 tablet by mouth 2 (two) times daily.    neomycin-bacitracin-polymyxin (NEOSPORIN) [id number] ointment Apply 1 application topically as needed (until healed).    sertraline (ZOLOFT) 50 MG tablet Take 1 tablet (50 mg total) by mouth daily. Qty: 30 tablet, Refills: 12    simvastatin (ZOCOR) 10 MG tablet Take 10 mg by mouth at bedtime.    Travoprost, BAK Free, (TRAVATAN) 0.004 % SOLN ophthalmic solution Place 1 drop into both eyes at bedtime.    Norvasc 10 mg by mouth daily     STOP taking these medications     lisinopril-hydrochlorothiazide (PRINZIDE,ZESTORETIC) 20-12.5 MG per tablet      loperamide (IMODIUM) 2 MG capsule            Relevant Imaging Results:  Relevant Lab Results:   Additional Information SSN [id number]  Mearl LatinNadia S Quadir Muns, LCSWA

## 2015-11-08 ENCOUNTER — Encounter (HOSPITAL_COMMUNITY): Payer: Self-pay | Admitting: Nurse Practitioner

## 2015-11-08 ENCOUNTER — Emergency Department (HOSPITAL_COMMUNITY)
Admission: EM | Admit: 2015-11-08 | Discharge: 2015-11-08 | Disposition: A | Discharge status: Home / Self Care | Payer: Medicare HMO | Attending: Emergency Medicine | Admitting: Emergency Medicine

## 2015-11-08 DIAGNOSIS — F329 Major depressive disorder, single episode, unspecified: Secondary | ICD-10-CM | POA: Diagnosis not present

## 2015-11-08 DIAGNOSIS — G309 Alzheimer's disease, unspecified: Secondary | ICD-10-CM | POA: Diagnosis not present

## 2015-11-08 DIAGNOSIS — Z8669 Personal history of other diseases of the nervous system and sense organs: Secondary | ICD-10-CM | POA: Diagnosis not present

## 2015-11-08 DIAGNOSIS — I10 Essential (primary) hypertension: Secondary | ICD-10-CM | POA: Insufficient documentation

## 2015-11-08 DIAGNOSIS — Z79899 Other long term (current) drug therapy: Secondary | ICD-10-CM | POA: Diagnosis not present

## 2015-11-08 DIAGNOSIS — Z043 Encounter for examination and observation following other accident: Secondary | ICD-10-CM | POA: Insufficient documentation

## 2015-11-08 DIAGNOSIS — Z7951 Long term (current) use of inhaled steroids: Secondary | ICD-10-CM | POA: Diagnosis not present

## 2015-11-08 DIAGNOSIS — W19XXXA Unspecified fall, initial encounter: Secondary | ICD-10-CM

## 2015-11-08 LAB — HEMOGLOBIN A1C
Hgb A1c MFr Bld: 5.2 % (ref 4.8–5.6)
MEAN PLASMA GLUCOSE: 103 mg/dL

## 2015-11-08 NOTE — ED Notes (Signed)
Patient ambulated 25 ft. With 1 assist.

## 2015-11-08 NOTE — ED Provider Notes (Signed)
CSN: [id number]     Arrival date & time 11/08/15  1338 History   First MD Initiated Contact with Patient 11/08/15 1405     Chief Complaint  Patient presents with  . Fall     (Consider location/radiation/quality/duration/timing/severity/associated sxs/prior Treatment) HPI   Alexandria Lowe is a 60 y.o. female who presents for evaluation of a fall. She is unable to contribute to history due to Alzheimer's disease. She was discharged from the hospital yesterday after evaluation and treatment for injury from fall, associated with seizure. She has a right humerus fracture, which is being followed expectantly, treated with a sling.  Level V caveat-dementia   Past Medical History  Diagnosis Date  . Hypertension   . Convulsions/seizures (HCC) 06/17/2014  . Depression   . Alzheimer's disease 06/17/2014  . Nonverbal     "for awhile now"/sister (06/01/2015)   Past Surgical History  Procedure Laterality Date  . Vaginal hysterectomy    . Dilation and curettage of uterus     Family History  Problem Relation Age of Onset  . Cancer Mother     lung  . Hypertension Sister   . Thyroid disease Sister   . Hypertension Brother   . Diabetes Brother    Social History  Substance Use Topics  . Smoking status: Never Smoker   . Smokeless tobacco: Never Used  . Alcohol Use: No   OB History    No data available     Review of Systems  Unable to perform ROS: Dementia      Allergies  Review of patient's allergies indicates no known allergies.  Home Medications   Prior to Admission medications   Medication Sig Start Date End Date Taking? Authorizing Provider  acetaminophen (TYLENOL) 500 MG tablet Take 500 mg by mouth every 4 (four) hours as needed for fever.   Yes Historical Provider, MD  alum & mag hydroxide-simeth (MINTOX) 200-200-20 MG/5ML suspension Take 30 mLs by mouth every 6 (six) hours as needed for indigestion or heartburn.   Yes Historical Provider, MD  brimonidine (ALPHAGAN P) 0.1  % SOLN Place 1 drop into both eyes 2 (two) times daily.   Yes Historical Provider, MD  brinzolamide (AZOPT) 1 % ophthalmic suspension Place 1 drop into both eyes 2 (two) times daily.   Yes Historical Provider, MD  Calcium Carbonate-Vitamin D (CALCIUM-D) 600-400 MG-UNIT TABS Take 1 tablet by mouth daily.   Yes Historical Provider, MD  guaiFENesin (ROBITUSSIN) 100 MG/5ML liquid Take 200 mg by mouth every 6 (six) hours as needed for cough.   Yes Historical Provider, MD  levETIRAcetam (KEPPRA) 750 MG tablet Take 750 mg by mouth 2 (two) times daily.   Yes Historical Provider, MD  lisinopril-hydrochlorothiazide (PRINZIDE,ZESTORETIC) 20-12.5 MG tablet Take 1 tablet by mouth daily.   Yes Historical Provider, MD  loperamide (IMODIUM) 2 MG capsule Take 2 mg by mouth as needed for diarrhea or loose stools.   Yes Historical Provider, MD  loratadine (CLARITIN) 10 MG tablet Take 10 mg by mouth daily.   Yes Historical Provider, MD  LORazepam (ATIVAN) 0.5 MG tablet Take 1 tablet (0.5 mg total) by mouth every 8 (eight) hours as needed for anxiety. Patient taking differently: Take 0.25 mg by mouth every evening.  11/07/15  Yes Richarda Overlie, MD  LORazepam (ATIVAN) 0.5 MG tablet Take 0.5 mg by mouth every 8 (eight) hours as needed for anxiety (unknown).   Yes Historical Provider, MD  magnesium hydroxide (MILK OF MAGNESIA) 400 MG/5ML suspension Take 30  mLs by mouth at bedtime as needed for mild constipation.   Yes Historical Provider, MD  mirtazapine (REMERON) 30 MG tablet Take 30 mg by mouth at bedtime.   Yes Historical Provider, MD  mometasone (NASONEX) 50 MCG/ACT nasal spray Place 2 sprays into the nose daily.   Yes Historical Provider, MD  Multiple Vitamins-Minerals (PRESERVISION AREDS PO) Take 1 tablet by mouth 2 (two) times daily.   Yes Historical Provider, MD  neomycin-bacitracin-polymyxin (NEOSPORIN) [phone] ointment Apply 1 application topically as needed (until healed).   Yes Historical Provider, MD   sertraline (ZOLOFT) 50 MG tablet Take 1 tablet (50 mg total) by mouth daily. Patient taking differently: Take 75 mg by mouth daily.  03/06/13  Yes Levert Feinstein, MD  simvastatin (ZOCOR) 10 MG tablet Take 10 mg by mouth at bedtime.   Yes Historical Provider, MD  Skin Protectants, Misc. (MINERIN) CREA Apply 1 application topically daily.   Yes Historical Provider, MD  Travoprost, BAK Free, (TRAVATAN) 0.004 % SOLN ophthalmic solution Place 1 drop into both eyes at bedtime.   Yes Historical Provider, MD  amLODipine (NORVASC) 10 MG tablet Take 1 tablet (10 mg total) by mouth daily. Patient not taking: Reported on 11/08/2015 11/07/15   Richarda Overlie, MD  levETIRAcetam (KEPPRA) 1000 MG tablet Take 1 tablet (1,000 mg total) by mouth 2 (two) times daily. Patient not taking: Reported on 11/08/2015 11/07/15   Richarda Overlie, MD  lisinopril (PRINIVIL,ZESTRIL) 20 MG tablet Take 1 tablet (20 mg total) by mouth daily. Patient not taking: Reported on 11/08/2015 11/07/15   Richarda Overlie, MD  pantoprazole (PROTONIX) 40 MG tablet Take 1 tablet (40 mg total) by mouth daily. Patient not taking: Reported on 11/08/2015 11/07/15   Richarda Overlie, MD   BP 112/79 mmHg  Pulse 92  Temp(Src) 98.6 F (37 C) (Oral)  Resp 18  SpO2 96% Physical Exam  Constitutional: She appears well-developed.  Appears older than stated age.  HENT:  Head: Normocephalic and atraumatic.  Right Ear: External ear normal.  Left Ear: External ear normal.  No palpable or visual injury of the face or scalp, or cranium.  Eyes: Conjunctivae and EOM are normal. Pupils are equal, round, and reactive to light.  Neck: Normal range of motion and phonation normal. Neck supple.  Cardiovascular: Normal rate, regular rhythm and normal heart sounds.   Pulmonary/Chest: Effort normal and breath sounds normal. She exhibits no bony tenderness.  Abdominal: Soft. There is no tenderness.  Musculoskeletal: Normal range of motion.  Normal and passive range of motion of both  legs and left arm. Right arm is in a sling, there is mild tenderness around the right shoulder, without significant palpable defect.  Neurological: She is alert. No cranial nerve deficit or sensory deficit. She exhibits normal muscle tone. Coordination normal.  Skin: Skin is warm, dry and intact.  Psychiatric: She has a normal mood and affect. Her behavior is normal.  Nursing note and vitals reviewed.   ED Course  Procedures (including critical care time) Initial clinical impression.- Patient with fall at her nursing care facility. No outward sign of injury. Hospitalized until yesterday, as treatment for seizures and right humerus fracture. Today I doubt that there is a significant infectious or metabolic abnormality. Patient will be ambulated, to see if she is at her baseline relative to ability to walk.  Medications - No data to display  Patient Vitals for the past 24 hrs:  BP Temp Temp src Pulse Resp SpO2  11/08/15 1354 112/79 mmHg 98.6  F (37 C) Oral 92 18 96 %    3:22 PM Reevaluation with update and discussion. After initial assessment and treatment, an updated evaluation reveals no change in clinical status. She was able to ambulate, with minimal assist.. Abem Shaddix L     Labs Review Labs Reviewed - No data to display  Imaging Review No results found. I have personally reviewed and evaluated these images and lab results as part of my medical decision-making.   EKG Interpretation None      MDM   Final diagnoses:  Fall, initial encounter    Fall without serious injury. Patient was discharged, from the hospital yesterday after treatment. There is no indication for a new unstable condition, or injury.  Nursing Notes Reviewed/ Care Coordinated Applicable Imaging Reviewed Interpretation of Laboratory Data incorporated into ED treatment  The patient appears reasonably screened and/or stabilized for discharge and I doubt any other medical condition or other [facility]  requiring further screening, evaluation, or treatment in the ED at this time prior to discharge.  Plan: Home Medications- usual; Home Treatments- rest, up with assist; return here if the recommended treatment, does not improve the symptoms; Recommended follow up- Ortho as scheduled for humerus fracture     Mancel BaleElliott Watson Robarge, MD 11/08/15 1523

## 2015-11-08 NOTE — Discharge Instructions (Signed)
Make sure you get help when you attempt to walk. Follow-up with your orthopedic doctor as scheduled for treatment of the humerus fracture.   Fall Prevention in Hospitals, Adult As a hospital patient, your condition and the treatments you receive can increase your risk for falls. Some additional risk factors for falls in a hospital include:  Being in an unfamiliar environment.  Being on bed rest.  Your surgery.  Taking certain medicines.  Your tubing requirements, such as intravenous (IV) therapy or catheters. It is important that you learn how to decrease fall risks while at the hospital. Below are important tips that can help prevent falls. SAFETY TIPS FOR PREVENTING FALLS Talk about your risk of falling.  Ask your health care provider why you are at risk for falling. Is it your medicine, illness, tubing placement, or something else?  Make a plan with your health care provider to keep you safe from falls.  Ask your health care provider or pharmacist about side effects of your medicines. Some medicines can make you dizzy or affect your coordination. Ask for help.  Ask for help before getting out of bed. You may need to press your call button.  Ask for assistance in getting safely to the toilet.  Ask for a walker or cane to be put at your bedside. Ask that most of the side rails on your bed be placed up before your health care provider leaves the room.  Ask family or friends to sit with you.  Ask for things that are out of your reach, such as your glasses, hearing aids, telephone, bedside table, or call button. Follow these tips to avoid falling:  Stay lying or seated, rather than standing, while waiting for help.  Wear rubber-soled slippers or shoes whenever you walk in the hospital.  Avoid quick, sudden movements.  Change positions slowly.  Sit on the side of your bed before standing.  Stand up slowly and wait before you start to walk.  Let your health care provider  know if there is a spill on the floor.  Pay careful attention to the medical equipment, electrical cords, and tubes around you.  When you need help, use your call button by your bed or in the bathroom. Wait for one of your health care providers to help you.  If you feel dizzy or unsure of your footing, return to bed and wait for assistance.  Avoid being distracted by the TV, telephone, or another person in your room.  Do not lean or support yourself on rolling objects, such as IV poles or bedside tables.   This information is not intended to replace advice given to you by your health care provider. Make sure you discuss any questions you have with your health care provider.   Document Released: 05/11/2000 Document Revised: 06/04/2014 Document Reviewed: 01/20/2012 Elsevier Interactive Patient Education [employer]2016 [employer] Inc.

## 2015-11-08 NOTE — ED Notes (Signed)
[facility] suffering a mechanical [facility]. EMS was unsure of point of impact. Patient has advanced dementia and does not communicate well. Presents in C-collar. No obvious deformities. Right arm in sling from previous admission.

## 2015-12-14 ENCOUNTER — Emergency Department (HOSPITAL_COMMUNITY)
Admission: EM | Admit: 2015-12-14 | Discharge: 2015-12-14 | Disposition: A | Discharge status: Home / Self Care | Payer: Medicare HMO | Attending: Emergency Medicine | Admitting: Emergency Medicine

## 2015-12-14 ENCOUNTER — Encounter (HOSPITAL_COMMUNITY): Payer: Self-pay | Admitting: Emergency Medicine

## 2015-12-14 ENCOUNTER — Emergency Department (HOSPITAL_COMMUNITY): Payer: Medicare HMO

## 2015-12-14 DIAGNOSIS — F329 Major depressive disorder, single episode, unspecified: Secondary | ICD-10-CM | POA: Insufficient documentation

## 2015-12-14 DIAGNOSIS — I10 Essential (primary) hypertension: Secondary | ICD-10-CM | POA: Insufficient documentation

## 2015-12-14 DIAGNOSIS — S52121P Displaced fracture of head of right radius, subsequent encounter for closed fracture with malunion: Secondary | ICD-10-CM | POA: Diagnosis not present

## 2015-12-14 DIAGNOSIS — W07XXXD Fall from chair, subsequent encounter: Secondary | ICD-10-CM | POA: Diagnosis not present

## 2015-12-14 DIAGNOSIS — Z79899 Other long term (current) drug therapy: Secondary | ICD-10-CM | POA: Insufficient documentation

## 2015-12-14 DIAGNOSIS — G309 Alzheimer's disease, unspecified: Secondary | ICD-10-CM | POA: Insufficient documentation

## 2015-12-14 DIAGNOSIS — S42301P Unspecified fracture of shaft of humerus, right arm, subsequent encounter for fracture with malunion: Secondary | ICD-10-CM

## 2015-12-14 DIAGNOSIS — W1800XD Striking against unspecified object with subsequent fall, subsequent encounter: Secondary | ICD-10-CM | POA: Insufficient documentation

## 2015-12-14 DIAGNOSIS — S6991XD Unspecified injury of right wrist, hand and finger(s), subsequent encounter: Secondary | ICD-10-CM | POA: Diagnosis present

## 2015-12-14 MED ORDER — ACETAMINOPHEN 500 MG PO TABS
1000.0000 mg | ORAL_TABLET | Freq: Once | ORAL | Status: AC
  Administered 2015-12-14: 1000 mg via ORAL
  Filled 2015-12-14: qty 2

## 2015-12-14 NOTE — ED Provider Notes (Signed)
CSN: [id number]     Arrival date & time 12/14/15  1248 History   First MD Initiated Contact with Patient 12/14/15 1258     Chief Complaint  Patient presents with  . Fall     (Consider location/radiation/quality/duration/timing/severity/associated sxs/prior Treatment) Patient is a 60 y.o. female presenting with fall. The history is provided by the patient.  Fall This is a new problem. The current episode started less than 1 hour ago. The problem occurs constantly. The problem has not changed since onset.Pertinent negatives include no chest pain, no headaches and no shortness of breath. Nothing aggravates the symptoms. Nothing relieves the symptoms. She has tried nothing for the symptoms. The treatment provided no relief.   60 yo F With a chief complaint of a fall. This is witnessed at a nursing home. Thinks she fell onto her right side. At baseline per the nursing home. Patient has Alzheimer's disease and is unable to communicate with me. Level 5 caveat dementia.  Past Medical History  Diagnosis Date  . Hypertension   . Convulsions/seizures (HCC) 06/17/2014  . Depression   . Alzheimer's disease 06/17/2014  . Nonverbal     "for awhile now"/sister (06/01/2015)   Past Surgical History  Procedure Laterality Date  . Vaginal hysterectomy    . Dilation and curettage of uterus     Family History  Problem Relation Age of Onset  . Cancer Mother     lung  . Hypertension Sister   . Thyroid disease Sister   . Hypertension Brother   . Diabetes Brother    Social History  Substance Use Topics  . Smoking status: Never Smoker   . Smokeless tobacco: Never Used  . Alcohol Use: No   OB History    No data available     Review of Systems  Unable to perform ROS: Mental status change  Constitutional: Negative for fever and chills.  HENT: Negative for congestion and rhinorrhea.   Eyes: Negative for redness and visual disturbance.  Respiratory: Negative for shortness of breath and wheezing.    Cardiovascular: Negative for chest pain and palpitations.  Gastrointestinal: Negative for nausea and vomiting.  Genitourinary: Negative for dysuria and urgency.  Musculoskeletal: Negative for myalgias and arthralgias.  Skin: Negative for pallor and wound.  Neurological: Negative for dizziness and headaches.      Allergies  Review of patient's allergies indicates no known allergies.  Home Medications   Prior to Admission medications   Medication Sig Start Date End Date Taking? Authorizing Provider  acetaminophen (TYLENOL) 500 MG tablet Take 500 mg by mouth every 4 (four) hours as needed for mild pain, moderate pain, fever or headache.    Yes Historical Provider, MD  alum & mag hydroxide-simeth (MINTOX) 200-200-20 MG/5ML suspension Take 30 mLs by mouth every 6 (six) hours as needed for indigestion or heartburn.   Yes Historical Provider, MD  brimonidine (ALPHAGAN P) 0.1 % SOLN Place 1 drop into both eyes 2 (two) times daily.   Yes Historical Provider, MD  brinzolamide (AZOPT) 1 % ophthalmic suspension Place 1 drop into both eyes 2 (two) times daily.   Yes Historical Provider, MD  Calcium Carbonate-Vitamin D (CALCIUM-D) 600-400 MG-UNIT TABS Take 1 tablet by mouth daily with breakfast.    Yes Historical Provider, MD  guaiFENesin (ROBITUSSIN) 100 MG/5ML liquid Take 200 mg by mouth every 6 (six) hours as needed for cough.   Yes Historical Provider, MD  HYDROcodone-acetaminophen (NORCO/VICODIN) 5-325 MG tablet Take 1 tablet by mouth 3 (three) times  daily.   Yes Historical Provider, MD  levETIRAcetam (KEPPRA) 750 MG tablet Take 750 mg by mouth 2 (two) times daily.   Yes Historical Provider, MD  lisinopril-hydrochlorothiazide (PRINZIDE,ZESTORETIC) 20-12.5 MG tablet Take 1 tablet by mouth daily with breakfast.    Yes Historical Provider, MD  loperamide (IMODIUM) 2 MG capsule Take 2 mg by mouth as needed for diarrhea or loose stools.   Yes Historical Provider, MD  loratadine (CLARITIN) 10 MG tablet  Take 10 mg by mouth daily with breakfast.    Yes Historical Provider, MD  LORazepam (ATIVAN) 0.5 MG tablet Take 1 tablet (0.5 mg total) by mouth every 8 (eight) hours as needed for anxiety. Patient taking differently: Take 0.25 mg by mouth every evening.  11/07/15  Yes Richarda Overlie, MD  LORazepam (ATIVAN) 0.5 MG tablet Take 0.5 mg by mouth every 8 (eight) hours as needed for anxiety.    Yes Historical Provider, MD  magnesium hydroxide (MILK OF MAGNESIA) 400 MG/5ML suspension Take 30 mLs by mouth at bedtime as needed for mild constipation.   Yes Historical Provider, MD  mirtazapine (REMERON) 30 MG tablet Take 30 mg by mouth at bedtime.   Yes Historical Provider, MD  mometasone (NASONEX) 50 MCG/ACT nasal spray Place 2 sprays into the nose daily.   Yes Historical Provider, MD  Multiple Vitamins-Minerals (PRESERVISION AREDS PO) Take 1 tablet by mouth 2 (two) times daily with a meal.    Yes Historical Provider, MD  neomycin-bacitracin-polymyxin (NEOSPORIN) [phone] ointment Apply 1 application topically as needed (for wound care).    Yes Historical Provider, MD  sertraline (ZOLOFT) 50 MG tablet Take 1 tablet (50 mg total) by mouth daily. Patient taking differently: Take 75 mg by mouth daily with breakfast.  03/06/13  Yes Levert Feinstein, MD  simvastatin (ZOCOR) 10 MG tablet Take 10 mg by mouth at bedtime.   Yes Historical Provider, MD  Skin Protectants, Misc. (MINERIN) CREA Apply 1 application topically at bedtime.    Yes Historical Provider, MD  Travoprost, BAK Free, (TRAVATAN) 0.004 % SOLN ophthalmic solution Place 1 drop into both eyes at bedtime.   Yes Historical Provider, MD  amLODipine (NORVASC) 10 MG tablet Take 1 tablet (10 mg total) by mouth daily. Patient not taking: Reported on 11/08/2015 11/07/15   Richarda Overlie, MD  levETIRAcetam (KEPPRA) 1000 MG tablet Take 1 tablet (1,000 mg total) by mouth 2 (two) times daily. Patient not taking: Reported on 11/08/2015 11/07/15   Richarda Overlie, MD  lisinopril  (PRINIVIL,ZESTRIL) 20 MG tablet Take 1 tablet (20 mg total) by mouth daily. Patient not taking: Reported on 11/08/2015 11/07/15   Richarda Overlie, MD  pantoprazole (PROTONIX) 40 MG tablet Take 1 tablet (40 mg total) by mouth daily. Patient not taking: Reported on 11/08/2015 11/07/15   Richarda Overlie, MD   BP 126/74 mmHg  Pulse 66  Temp(Src) 98.2 F (36.8 C) (Oral)  Resp 18  SpO2 99% Physical Exam  Constitutional: She is oriented to person, place, and time. She appears well-developed and well-nourished. No distress.  HENT:  Head: Normocephalic and atraumatic.  Eyes: EOM are normal. Pupils are equal, round, and reactive to light.  Neck: Normal range of motion. Neck supple.  Cardiovascular: Normal rate and regular rhythm.  Exam reveals no gallop and no friction rub.   No murmur heard. Pulmonary/Chest: Effort normal. She has no wheezes. She has no rales.  Abdominal: Soft. She exhibits no distension. There is no tenderness.  Musculoskeletal: She exhibits no edema or tenderness.  Neurological:  She is alert and oriented to person, place, and time.  Skin: Skin is warm and dry. She is not diaphoretic.  Psychiatric: She has a normal mood and affect. Her behavior is normal.  Nursing note and vitals reviewed.   ED Course  Procedures (including critical care time) Labs Review Labs Reviewed - No data to display  Imaging Review Dg Shoulder Right  12/14/2015  CLINICAL DATA:  Pain following fall EXAM: RIGHT SHOULDER - 2+ VIEW COMPARISON:  June 01, 2015 FINDINGS: Frontal and Y scapular views were obtained. There is a comminuted fracture of the proximal humerus with avulsions arising from of the greater tuberosity region as well as a complete fracture through the proximal humeral metaphysis. There is medial displacement and lateral angulation of the humeral shaft with respect to the humeral head. The humeral head is rotated within the joint. There is no gross dislocation. There is no appreciable joint space  narrowing. No erosive change. IMPRESSION: Comminuted fracture proximal humerus with displaced humeral head fracture fragments as well as angulation and displacement at the humeral metaphysis proximally. No gross dislocation. Electronically Signed   By: Bretta BangWilliam  Woodruff III M.D.   On: 12/14/2015 13:53   Ct Head Wo Contrast  12/14/2015  CLINICAL DATA:  The patient fell out of a chair today and hit her head and right shoulder. EXAM: CT HEAD WITHOUT CONTRAST TECHNIQUE: Contiguous axial images were obtained from the base of the skull through the vertex without intravenous contrast. COMPARISON:  11/04/2015. FINDINGS: Diffusely enlarged ventricles and subarachnoid spaces. Patchy white matter low density in both cerebral hemispheres. No skull fracture, intracranial hemorrhage or paranasal sinus air-fluid levels. IMPRESSION: 1. No acute abnormality. 2. Moderate atrophy and mild to moderate chronic small vessel white matter ischemic changes in both cerebral hemispheres. Electronically Signed   By: Beckie SaltsSteven  Reid M.D.   On: 12/14/2015 15:23   I have personally reviewed and evaluated these images and lab results as part of my medical decision-making.   EKG Interpretation None      MDM   Final diagnoses:  Fracture of right humerus, with malunion, subsequent encounter    60 yo F with a chief complaint of a fall. CT the patient had an obtain a plain film of the right shoulder. Patient has full range motion of the neck without pain.  CT head negative. X-ray of the right humerus looks worse than prior as you by me. Discussed with [name], orthopedics. With her chronic medical issues and her severe dementia she still is a non-operative management. Patient is a sling follow up with [name] as an outpatient.  5:32 PM:  I have discussed the diagnosis/risks/treatment options with the patient and believe the pt to be eligible for discharge home to follow-up with Ortho. We also discussed returning to the ED  immediately if new or worsening sx occur. We discussed the sx which are most concerning (e.g., sudden worsening pain, fever, inability to tolerate by mouth) that necessitate immediate return. Medications administered to the patient during their visit and any new prescriptions provided to the patient are listed below.  Medications given during this visit Medications  acetaminophen (TYLENOL) tablet 1,000 mg (1,000 mg Oral Given 12/14/15 1349)    Discharge Medication List as of 12/14/2015  3:42 PM      The patient appears reasonably screen and/or stabilized for discharge and I doubt any other medical condition or other [facility] requiring further screening, evaluation, or treatment in the ED at this time prior to discharge.  Melene Plan, DO 12/14/15 1732

## 2015-12-14 NOTE — ED Notes (Signed)
Bed: WA24 Expected date:  Expected time:  Means of arrival:  Comments: EMS-fall 

## 2015-12-14 NOTE — ED Notes (Signed)
[facility] fall today out of chair. Facilitly reports patient hitting head and right shoulder. Previous fracture to shoulder. Hx Alzheimer's disease.

## 2015-12-14 NOTE — ED Notes (Signed)
[facility] called  

## 2015-12-29 ENCOUNTER — Ambulatory Visit (INDEPENDENT_AMBULATORY_CARE_PROVIDER_SITE_OTHER): Payer: Medicare HMO | Admitting: Neurology

## 2015-12-29 ENCOUNTER — Encounter: Payer: Self-pay | Admitting: Neurology

## 2015-12-29 VITALS — BP 121/67 | HR 66 | Ht 68.0 in | Wt 149.0 lb

## 2015-12-29 DIAGNOSIS — G300 Alzheimer's disease with early onset: Secondary | ICD-10-CM

## 2015-12-29 DIAGNOSIS — F0280 Dementia in other diseases classified elsewhere without behavioral disturbance: Secondary | ICD-10-CM | POA: Diagnosis not present

## 2015-12-29 DIAGNOSIS — R569 Unspecified convulsions: Secondary | ICD-10-CM

## 2015-12-29 MED ORDER — LEVETIRACETAM 1000 MG PO TABS: 1000.0000 mg | ORAL_TABLET | Freq: Two times a day (BID) | ORAL | 5 refills | Status: DC

## 2015-12-29 NOTE — Progress Notes (Signed)
Reason for visit: Seizures, dementia  Alexandria Lowe is an 60 y.o. female  History of present illness:  Ms. Alexandria Lowe is a 60 year old right-handed black female with a history of end-stage Alzheimer's disease, and a history of seizures. The patient has had a recent seizure on 11/04/2015 resulting in a fracture of the right humerus. The patient was felt not to be a surgical candidate given her dementia. The patient was increased on the Keppra taking 1000 mg twice daily. The patient however has returned to the extended care facility but never increased the dose of the medication, she is still taking 750 mg twice daily. The patient has not had any further seizures, she has fallen on occasion however. She will stiffen and have generalized jerking with her seizures. She returns for an evaluation. She is not having significant problems with daytime drowsiness, agitation, and she seems to sleep fairly well at night. The patient comes to the office with a caretaker.  Past Medical History:  Diagnosis Date  . Alzheimer's disease 06/17/2014  . Convulsions/seizures (HCC) 06/17/2014  . Depression   . Hypertension   . Nonverbal    "for awhile now"/sister (06/01/2015)    Past Surgical History:  Procedure Laterality Date  . DILATION AND CURETTAGE OF UTERUS    . VAGINAL HYSTERECTOMY      Family History  Problem Relation Age of Onset  . Cancer Mother     lung  . Hypertension Sister   . Thyroid disease Sister   . Hypertension Brother   . Diabetes Brother     Social history:  reports that she has never smoked. She has never used smokeless tobacco. She reports that she does not drink alcohol or use drugs.   No Known Allergies  Medications:  Prior to Admission medications   Medication Sig Start Date End Date Taking? Authorizing Provider  acetaminophen (TYLENOL) 500 MG tablet Take 500 mg by mouth every 4 (four) hours as needed for mild pain, moderate pain, fever or headache.    Yes Historical Provider,  MD  alum & mag hydroxide-simeth (MINTOX) 200-200-20 MG/5ML suspension Take 30 mLs by mouth every 6 (six) hours as needed for indigestion or heartburn.   Yes Historical Provider, MD  brimonidine (ALPHAGAN P) 0.1 % SOLN Place 1 drop into both eyes 2 (two) times daily.   Yes Historical Provider, MD  brinzolamide (AZOPT) 1 % ophthalmic suspension Place 1 drop into both eyes 2 (two) times daily.   Yes Historical Provider, MD  Calcium Carbonate-Vitamin D (CALCIUM-D) 600-400 MG-UNIT TABS Take 1 tablet by mouth daily with breakfast.    Yes Historical Provider, MD  guaiFENesin (ROBITUSSIN) 100 MG/5ML liquid Take 200 mg by mouth every 6 (six) hours as needed for cough.   Yes Historical Provider, MD  HYDROcodone-acetaminophen (NORCO/VICODIN) 5-325 MG tablet Take 1 tablet by mouth 3 (three) times daily.   Yes Historical Provider, MD  levETIRAcetam (KEPPRA) 750 MG tablet Take 750 mg by mouth 2 (two) times daily.   Yes Historical Provider, MD  lisinopril-hydrochlorothiazide (PRINZIDE,ZESTORETIC) 20-12.5 MG tablet Take 1 tablet by mouth daily with breakfast.    Yes Historical Provider, MD  loperamide (IMODIUM) 2 MG capsule Take 2 mg by mouth as needed for diarrhea or loose stools.   Yes Historical Provider, MD  loratadine (CLARITIN) 10 MG tablet Take 10 mg by mouth daily with breakfast.    Yes Historical Provider, MD  LORazepam (ATIVAN) 0.5 MG tablet Take 0.5 mg by mouth every 8 (eight) hours  as needed for anxiety.    Yes Historical Provider, MD  magnesium hydroxide (MILK OF MAGNESIA) 400 MG/5ML suspension Take 30 mLs by mouth at bedtime as needed for mild constipation.   Yes Historical Provider, MD  mirtazapine (REMERON) 30 MG tablet Take 30 mg by mouth at bedtime.   Yes Historical Provider, MD  mometasone (NASONEX) 50 MCG/ACT nasal spray Place 2 sprays into the nose daily.   Yes Historical Provider, MD  Multiple Vitamins-Minerals (PRESERVISION AREDS PO) Take 1 tablet by mouth 2 (two) times daily with a meal.     Yes Historical Provider, MD  neomycin-bacitracin-polymyxin (NEOSPORIN) [phone] ointment Apply 1 application topically as needed (for wound care).    Yes Historical Provider, MD  sertraline (ZOLOFT) 50 MG tablet Take 1 tablet (50 mg total) by mouth daily. Patient taking differently: Take 75 mg by mouth daily with breakfast.  03/06/13  Yes Levert Feinstein, MD  simvastatin (ZOCOR) 10 MG tablet Take 10 mg by mouth at bedtime.   Yes Historical Provider, MD  Skin Protectants, Misc. (MINERIN) CREA Apply 1 application topically at bedtime.    Yes Historical Provider, MD  Travoprost, BAK Free, (TRAVATAN) 0.004 % SOLN ophthalmic solution Place 1 drop into both eyes at bedtime.   Yes Historical Provider, MD    ROS:  Out of a complete 14 system review of symptoms, the patient complains only of the following symptoms, and all other reviewed systems are negative.  Seizures Memory disorder  Blood pressure 121/67, pulse 66, height  (1.727 m), weight 149 lb (67.6 kg).  Physical Exam  General: The patient is alert and cooperative at the time of the examination.  Skin: No significant peripheral edema is noted.   Neurologic Exam  Mental status: The patient is alert, but is minimally verbal. The patient will answer yes to most questions. The patient will not state her name. She will follow some commands.   Cranial nerves: Facial symmetry is present. Speech is very limited. Extraocular movements are full. Visual fields are full to threat.  Motor: The patient has good strength in all 4 extremities.  Sensory examination: Pain sensation is symmetric on the face, arms, and legs.  Coordination: The patient is not able to cooperate well for cerebellar testing.  Gait and station: The patient has a normal gait. The patient is able to ambulate without assistance.  Reflexes: Deep tendon reflexes are symmetric.   Assessment/Plan:  1. Alzheimer's disease  2. Seizures  The patient will go up on the  Keppra taking 1000 mg twice daily. If she has issues with drowsiness or agitation, they are to contact our office. The patient will follow-up otherwise in 6 months, sooner if needed. The patient has gone off of Aricept secondary to recurrent seizures. A prescription for the Keppra was written today.  Marlan Palau MD 12/29/2015 1:50 PM  [facility] [address] [place], [place] [address]  Phone [phone] Fax [phone]

## 2015-12-29 NOTE — Patient Instructions (Addendum)
Increase the Keppra dose to 1000 mg twice a day.   Epilepsy Epilepsy is a disorder in which a person has repeated seizures over time. A seizure is a release of abnormal electrical activity in the brain. Seizures can cause a change in attention, behavior, or the ability to remain awake and alert (altered mental status). Seizures often involve uncontrollable shaking (convulsions).  Most people with epilepsy lead normal lives. However, people with epilepsy are at an increased risk of falls, accidents, and injuries. Therefore, it is important to begin treatment right away. CAUSES  Epilepsy has many possible causes. Anything that disturbs the normal pattern of brain cell activity can lead to seizures. This may include:   Head injury.  Birth trauma.  High fever as a child.  Stroke.  Bleeding into or around the brain.  Certain drugs.  Prolonged low oxygen, such as what occurs after CPR efforts.  Abnormal brain development.  Certain illnesses, such as meningitis, encephalitis (brain infection), malaria, and other infections.  An imbalance of nerve signaling chemicals (neurotransmitters).  SIGNS AND SYMPTOMS  The symptoms of a seizure can vary greatly from one person to another. Right before a seizure, you may have a warning (aura) that a seizure is about to occur. An aura may include the following symptoms:  Fear or anxiety.  Nausea.  Feeling like the room is spinning (vertigo).  Vision changes, such as seeing flashing lights or spots. Common symptoms during a seizure include:  Abnormal sensations, such as an abnormal smell or a bitter taste in the mouth.   Sudden, general body stiffness.   Convulsions that involve rhythmic jerking of the face, arm, or leg on one or both sides.   Sudden change in consciousness.   Appearing to be awake but not responding.   Appearing to be asleep but cannot be awakened.   Grimacing, chewing, lip smacking, drooling, tongue biting,  or loss of bowel or bladder control. After a seizure, you may feel sleepy for a while. DIAGNOSIS  Your health care provider will ask about your symptoms and take a medical history. Descriptions from any witnesses to your seizures will be very helpful in the diagnosis. A physical exam, including a detailed neurological exam, is necessary. Various tests may be done, such as:   An electroencephalogram (EEG). This is a painless test of your brain waves. In this test, a diagram is created of your brain waves. These diagrams can be interpreted by a specialist.  An MRI of the brain.   A CT scan of the brain.   A spinal tap (lumbar puncture, LP).  Blood tests to check for signs of infection or abnormal blood chemistry. TREATMENT  There is no cure for epilepsy, but it is generally treatable. Once epilepsy is diagnosed, it is important to begin treatment as soon as possible. For most people with epilepsy, seizures can be controlled with medicines. The following may also be used:  A pacemaker for the brain (vagus nerve stimulator) can be used for people with seizures that are not well controlled by medicine.  Surgery on the brain. For some people, epilepsy eventually goes away. HOME CARE INSTRUCTIONS   Follow your health care provider's recommendations on driving and safety in normal activities.  Get enough rest. Lack of sleep can cause seizures.  Only take over-the-counter or prescription medicines as directed by your health care provider. Take any prescribed medicine exactly as directed.  Avoid any known triggers of your seizures.  Keep a seizure diary. Record  what you recall about any seizure, especially any possible trigger.   Make sure the people you live and work with know that you are prone to seizures. They should receive instructions on how to help you. In general, a witness to a seizure should:   Cushion your head and body.   Turn you on your side.   Avoid unnecessarily  restraining you.   Not place anything inside your mouth.   Call for emergency medical help if there is any question about what has occurred.   Follow up with your health care provider as directed. You may need regular blood tests to monitor the levels of your medicine.  SEEK MEDICAL CARE IF:   You develop signs of infection or other illness. This might increase the risk of a seizure.   You seem to be having more frequent seizures.   Your seizure pattern is changing.  SEEK IMMEDIATE MEDICAL CARE IF:   You have a seizure that does not stop after a few moments.   You have a seizure that causes any difficulty in breathing.   You have a seizure that results in a very severe headache.   You have a seizure that leaves you with the inability to speak or use a part of your body.    This information is not intended to replace advice given to you by your health care provider. Make sure you discuss any questions you have with your health care provider.   Document Released: 05/14/2005 Document Revised: 03/04/2013 Document Reviewed: 12/24/2012 Elsevier Interactive Patient Education [employer]

## 2016-01-05 ENCOUNTER — Ambulatory Visit: Payer: Medicare HMO | Admitting: Adult Health

## 2016-01-27 ENCOUNTER — Encounter (HOSPITAL_COMMUNITY): Payer: Self-pay | Admitting: Emergency Medicine

## 2016-01-27 ENCOUNTER — Emergency Department (HOSPITAL_COMMUNITY)
Admission: EM | Admit: 2016-01-27 | Discharge: 2016-01-27 | Disposition: A | Discharge status: Home / Self Care | Payer: Medicare HMO | Attending: Emergency Medicine | Admitting: Emergency Medicine

## 2016-01-27 DIAGNOSIS — G309 Alzheimer's disease, unspecified: Secondary | ICD-10-CM | POA: Insufficient documentation

## 2016-01-27 DIAGNOSIS — I10 Essential (primary) hypertension: Secondary | ICD-10-CM | POA: Diagnosis not present

## 2016-01-27 DIAGNOSIS — Z79899 Other long term (current) drug therapy: Secondary | ICD-10-CM | POA: Insufficient documentation

## 2016-01-27 DIAGNOSIS — R251 Tremor, unspecified: Secondary | ICD-10-CM | POA: Insufficient documentation

## 2016-01-27 NOTE — Discharge Instructions (Signed)
You have been seen today for tremors. Follow-up with your PCP to address any tremors. These tremors may be due to a progression of the dementia. There were no tremors or loss of balance witnessed here in the ED.

## 2016-01-27 NOTE — ED Notes (Signed)
[facility] made aware of transport needed back to [facility] 

## 2016-01-27 NOTE — ED Provider Notes (Signed)
[facility]-EMERGENCY DEPT Provider Note   CSN: [id number] Arrival date & time: 01/27/16  1502     History   Chief Complaint Chief Complaint  Patient presents with  . Tremors    HPI [name] is a 60 y.o. female.  HPI   Level V caveat due to dementia.  Alexandria Lowe is a 60 y.o. female, with a history of End-stage Alzheimer's dementia, hypertension, and seizures, presenting to the ED with reported tremors and decrease in balance.     Past Medical History:  Diagnosis Date  . Alzheimer's disease 06/17/2014  . Convulsions/seizures (HCC) 06/17/2014  . Depression   . Hypertension   . Nonverbal    "for awhile now"/sister (06/01/2015)    Patient Active Problem List   Diagnosis Date Noted  . Altered mental state   . Fracture of right humerus 11/05/2015  . Anemia 11/05/2015  . Essential hypertension 11/05/2015  . Hyperlipidemia 11/05/2015  . "Walking corpse" syndrome   . Seizures (HCC) 11/04/2015  . Contusion   . Altered mental status 06/01/2015  . Seizure (HCC)   . Alzheimer's disease 06/17/2014  . Convulsions/seizures (HCC) 06/17/2014  . Hypertensive urgency 03/10/2013  . Hypokalemia 03/10/2013  . Pre-syncope 03/10/2013  . Dementia     Past Surgical History:  Procedure Laterality Date  . DILATION AND CURETTAGE OF UTERUS    . VAGINAL HYSTERECTOMY      OB History    No data available       Home Medications    Prior to Admission medications   Medication Sig Start Date End Date Taking? Authorizing Provider  acetaminophen (TYLENOL) 500 MG tablet Take 500 mg by mouth every 4 (four) hours as needed for mild pain, moderate pain, fever or headache.     Historical Provider, MD  alum & mag hydroxide-simeth (MINTOX) 200-200-20 MG/5ML suspension Take 30 mLs by mouth every 6 (six) hours as needed for indigestion or heartburn.    Historical Provider, MD  brimonidine (ALPHAGAN P) 0.1 % SOLN Place 1 drop into both eyes 2 (two) times daily.    Historical Provider, MD    brinzolamide (AZOPT) 1 % ophthalmic suspension Place 1 drop into both eyes 2 (two) times daily.    Historical Provider, MD  Calcium Carbonate-Vitamin D (CALCIUM-D) 600-400 MG-UNIT TABS Take 1 tablet by mouth daily with breakfast.     Historical Provider, MD  guaiFENesin (ROBITUSSIN) 100 MG/5ML liquid Take 200 mg by mouth every 6 (six) hours as needed for cough.    Historical Provider, MD  HYDROcodone-acetaminophen (NORCO/VICODIN) 5-325 MG tablet Take 1 tablet by mouth 3 (three) times daily.    Historical Provider, MD  levETIRAcetam (KEPPRA) 1000 MG tablet Take 1 tablet (1,000 mg total) by mouth 2 (two) times daily. 12/29/15   York Spaniel, MD  lisinopril-hydrochlorothiazide (PRINZIDE,ZESTORETIC) 20-12.5 MG tablet Take 1 tablet by mouth daily with breakfast.     Historical Provider, MD  loperamide (IMODIUM) 2 MG capsule Take 2 mg by mouth as needed for diarrhea or loose stools.    Historical Provider, MD  loratadine (CLARITIN) 10 MG tablet Take 10 mg by mouth daily with breakfast.     Historical Provider, MD  LORazepam (ATIVAN) 0.5 MG tablet Take 0.5 mg by mouth every 8 (eight) hours as needed for anxiety.     Historical Provider, MD  magnesium hydroxide (MILK OF MAGNESIA) 400 MG/5ML suspension Take 30 mLs by mouth at bedtime as needed for mild constipation.    Historical Provider, MD  mirtazapine (REMERON) 30 MG tablet Take 30 mg by mouth at bedtime.    Historical Provider, MD  mometasone (NASONEX) 50 MCG/ACT nasal spray Place 2 sprays into the nose daily.    Historical Provider, MD  Multiple Vitamins-Minerals (PRESERVISION AREDS PO) Take 1 tablet by mouth 2 (two) times daily with a meal.     Historical Provider, MD  neomycin-bacitracin-polymyxin (NEOSPORIN) [phone] ointment Apply 1 application topically as needed (for wound care).     Historical Provider, MD  sertraline (ZOLOFT) 50 MG tablet Take 1 tablet (50 mg total) by mouth daily. Patient taking differently: Take 75 mg by mouth daily with  breakfast.  03/06/13   Levert Feinstein, MD  simvastatin (ZOCOR) 10 MG tablet Take 10 mg by mouth at bedtime.    Historical Provider, MD  Skin Protectants, Misc. (MINERIN) CREA Apply 1 application topically at bedtime.     Historical Provider, MD  Travoprost, BAK Free, (TRAVATAN) 0.004 % SOLN ophthalmic solution Place 1 drop into both eyes at bedtime.    Historical Provider, MD    Family History Family History  Problem Relation Age of Onset  . Cancer Mother     lung  . Hypertension Sister   . Thyroid disease Sister   . Hypertension Brother   . Diabetes Brother     Social History Social History  Substance Use Topics  . Smoking status: Never Smoker  . Smokeless tobacco: Never Used  . Alcohol use No     Allergies   Review of patient's allergies indicates no known allergies.   Review of Systems Review of Systems  Unable to perform ROS: Dementia     Physical Exam Updated Vital Signs BP 120/62 (BP Location: Right Arm)   Pulse 67   Temp 97.6 F (36.4 C) (Oral)   Resp 15   SpO2 93%   Physical Exam  Constitutional: She appears well-developed and well-nourished. No distress.  HENT:  Head: Normocephalic and atraumatic.  Eyes: Conjunctivae are normal. Pupils are equal, round, and reactive to light.  EOMs noted to be intact, but patient would not follow commands for eye movement. EOM testing was accomplished by the provider moving around the room and the patient following accordingly.  Neck: Neck supple.  Cardiovascular: Normal rate, regular rhythm, normal heart sounds and intact distal pulses.   Pulmonary/Chest: Effort normal and breath sounds normal. No respiratory distress.  Abdominal: Soft. There is no tenderness. There is no guarding.  Musculoskeletal: She exhibits no edema or tenderness.  No swelling, tenderness, or other signs of injuries noted. Patient has motor function in all 4 extremities.  Lymphadenopathy:    She has no cervical adenopathy.  Neurological: She is  alert.  Patient is oriented only to self, which is her baseline. She will follow commands. Distal sensation is intact. Strength 5 out of 5 in all extremities. No tremors noted.  Skin: Skin is warm and dry. She is not diaphoretic.  Nursing note and vitals reviewed.    ED Treatments / Results  Labs (all labs ordered are listed, but only abnormal results are displayed) Labs Reviewed - No data to display  EKG  EKG Interpretation None       Radiology No results found.  Procedures Procedures (including critical care time)  Medications Ordered in ED Medications - No data to display   Initial Impression / Assessment and Plan / ED Course  I have reviewed the triage vital signs and the nursing notes.  Pertinent labs & imaging results that were  available during my care of the patient were reviewed by me and considered in my medical decision making (see chart for details).  Clinical Course    Arnoldo MoraleBonnie L Silvio presents with reported tremors and balance changes.  Findings and plan of care discussed with Rolland PorterMark James, MD.   3:25 PM Spoke with Aggie Cosierrystal, [facility], who states Patient was sent to the ED due to tremors in her arms and decreased balance. Patient is ambulatory and felt as though she was too weak to stand up. Patient did not fall. No recent falls, trauma, or illness. Patient has severe Alzheimer's dementia and will only say yes or no in response to questions, but is not reliable in her answers. There were no abnormalities on the patient's exam. No tremors were noted. Patient ambulated without difficulty. Patient discharged to her facility with instructions for PCP follow-up.  Final Clinical Impressions(s) / ED Diagnoses   Final diagnoses:  Tremor    New Prescriptions Discharge Medication List as of 01/27/2016  3:35 PM       Anselm PancoastShawn C Cort Dragoo, PA-C 01/27/16 2020    Rolland PorterMark James, MD 02/07/16 2348

## 2016-01-27 NOTE — ED Notes (Signed)
Bed: ZO10WA11 Expected date:  Expected time:  Means of arrival:  Comments: Tremors from facility-Hx of Dementia

## 2016-01-27 NOTE — ED Triage Notes (Signed)
[facility] and loss of balance without fall.  Patient has dementia and per her baseline per staff facility.  CBG 139, BP 131/55, HR 67, 95-100% on room air.

## 2016-02-27 ENCOUNTER — Encounter (HOSPITAL_COMMUNITY): Payer: Self-pay

## 2016-02-27 ENCOUNTER — Emergency Department (HOSPITAL_COMMUNITY)
Admission: EM | Admit: 2016-02-27 | Discharge: 2016-02-27 | Disposition: A | Discharge status: Home / Self Care | Payer: Medicare HMO | Attending: Emergency Medicine | Admitting: Emergency Medicine

## 2016-02-27 DIAGNOSIS — Y939 Activity, unspecified: Secondary | ICD-10-CM | POA: Insufficient documentation

## 2016-02-27 DIAGNOSIS — I10 Essential (primary) hypertension: Secondary | ICD-10-CM | POA: Diagnosis not present

## 2016-02-27 DIAGNOSIS — Z79899 Other long term (current) drug therapy: Secondary | ICD-10-CM | POA: Diagnosis not present

## 2016-02-27 DIAGNOSIS — Y999 Unspecified external cause status: Secondary | ICD-10-CM | POA: Diagnosis not present

## 2016-02-27 DIAGNOSIS — Y929 Unspecified place or not applicable: Secondary | ICD-10-CM | POA: Diagnosis not present

## 2016-02-27 DIAGNOSIS — F0390 Unspecified dementia without behavioral disturbance: Secondary | ICD-10-CM | POA: Insufficient documentation

## 2016-02-27 DIAGNOSIS — W19XXXA Unspecified fall, initial encounter: Secondary | ICD-10-CM | POA: Diagnosis not present

## 2016-02-27 NOTE — ED Triage Notes (Addendum)
[facility]; pt's roommate heard pt fall and staff found pt on floor; hx of dementia and seizures; upon initial assessment, pt was nonverbal and slow to follow commands; in ambulance pt returned to baseline with speech and following commands; no injuries noted; towel wrapped for spinal support

## 2016-02-27 NOTE — ED Notes (Signed)
Bed: ZO10WA16 Expected date:  Expected time:  Means of arrival:  Comments: 59yo F fall

## 2016-02-27 NOTE — ED Notes (Signed)
MD at bedside. 

## 2016-02-27 NOTE — ED Provider Notes (Signed)
[facility]-EMERGENCY DEPT Provider Note   CSN: [id number] Arrival date & time: 02/27/16  0708     History   Chief Complaint Chief Complaint  Patient presents with  . Fall    HPI Alexandria Lowe is a 60 y.o. female.  Patient with hx dementia, s/p fall at [facility]cf. Per report, had gotten up to bathroom, and another resident heard her fall.  No seizure activity noted. By EMS arrival, patients mental status was noted as consistent with baseline.  On arrival to ED patient remains alert and awake, smiling, and in no apparent distress. Patient w advanced dementia, limited historian - level 5 caveat.        The history is provided by the patient and the EMS personnel. The history is limited by the condition of the patient.  Fall     Past Medical History:  Diagnosis Date  . Alzheimer's disease 06/17/2014  . Convulsions/seizures (HCC) 06/17/2014  . Depression   . Hypertension   . Nonverbal    "for awhile now"/sister (06/01/2015)    Patient Active Problem List   Diagnosis Date Noted  . Altered mental state   . Fracture of right humerus 11/05/2015  . Anemia 11/05/2015  . Essential hypertension 11/05/2015  . Hyperlipidemia 11/05/2015  . "walking corpse" syndrome   . Seizures (HCC) 11/04/2015  . Contusion   . Altered mental status 06/01/2015  . Seizure (HCC)   . Alzheimer's disease 06/17/2014  . Convulsions/seizures (HCC) 06/17/2014  . Hypertensive urgency 03/10/2013  . Hypokalemia 03/10/2013  . Pre-syncope 03/10/2013  . Dementia     Past Surgical History:  Procedure Laterality Date  . DILATION AND CURETTAGE OF UTERUS    . VAGINAL HYSTERECTOMY      OB History    No data available       Home Medications    Prior to Admission medications   Medication Sig Start Date End Date Taking? Authorizing Provider  acetaminophen (TYLENOL) 500 MG tablet Take 500 mg by mouth every 4 (four) hours as needed for mild pain, moderate pain, fever or headache.     Historical Provider, MD  alum  & mag hydroxide-simeth (MINTOX) 200-200-20 MG/5ML suspension Take 30 mLs by mouth every 6 (six) hours as needed for indigestion or heartburn.    Historical Provider, MD  brimonidine (ALPHAGAN P) 0.1 % SOLN Place 1 drop into both eyes 2 (two) times daily.    Historical Provider, MD  brinzolamide (AZOPT) 1 % ophthalmic suspension Place 1 drop into both eyes 2 (two) times daily.    Historical Provider, MD  Calcium Carbonate-Vitamin D (CALCIUM-D) 600-400 MG-UNIT TABS Take 1 tablet by mouth daily with breakfast.     Historical Provider, MD  guaiFENesin (ROBITUSSIN) 100 MG/5ML liquid Take 200 mg by mouth every 6 (six) hours as needed for cough.    Historical Provider, MD  HYDROcodone-acetaminophen (NORCO/VICODIN) 5-325 MG tablet Take 1 tablet by mouth 3 (three) times daily.    Historical Provider, MD  levETIRAcetam (KEPPRA) 1000 MG tablet Take 1 tablet (1,000 mg total) by mouth 2 (two) times daily. 12/29/15   York Spaniel, MD  lisinopril-hydrochlorothiazide (PRINZIDE,ZESTORETIC) 20-12.5 MG tablet Take 1 tablet by mouth daily with breakfast.     Historical Provider, MD  loperamide (IMODIUM) 2 MG capsule Take 2 mg by mouth as needed for diarrhea or loose stools.    Historical Provider, MD  loratadine (CLARITIN) 10 MG tablet Take 10 mg by mouth daily with breakfast.     Historical Provider, MD  LORazepam (ATIVAN) 0.5 MG tablet Take 0.5 mg by mouth every 8 (eight) hours as needed for anxiety.     Historical Provider, MD  magnesium hydroxide (MILK OF MAGNESIA) 400 MG/5ML suspension Take 30 mLs by mouth at bedtime as needed for mild constipation.    Historical Provider, MD  mirtazapine (REMERON) 30 MG tablet Take 30 mg by mouth at bedtime.    Historical Provider, MD  mometasone (NASONEX) 50 MCG/ACT nasal spray Place 2 sprays into the nose daily.    Historical Provider, MD  Multiple Vitamins-Minerals (PRESERVISION AREDS PO) Take 1 tablet by mouth 2 (two) times daily with a meal.     Historical Provider, MD    neomycin-bacitracin-polymyxin (NEOSPORIN) [phone] ointment Apply 1 application topically as needed (for wound care).     Historical Provider, MD  sertraline (ZOLOFT) 50 MG tablet Take 1 tablet (50 mg total) by mouth daily. Patient taking differently: Take 75 mg by mouth daily with breakfast.  03/06/13   Levert Feinstein, MD  simvastatin (ZOCOR) 10 MG tablet Take 10 mg by mouth at bedtime.    Historical Provider, MD  Skin Protectants, Misc. (MINERIN) CREA Apply 1 application topically at bedtime.     Historical Provider, MD  Travoprost, BAK Free, (TRAVATAN) 0.004 % SOLN ophthalmic solution Place 1 drop into both eyes at bedtime.    Historical Provider, MD    Family History Family History  Problem Relation Age of Onset  . Cancer Mother     lung  . Hypertension Sister   . Thyroid disease Sister   . Hypertension Brother   . Diabetes Brother     Social History Social History  Substance Use Topics  . Smoking status: Never Smoker  . Smokeless tobacco: Never Used  . Alcohol use No     Allergies   Review of patient's allergies indicates no known allergies.   Review of Systems Review of Systems  Unable to perform ROS: Dementia  level 5 caveat   Physical Exam Updated Vital Signs BP 144/80 (BP Location: Left Arm)   Pulse 60   Temp 97.7 F (36.5 C) (Oral)   Resp 16   SpO2 100%   Physical Exam  Constitutional: She appears well-developed and well-nourished. No distress.  HENT:  Head: Atraumatic.  Mouth/Throat: Oropharynx is clear and moist.  No facial or scalp contusion, bruising, sts, or tenderness.  No oral or tongue trauma.   Eyes: Conjunctivae and EOM are normal. Pupils are equal, round, and reactive to light. No scleral icterus.  Neck: Normal range of motion. Neck supple. No tracheal deviation present. No thyromegaly present.  No stiffness or rigidity. No bruits.   Cardiovascular: Normal rate, regular rhythm, normal heart sounds and intact distal pulses.    Pulmonary/Chest: Effort normal and breath sounds normal. No respiratory distress. She exhibits no tenderness.  Abdominal: Soft. Normal appearance and bowel sounds are normal. She exhibits no distension. There is no tenderness.  Genitourinary:  Genitourinary Comments: No cva tenderness  Musculoskeletal: She exhibits no edema.  CTLS spine, non tender, aligned, no step off. Good rom bil extremities without pain or focal bony tenderness.   Neurological: She is alert.  Patient alert, content, smiling. Moves bil extremities purposefully w good strength. Patients mental status described as being c/w baseline.  Occasionally answers yes to questions.   Skin: Skin is warm and dry. No rash noted. She is not diaphoretic.  Psychiatric:  Alert, smiling, content.   Nursing note and vitals reviewed.    ED Treatments /  Results  Labs (all labs ordered are listed, but only abnormal results are displayed) Labs Reviewed - No data to display  EKG  EKG Interpretation None       Radiology No results found.  Procedures Procedures (including critical care time)  Medications Ordered in ED Medications - No data to display   Initial Impression / Assessment and Plan / ED Course  I have reviewed the triage vital signs and the nursing notes.  Pertinent labs & imaging results that were available during my care of the patient were reviewed by me and considered in my medical decision making (see chart for details).  Clinical Course    Patient described as having baseline dementia, and mental status c/w baseline.  No reported fevers.   Patient appears alert, content, smiling, and in no apparent pain or discomfort.   No focal injury or tenderness noted.  Po fluids.   Recheck remains alert, content appearing, at baseline, no seizure activity.   Patient observed, on additional recheck patient remains at baseline, and appears stable for d/c.      Final Clinical Impressions(s) / ED Diagnoses    Final diagnoses:  None    New Prescriptions New Prescriptions   No medications on file     Cathren LaineKevin Izekiel Flegel, MD 02/27/16 [id number]

## 2016-02-27 NOTE — Discharge Instructions (Signed)
It was our pleasure to provide your ER care today - we hope that you feel better.  Fall precautions.  Follow up with your primary care doctor in the coming week.  Return to ER if worse, new symptoms, fevers, recurrent falls, seizures, change in mental status, new or severe pain, other concern.

## 2016-02-27 NOTE — ED Notes (Signed)
Patient was attempting to exit the bed patient had to be redirected back into bed by 2 staff members.

## 2016-04-02 ENCOUNTER — Emergency Department (HOSPITAL_COMMUNITY)
Admission: EM | Admit: 2016-04-02 | Discharge: 2016-04-02 | Disposition: A | Discharge status: Home / Self Care | Payer: Medicare HMO | Attending: Emergency Medicine | Admitting: Emergency Medicine

## 2016-04-02 ENCOUNTER — Encounter (HOSPITAL_COMMUNITY): Payer: Self-pay | Admitting: Emergency Medicine

## 2016-04-02 DIAGNOSIS — R569 Unspecified convulsions: Secondary | ICD-10-CM

## 2016-04-02 DIAGNOSIS — Z79899 Other long term (current) drug therapy: Secondary | ICD-10-CM | POA: Diagnosis not present

## 2016-04-02 DIAGNOSIS — G309 Alzheimer's disease, unspecified: Secondary | ICD-10-CM | POA: Insufficient documentation

## 2016-04-02 DIAGNOSIS — I10 Essential (primary) hypertension: Secondary | ICD-10-CM | POA: Diagnosis not present

## 2016-04-02 HISTORY — DX: Hyperlipidemia, unspecified: E78.5

## 2016-04-02 LAB — URINALYSIS, ROUTINE W REFLEX MICROSCOPIC
Bilirubin Urine: NEGATIVE
GLUCOSE, UA: NEGATIVE mg/dL
Hgb urine dipstick: NEGATIVE
Ketones, ur: NEGATIVE mg/dL
LEUKOCYTES UA: NEGATIVE
Nitrite: NEGATIVE
PH: 6.5 (ref 5.0–8.0)
PROTEIN: NEGATIVE mg/dL
Specific Gravity, Urine: 1.028 (ref 1.005–1.030)

## 2016-04-02 LAB — COMPREHENSIVE METABOLIC PANEL
ALBUMIN: 3.8 g/dL (ref 3.5–5.0)
ALT: 14 U/L (ref 14–54)
ANION GAP: 6 (ref 5–15)
AST: 20 U/L (ref 15–41)
Alkaline Phosphatase: 52 U/L (ref 38–126)
BUN: 17 mg/dL (ref 6–20)
CHLORIDE: 111 mmol/L (ref 101–111)
CO2: 27 mmol/L (ref 22–32)
Calcium: 9.2 mg/dL (ref 8.9–10.3)
Creatinine, Ser: 0.92 mg/dL (ref 0.44–1.00)
GFR calc Af Amer: >60 mL/min (ref >60)
GFR calc non Af Amer: >60 mL/min (ref >60)
GLUCOSE: 97 mg/dL (ref 65–99)
POTASSIUM: 3.5 mmol/L (ref 3.5–5.1)
SODIUM: 144 mmol/L (ref 135–145)
Total Bilirubin: 0.8 mg/dL (ref 0.3–1.2)
Total Protein: 6.7 g/dL (ref 6.5–8.1)

## 2016-04-02 LAB — CBC WITH DIFFERENTIAL/PLATELET
BASOS PCT: 0 %
Basophils Absolute: 0 10*3/uL (ref 0.0–0.1)
EOS ABS: 0.1 10*3/uL (ref 0.0–0.7)
Eosinophils Relative: 1 %
HCT: 32 % — ABNORMAL LOW (ref 36.0–46.0)
HEMOGLOBIN: 10.3 g/dL — AB (ref 12.0–15.0)
Lymphocytes Relative: 23 %
Lymphs Abs: 1.5 10*3/uL (ref 0.7–4.0)
MCH: 25.4 pg — ABNORMAL LOW (ref 26.0–34.0)
MCHC: 32.2 g/dL (ref 30.0–36.0)
MCV: 79 fL (ref 78.0–100.0)
MONO ABS: 0.4 10*3/uL (ref 0.1–1.0)
MONOS PCT: 6 %
NEUTROS PCT: 70 %
Neutro Abs: 4.7 10*3/uL (ref 1.7–7.7)
Platelets: 241 10*3/uL (ref 150–400)
RBC: 4.05 MIL/uL (ref 3.87–5.11)
RDW: 17.6 % — AB (ref 11.5–15.5)
WBC: 6.6 10*3/uL (ref 4.0–10.5)

## 2016-04-02 LAB — CBG MONITORING, ED: Glucose-Capillary: 84 mg/dL (ref 65–99)

## 2016-04-02 NOTE — ED Notes (Signed)
Raynelle FanningJulie NT attempted in and out cath twice and was unsuccessful.

## 2016-04-02 NOTE — Discharge Instructions (Signed)
Lab work and urine analysis normal. Continue keppra. Follow up with neurology. Return if any issues.

## 2016-04-02 NOTE — ED Notes (Signed)
[facility] called for transport back to facility 

## 2016-04-02 NOTE — ED Provider Notes (Signed)
[facility]-EMERGENCY DEPT Provider Note   CSN: [id number] Arrival date & time: 04/02/16  0620     History   Chief Complaint Chief Complaint  Patient presents with  . Seizures    HPI Alexandria Lowe is a 60 y.o. female.  HPI Alexandria Lowe is a 60 y.o. female with history of Alzheimer's disease, seizures, depression, hypertension, nonverbal, presents to emergency department after an episode of seizure. I spoke with the nursing home, patient apparently was seen actively having a seizure and was lowered to the ground. Patient sustained no injuries. Patient had tonic-clonic shaking. No loss of bladder control. No tongue injury. Patient initially confused by EMS, but appears to be back at baseline when leaving the nursing home. Patient has not had a seizure in several months. Her Keppra dose was increased and dry.  Past Medical History:  Diagnosis Date  . Alzheimer's disease 06/17/2014  . Convulsions/seizures (HCC) 06/17/2014  . Depression   . Hyperlipidemia   . Hypertension   . Nonverbal    "for awhile now"/sister (06/01/2015)    Patient Active Problem List   Diagnosis Date Noted  . Altered mental state   . Fracture of right humerus 11/05/2015  . Anemia 11/05/2015  . Essential hypertension 11/05/2015  . Hyperlipidemia 11/05/2015  . "walking corpse" syndrome   . Seizures (HCC) 11/04/2015  . Contusion   . Altered mental status 06/01/2015  . Seizure (HCC)   . Alzheimer's disease 06/17/2014  . Convulsions/seizures (HCC) 06/17/2014  . Hypertensive urgency 03/10/2013  . Hypokalemia 03/10/2013  . Pre-syncope 03/10/2013  . Dementia     Past Surgical History:  Procedure Laterality Date  . DILATION AND CURETTAGE OF UTERUS    . VAGINAL HYSTERECTOMY      OB History    No data available       Home Medications    Prior to Admission medications   Medication Sig Start Date End Date Taking? Authorizing Provider  acetaminophen (TYLENOL) 500 MG tablet Take 500 mg by mouth every 4  (four) hours as needed for mild pain, moderate pain, fever or headache.     Historical Provider, MD  alum & mag hydroxide-simeth (MINTOX) 200-200-20 MG/5ML suspension Take 30 mLs by mouth as needed for indigestion or heartburn.     Historical Provider, MD  brimonidine (ALPHAGAN P) 0.1 % SOLN Place 1 drop into both eyes 2 (two) times daily.    Historical Provider, MD  brinzolamide (AZOPT) 1 % ophthalmic suspension Place 1 drop into both eyes 2 (two) times daily.    Historical Provider, MD  Calcium Carbonate-Vitamin D (CALCIUM-D) 600-400 MG-UNIT TABS Take 1 tablet by mouth daily with breakfast.     Historical Provider, MD  guaiFENesin (ROBITUSSIN) 100 MG/5ML liquid Take 200 mg by mouth every 6 (six) hours as needed for cough.    Historical Provider, MD  HYDROcodone-acetaminophen (NORCO/VICODIN) 5-325 MG tablet Take 1 tablet by mouth 2 (two) times daily.     Historical Provider, MD  levETIRAcetam (KEPPRA) 1000 MG tablet Take 1 tablet (1,000 mg total) by mouth 2 (two) times daily. 12/29/15   York Spanielharles K Willis, MD  lisinopril-hydrochlorothiazide (PRINZIDE,ZESTORETIC) 20-12.5 MG tablet Take 1 tablet by mouth daily with breakfast.     Historical Provider, MD  loperamide (IMODIUM) 2 MG capsule Take 2 mg by mouth as needed for diarrhea or loose stools.    Historical Provider, MD  loratadine (CLARITIN) 10 MG tablet Take 10 mg by mouth daily with breakfast.     Historical Provider,  MD  LORazepam (ATIVAN) 0.5 MG tablet Take 0.25 mg by mouth at bedtime.     Historical Provider, MD  LORazepam (ATIVAN) 0.5 MG tablet Take 0.5 mg by mouth every 8 (eight) hours as needed for anxiety.    Historical Provider, MD  magnesium hydroxide (MILK OF MAGNESIA) 400 MG/5ML suspension Take 30 mLs by mouth at bedtime as needed for mild constipation.    Historical Provider, MD  mirtazapine (REMERON) 30 MG tablet Take 30 mg by mouth at bedtime.    Historical Provider, MD  mometasone (NASONEX) 50 MCG/ACT nasal spray Place 2 sprays into the  nose daily after breakfast.     Historical Provider, MD  Multiple Vitamins-Minerals (OCUVITE ADULT 50+) CAPS Take 1 capsule by mouth daily with breakfast.    Historical Provider, MD  Multiple Vitamins-Minerals (PRESERVISION AREDS PO) Take 1 tablet by mouth 2 (two) times daily with a meal.     Historical Provider, MD  neomycin-bacitracin-polymyxin (NEOSPORIN) [phone] ointment Apply 1 application topically as needed (for wound care).     Historical Provider, MD  sertraline (ZOLOFT) 50 MG tablet Take 1 tablet (50 mg total) by mouth daily. Patient taking differently: Take 75 mg by mouth daily with breakfast.  03/06/13   Levert FeinsteinYijun Yan, MD  simvastatin (ZOCOR) 10 MG tablet Take 10 mg by mouth at bedtime.    Historical Provider, MD  Skin Protectants, Misc. (MINERIN) CREA Apply 1 application topically at bedtime. Applies to whole body    Historical Provider, MD  Travoprost, BAK Free, (TRAVATAN Z) 0.004 % SOLN ophthalmic solution Place 1 drop into both eyes at bedtime.    Historical Provider, MD    Family History Family History  Problem Relation Age of Onset  . Cancer Mother     lung  . Hypertension Sister   . Thyroid disease Sister   . Hypertension Brother   . Diabetes Brother     Social History Social History  Substance Use Topics  . Smoking status: Never Smoker  . Smokeless tobacco: Never Used  . Alcohol use No     Allergies   Patient has no known allergies.   Review of Systems Review of Systems  Unable to perform ROS: Dementia  Neurological: Positive for seizures.     Physical Exam Updated Vital Signs BP (!) 145/110 (BP Location: Right Arm)   Pulse 77   Temp 98.2 F (36.8 C) (Oral)   Resp 18   SpO2 99%   Physical Exam  Constitutional: She appears well-developed and well-nourished. No distress.  HENT:  Head: Normocephalic.  Eyes: Conjunctivae and EOM are normal.  Neck: Neck supple.  Cardiovascular: Normal rate, regular rhythm and normal heart sounds.     Pulmonary/Chest: Effort normal and breath sounds normal. No respiratory distress. She has no wheezes. She has no rales.  Abdominal: Soft. Bowel sounds are normal. She exhibits no distension. There is no tenderness. There is no rebound.  Musculoskeletal: She exhibits no edema.  Neurological: She is alert.  Non verbal. Follows simple commands. Grip is 5/5 and equla bilaterally. Able to move bilateral LE.   Skin: Skin is warm and dry.  Psychiatric: She has a normal mood and affect. Her behavior is normal.  Nursing note and vitals reviewed.    ED Treatments / Results  Labs (all labs ordered are listed, but only abnormal results are displayed) Labs Reviewed  CBC WITH DIFFERENTIAL/PLATELET  COMPREHENSIVE METABOLIC PANEL  URINALYSIS, ROUTINE W REFLEX MICROSCOPIC (NOT [facility])  CBG MONITORING, ED  EKG  EKG Interpretation None       Radiology No results found.  Procedures Procedures (including critical care time)  Medications Ordered in ED Medications - No data to display   Initial Impression / Assessment and Plan / ED Course  I have reviewed the triage vital signs and the nursing notes.  Pertinent labs & imaging results that were available during my care of the patient were reviewed by me and considered in my medical decision making (see chart for details).  Clinical Course    Pt with episode of seizure. Pt is at baseline. Last seizure in July at that time increased her keppra dose. Will check labs, UA, monitor.   11:30 AM Labs, UA all negative. Pt with no more seizure episodes. At baseline. No obvious neuro deficits. No head trauma. Plan to dc home with close outpatient follow up with neurology. discussed with Dr. Clarene Duke, agrees to the plan.   Vitals:   04/02/16 0843 04/02/16 0900 04/02/16 0905 04/02/16 1000  BP: 149/74 146/73  147/69  Pulse: 65 63 68 68  Resp: 15 14 16 18   Temp:      TempSrc:      SpO2: 99% 100% 100% 100%     Final Clinical Impressions(s) /  ED Diagnoses   Final diagnoses:  Seizure [facility])    New Prescriptions New Prescriptions   No medications on file     Jaynie Crumble, PA-C 04/02/16 1132    Laurence Spates, MD 04/02/16 1547

## 2016-04-02 NOTE — ED Notes (Signed)
No respiratory or acute distress noted alert and talking call light in reach seizure precautions in effect no seizure activity noted.

## 2016-04-02 NOTE — ED Triage Notes (Signed)
[facility] after staff reported having witnessed pt having a seizure that lasted 10 minutes    Pt has hx of seizures  EMS staff reports pt is nonverbal and is back to her baseline

## 2016-05-02 ENCOUNTER — Emergency Department (HOSPITAL_COMMUNITY): Payer: Medicare HMO

## 2016-05-02 ENCOUNTER — Emergency Department (HOSPITAL_COMMUNITY)
Admission: EM | Admit: 2016-05-02 | Discharge: 2016-05-03 | Disposition: A | Discharge status: Home / Self Care | Payer: Medicare HMO | Attending: Emergency Medicine | Admitting: Emergency Medicine

## 2016-05-02 ENCOUNTER — Encounter (HOSPITAL_COMMUNITY): Payer: Self-pay

## 2016-05-02 DIAGNOSIS — Y929 Unspecified place or not applicable: Secondary | ICD-10-CM | POA: Insufficient documentation

## 2016-05-02 DIAGNOSIS — Y999 Unspecified external cause status: Secondary | ICD-10-CM | POA: Diagnosis not present

## 2016-05-02 DIAGNOSIS — W19XXXA Unspecified fall, initial encounter: Secondary | ICD-10-CM | POA: Diagnosis not present

## 2016-05-02 DIAGNOSIS — S40211A Abrasion of right shoulder, initial encounter: Secondary | ICD-10-CM

## 2016-05-02 DIAGNOSIS — I10 Essential (primary) hypertension: Secondary | ICD-10-CM | POA: Insufficient documentation

## 2016-05-02 DIAGNOSIS — Z79899 Other long term (current) drug therapy: Secondary | ICD-10-CM | POA: Insufficient documentation

## 2016-05-02 DIAGNOSIS — G309 Alzheimer's disease, unspecified: Secondary | ICD-10-CM | POA: Diagnosis not present

## 2016-05-02 DIAGNOSIS — Y939 Activity, unspecified: Secondary | ICD-10-CM | POA: Diagnosis not present

## 2016-05-02 DIAGNOSIS — S4991XA Unspecified injury of right shoulder and upper arm, initial encounter: Secondary | ICD-10-CM | POA: Diagnosis present

## 2016-05-02 LAB — URINALYSIS, ROUTINE W REFLEX MICROSCOPIC
BILIRUBIN URINE: NEGATIVE
Glucose, UA: NEGATIVE mg/dL
HGB URINE DIPSTICK: NEGATIVE
KETONES UR: NEGATIVE mg/dL
Leukocytes, UA: NEGATIVE
NITRITE: NEGATIVE
Protein, ur: NEGATIVE mg/dL
SPECIFIC GRAVITY, URINE: 1.02 (ref 1.005–1.030)
pH: 5 (ref 5.0–8.0)

## 2016-05-02 NOTE — ED Notes (Signed)
Bed: [id number] Expected date:  Expected time:  Means of arrival:  Comments:  female fall at SNF, hx of dementia

## 2016-05-02 NOTE — ED Triage Notes (Signed)
[facility]. Per EMS, facility stated that pt fell forward from a standing position. They deny LOC or blood thinner use.  No deformities noted with EMS, but they did state that pt cried out in pain when they tried to turn her. Pt has a hx of dementia, alzheimers, and stroke. Pt is not verbal at baseline.

## 2016-05-02 NOTE — ED Provider Notes (Signed)
[facility]-EMERGENCY DEPT Provider Note   CSN: [id number] Arrival date & time: 05/02/16  2037  By signing my name below, I, Octavia Heir, attest that this documentation has been prepared under the direction and in the presence of [employer], PA-C.  Electronically Signed: Octavia Heir, ED Scribe. 05/02/16. 9:07 PM.   History   Chief Complaint Chief Complaint  Patient presents with  . Fall   The history is provided by the nursing home and the EMS personnel. The history is limited by the condition of the patient. No language interpreter was used.    LEVEL 5 CAVEAT SECONDARY TO DEMENTIA  HPI Comments: Alexandria Lowe is a 60 y.o. female brought in by ambulance, who has a PMhx of alzheimer's disease, seizures, HLD, HTN, and hypokalemia presents to the Emergency Department s/p a fall that occurred PTA. Pt is nonverbal at baseline. Pt is a resident at [facility]. Per EMS, the facility called out saying that the pt fell forward from a standing position. EMS says that pt cried out in pain when they tried to turn her. She points to pain at her right shoulder. Facility states pt did not lose consciousness. She is not on any anticoagulant. C-collar in place.   Past Medical History:  Diagnosis Date  . Alzheimer's disease 06/17/2014  . Convulsions/seizures (HCC) 06/17/2014  . Depression   . Hyperlipidemia   . Hypertension   . Nonverbal    "for awhile now"/sister (06/01/2015)    Patient Active Problem List   Diagnosis Date Noted  . Altered mental state   . Fracture of right humerus 11/05/2015  . Anemia 11/05/2015  . Essential hypertension 11/05/2015  . Hyperlipidemia 11/05/2015  . "walking corpse" syndrome   . Seizures (HCC) 11/04/2015  . Contusion   . Altered mental status 06/01/2015  . Seizure (HCC)   . Alzheimer's disease 06/17/2014  . Convulsions/seizures (HCC) 06/17/2014  . Hypertensive urgency 03/10/2013  . Hypokalemia 03/10/2013  . Pre-syncope 03/10/2013  . Dementia      Past Surgical History:  Procedure Laterality Date  . DILATION AND CURETTAGE OF UTERUS    . VAGINAL HYSTERECTOMY      OB History    No data available       Home Medications    Prior to Admission medications   Medication Sig Start Date End Date Taking? Authorizing Provider  acetaminophen (TYLENOL) 500 MG tablet Take 500 mg by mouth every 4 (four) hours as needed for mild pain, moderate pain, fever or headache.    Yes Historical Provider, MD  alum & mag hydroxide-simeth (MINTOX) 200-200-20 MG/5ML suspension Take 30 mLs by mouth as needed for indigestion or heartburn.    Yes Historical Provider, MD  brimonidine (ALPHAGAN P) 0.1 % SOLN Place 1 drop into both eyes 2 (two) times daily.   Yes Historical Provider, MD  brinzolamide (AZOPT) 1 % ophthalmic suspension Place 1 drop into both eyes 2 (two) times daily.   Yes Historical Provider, MD  Calcium Carbonate-Vitamin D (CALCIUM-D) 600-400 MG-UNIT TABS Take 1 tablet by mouth daily with breakfast.    Yes Historical Provider, MD  guaiFENesin (ROBITUSSIN) 100 MG/5ML liquid Take 200 mg by mouth every 6 (six) hours as needed for cough.   Yes Historical Provider, MD  levETIRAcetam (KEPPRA) 1000 MG tablet Take 1 tablet (1,000 mg total) by mouth 2 (two) times daily. 12/29/15  Yes York Spaniel, MD  lisinopril-hydrochlorothiazide (PRINZIDE,ZESTORETIC) 20-12.5 MG tablet Take 1 tablet by mouth daily with breakfast.    Yes  Historical Provider, MD  loperamide (IMODIUM) 2 MG capsule Take 2 mg by mouth as needed for diarrhea or loose stools.   Yes Historical Provider, MD  loratadine (CLARITIN) 10 MG tablet Take 10 mg by mouth daily with breakfast.    Yes Historical Provider, MD  LORazepam (ATIVAN) 0.5 MG tablet Take 0.25 mg by mouth at bedtime.    Yes Historical Provider, MD  LORazepam (ATIVAN) 0.5 MG tablet Take 0.5 mg by mouth every 8 (eight) hours as needed for anxiety.   Yes Historical Provider, MD  magnesium hydroxide (MILK OF MAGNESIA) 400 MG/5ML  suspension Take 30 mLs by mouth at bedtime as needed for mild constipation.   Yes Historical Provider, MD  mirtazapine (REMERON) 30 MG tablet Take 30 mg by mouth at bedtime.   Yes Historical Provider, MD  mometasone (NASONEX) 50 MCG/ACT nasal spray Place 2 sprays into the nose daily after breakfast.    Yes Historical Provider, MD  neomycin-bacitracin-polymyxin (NEOSPORIN) [phone] ointment Apply 1 application topically as needed (for wound care).    Yes Historical Provider, MD  sertraline (ZOLOFT) 50 MG tablet Take 1 tablet (50 mg total) by mouth daily. Patient taking differently: Take 75 mg by mouth daily with breakfast.  03/06/13  Yes Levert FeinsteinYijun Yan, MD  simvastatin (ZOCOR) 10 MG tablet Take 10 mg by mouth at bedtime.   Yes Historical Provider, MD  Skin Protectants, Misc. (MINERIN) CREA Apply 1 application topically at bedtime. Applies to whole body   Yes Historical Provider, MD  Travoprost, BAK Free, (TRAVATAN Z) 0.004 % SOLN ophthalmic solution Place 1 drop into both eyes at bedtime.   Yes Historical Provider, MD    Family History Family History  Problem Relation Age of Onset  . Cancer Mother     lung  . Hypertension Sister   . Thyroid disease Sister   . Hypertension Brother   . Diabetes Brother     Social History Social History  Substance Use Topics  . Smoking status: Never Smoker  . Smokeless tobacco: Never Used  . Alcohol use No     Allergies   Patient has no known allergies.   Review of Systems Review of Systems  Unable to perform ROS: Patient nonverbal  LEVEL 5 CAVEAT. PT IS NONVERBAL AT BASELINE   Physical Exam Updated Vital Signs BP 125/67   Pulse 60   Temp 97.6 F (36.4 C)   Resp 16   SpO2 100%   Physical Exam  Constitutional: She appears well-developed and well-nourished. No distress.  Nontoxic and in NAD  HENT:  Head: Normocephalic and atraumatic.  Mouth/Throat: Oropharynx is clear and moist.  Eyes: Conjunctivae and EOM are normal. Pupils are equal,  round, and reactive to light. No scleral icterus.  Neck:  C collar applied  Cardiovascular: Normal rate, regular rhythm and intact distal pulses.   Pulmonary/Chest: Effort normal. No respiratory distress. She has no wheezes. She has no rales.  Respirations even and unlabored  Musculoskeletal: Normal range of motion.  No leg shortening or malrotation. Normal ROM of BLE. No crepitus or deformity to pelvis. Abrasion to right shoulder. No crepitus to R shoulder; no effusion.  Neurological: She is alert. She exhibits normal muscle tone. Coordination normal.  Skin: Skin is warm and dry. No rash noted. She is not diaphoretic. No erythema. No pallor.     Abrasion to the right shoulder  Psychiatric: She has a normal mood and affect. Her behavior is normal.  Nursing note and vitals reviewed.  ED Treatments / Results  DIAGNOSTIC STUDIES: Oxygen Saturation is 100% on RA, normal by my interpretation.  COORDINATION OF CARE:  9:07 PM Discussed treatment plan,  Labs (all labs ordered are listed, but only abnormal results are displayed) Labs Reviewed  URINALYSIS, ROUTINE W REFLEX MICROSCOPIC    EKG  EKG Interpretation None       Radiology Dg Shoulder Right  Result Date: 05/03/2016 CLINICAL DATA:  Ground level fall from standing position EXAM: RIGHT SHOULDER - 2+ VIEW COMPARISON:  12/16/2015 FINDINGS: Healed fracture deformity of the proximal humerus. No superimposed acute fracture. No dislocation. No bone lesion or bony destruction. IMPRESSION: Chronic healed fracture deformity of the proximal humerus. No acute findings. Electronically Signed   By: Ellery Plunkaniel R Mitchell M.D.   On: 05/03/2016 00:13   Ct Head Wo Contrast  Result Date: 05/02/2016 CLINICAL DATA:  Fall from standing position. No loss of consciousness. History of Alzheimer's disease, hypertension, hyperlipidemia and seizures. EXAM: CT HEAD WITHOUT CONTRAST CT CERVICAL SPINE WITHOUT CONTRAST TECHNIQUE: Multidetector CT imaging of  the head and cervical spine was performed following the standard protocol without intravenous contrast. Multiplanar CT image reconstructions of the cervical spine were also generated. COMPARISON:  CT HEAD December 14, 2015 in CT cervical spine June 01, 2015 FINDINGS: CT HEAD FINDINGS BRAIN: Stable moderate to severe ventriculomegaly on the basis of global parenchymal brain volume loss with disproportionate mesial temporal lobe volume loss compatible with history of Alzheimer's disease. No intraparenchymal hemorrhage, mass effect nor midline shift. Patchy supratentorial white matter hypodensities less than expected for patient's age, though non-specific are most compatible with chronic small vessel ischemic disease. No acute large vascular territory infarcts. No abnormal extra-axial fluid collections. Basal cisterns are patent. VASCULAR: Mild calcific atherosclerosis of the carotid siphons. SKULL: No skull fracture. No significant scalp soft tissue swelling. SINUSES/ORBITS: Lobulated LEFT maxillary sinus mucosal thickening without paranasal sinus air-fluid levels. Mastoid air cells are well aerated. The included ocular globes and orbital contents are non-suspicious. OTHER: RIGHT maxillary tooth periapical lucency. CT CERVICAL SPINE FINDINGS ALIGNMENT: Straightened lordosis. Vertebral bodies in alignment. Mild levoscoliosis may be positional. SKULL BASE AND VERTEBRAE: Cervical vertebral bodies and posterior elements are intact. Mild C5-6 disc height loss with uncovertebral hypertrophy compatible with degenerative discs. RIGHT C7 ribs. No destructive bony lesions. C1-2 articulation maintained. SOFT TISSUES AND SPINAL CANAL: Nonacute. DISC LEVELS: No significant osseous canal stenosis. Mild RIGHT C3-4, RIGHT C4-5 and moderate to severe bilateral C5-6 neural foraminal narrowing. UPPER CHEST: Lung apices are clear. OTHER: None. IMPRESSION: CT HEAD: No acute intracranial process. Stable examination including moderate to  severe atrophy. CT CERVICAL SPINE: No acute fracture or malalignment. Electronically Signed   By: Awilda Metroourtnay  Bloomer M.D.   On: 05/02/2016 22:02   Ct Cervical Spine Wo Contrast  Result Date: 05/02/2016 CLINICAL DATA:  Fall from standing position. No loss of consciousness. History of Alzheimer's disease, hypertension, hyperlipidemia and seizures. EXAM: CT HEAD WITHOUT CONTRAST CT CERVICAL SPINE WITHOUT CONTRAST TECHNIQUE: Multidetector CT imaging of the head and cervical spine was performed following the standard protocol without intravenous contrast. Multiplanar CT image reconstructions of the cervical spine were also generated. COMPARISON:  CT HEAD December 14, 2015 in CT cervical spine June 01, 2015 FINDINGS: CT HEAD FINDINGS BRAIN: Stable moderate to severe ventriculomegaly on the basis of global parenchymal brain volume loss with disproportionate mesial temporal lobe volume loss compatible with history of Alzheimer's disease. No intraparenchymal hemorrhage, mass effect nor midline shift. Patchy supratentorial white matter hypodensities less than expected  for patient's age, though non-specific are most compatible with chronic small vessel ischemic disease. No acute large vascular territory infarcts. No abnormal extra-axial fluid collections. Basal cisterns are patent. VASCULAR: Mild calcific atherosclerosis of the carotid siphons. SKULL: No skull fracture. No significant scalp soft tissue swelling. SINUSES/ORBITS: Lobulated LEFT maxillary sinus mucosal thickening without paranasal sinus air-fluid levels. Mastoid air cells are well aerated. The included ocular globes and orbital contents are non-suspicious. OTHER: RIGHT maxillary tooth periapical lucency. CT CERVICAL SPINE FINDINGS ALIGNMENT: Straightened lordosis. Vertebral bodies in alignment. Mild levoscoliosis may be positional. SKULL BASE AND VERTEBRAE: Cervical vertebral bodies and posterior elements are intact. Mild C5-6 disc height loss with uncovertebral  hypertrophy compatible with degenerative discs. RIGHT C7 ribs. No destructive bony lesions. C1-2 articulation maintained. SOFT TISSUES AND SPINAL CANAL: Nonacute. DISC LEVELS: No significant osseous canal stenosis. Mild RIGHT C3-4, RIGHT C4-5 and moderate to severe bilateral C5-6 neural foraminal narrowing. UPPER CHEST: Lung apices are clear. OTHER: None. IMPRESSION: CT HEAD: No acute intracranial process. Stable examination including moderate to severe atrophy. CT CERVICAL SPINE: No acute fracture or malalignment. Electronically Signed   By: Awilda Metro M.D.   On: 05/02/2016 22:02    Procedures Procedures (including critical care time)  Medications Ordered in ED Medications - No data to display   Initial Impression / Assessment and Plan / ED Course  I have reviewed the triage vital signs and the nursing notes.  Pertinent labs & imaging results that were available during my care of the patient were reviewed by me and considered in my medical decision making (see chart for details).  Clinical Course     60 year old female, nonverbal at baseline, with history of dementia presents after a witnessed fall at her facility. No other evidence of trauma, besides abrasion to right shoulder. No skull instability, battle sign, or raccoon's eyes. Imaging reassuring. No evidence of urinary tract infection. Vitals stable. No indication for further emergent workup. Patient to be transferred back to her facility.   Final Clinical Impressions(s) / ED Diagnoses   Final diagnoses:  Fall, initial encounter  Abrasion of right shoulder, initial encounter    New Prescriptions New Prescriptions   No medications on file    I personally performed the services described in this documentation, which was scribed in my presence. The recorded information has been reviewed and is accurate.      Antony Madura, PA-C 05/03/16 0038    Shaune Pollack, MD 05/03/16 [phone]

## 2016-05-03 NOTE — ED Notes (Signed)
[facility] called  

## 2016-05-03 NOTE — ED Notes (Signed)
Attempted to call x 4 on  [facility] to update staff on pt status and treatments. The mailbox remained full and no option was available to speak with operator.

## 2016-05-03 NOTE — ED Notes (Signed)
[facility] Picked up patient. 

## 2016-06-08 ENCOUNTER — Emergency Department (HOSPITAL_COMMUNITY)
Admission: EM | Admit: 2016-06-08 | Discharge: 2016-06-08 | Disposition: A | Discharge status: Home / Self Care | Payer: Medicare HMO | Attending: Emergency Medicine | Admitting: Emergency Medicine

## 2016-06-08 ENCOUNTER — Encounter (HOSPITAL_COMMUNITY): Payer: Self-pay

## 2016-06-08 DIAGNOSIS — R569 Unspecified convulsions: Secondary | ICD-10-CM

## 2016-06-08 DIAGNOSIS — G309 Alzheimer's disease, unspecified: Secondary | ICD-10-CM | POA: Insufficient documentation

## 2016-06-08 DIAGNOSIS — Z79899 Other long term (current) drug therapy: Secondary | ICD-10-CM | POA: Diagnosis not present

## 2016-06-08 DIAGNOSIS — I10 Essential (primary) hypertension: Secondary | ICD-10-CM | POA: Insufficient documentation

## 2016-06-08 LAB — CBC WITH DIFFERENTIAL/PLATELET
Basophils Absolute: 0 10*3/uL (ref 0.0–0.1)
Basophils Relative: 0 %
Eosinophils Absolute: 0.1 10*3/uL (ref 0.0–0.7)
Eosinophils Relative: 1 %
HCT: 33.2 % — ABNORMAL LOW (ref 36.0–46.0)
Hemoglobin: 10.5 g/dL — ABNORMAL LOW (ref 12.0–15.0)
Lymphocytes Relative: 25 %
Lymphs Abs: 1.5 10*3/uL (ref 0.7–4.0)
MCH: 25.1 pg — ABNORMAL LOW (ref 26.0–34.0)
MCHC: 31.6 g/dL (ref 30.0–36.0)
MCV: 79.4 fL (ref 78.0–100.0)
Monocytes Absolute: 0.4 10*3/uL (ref 0.1–1.0)
Monocytes Relative: 7 %
Neutro Abs: 4.1 10*3/uL (ref 1.7–7.7)
Neutrophils Relative %: 67 %
Platelets: 224 10*3/uL (ref 150–400)
RBC: 4.18 MIL/uL (ref 3.87–5.11)
RDW: 15.2 % (ref 11.5–15.5)
WBC: 6.2 10*3/uL (ref 4.0–10.5)

## 2016-06-08 LAB — URINALYSIS, ROUTINE W REFLEX MICROSCOPIC
Bacteria, UA: NONE SEEN
Bilirubin Urine: NEGATIVE
Glucose, UA: NEGATIVE mg/dL
Hgb urine dipstick: NEGATIVE
Ketones, ur: NEGATIVE mg/dL
Nitrite: NEGATIVE
Protein, ur: 30 mg/dL — AB
Specific Gravity, Urine: 1.025 (ref 1.005–1.030)
pH: 7 (ref 5.0–8.0)

## 2016-06-08 LAB — BASIC METABOLIC PANEL
Anion gap: 7 (ref 5–15)
BUN: 22 mg/dL — ABNORMAL HIGH (ref 6–20)
CO2: 25 mmol/L (ref 22–32)
Calcium: 9.1 mg/dL (ref 8.9–10.3)
Chloride: 112 mmol/L — ABNORMAL HIGH (ref 101–111)
Creatinine, Ser: 0.85 mg/dL (ref 0.44–1.00)
GFR calc Af Amer: >60 mL/min (ref >60)
GFR calc non Af Amer: >60 mL/min (ref >60)
Glucose, Bld: 94 mg/dL (ref 65–99)
Potassium: 3.9 mmol/L (ref 3.5–5.1)
Sodium: 144 mmol/L (ref 135–145)

## 2016-06-08 LAB — CBG MONITORING, ED: GLUCOSE-CAPILLARY: 97 mg/dL (ref 65–99)

## 2016-06-08 MED ORDER — LORAZEPAM 2 MG/ML IJ SOLN
2.0000 mg | Freq: Once | INTRAMUSCULAR | Status: AC
  Administered 2016-06-08: 2 mg via INTRAMUSCULAR
  Filled 2016-06-08: qty 1

## 2016-06-08 NOTE — ED Notes (Signed)
ED Provider at bedside. 

## 2016-06-08 NOTE — ED Notes (Signed)
Bed: [id number] Expected date:  Expected time:  Means of arrival:  Comments: 61 yr old seizure

## 2016-06-08 NOTE — ED Notes (Signed)
Bed: WHALD Expected date:  Expected time:  Means of arrival:  Comments: 

## 2016-06-08 NOTE — ED Notes (Signed)
Patient is alert to self.  Discharge information given to facility.

## 2016-06-08 NOTE — ED Provider Notes (Signed)
[facility]-EMERGENCY DEPT Provider Note   CSN: [id number] Arrival date & time: 06/08/16  9562     History   Chief Complaint Chief Complaint  Patient presents with  . Seizures    HPI Alexandria Lowe is a 61 y.o. female with history of seizure disorder, dementia, who is nonverbal who presents following seizure. Level V caveat. Limited information could be gathered from [place] assisted living facility. I spoke with Bradley Ferris, a nurse working on first shift. She states that no report was written by third shift. We are reported that the patient has seizures from time to time without known cause. The patient has been feeling well lately. The patient is nonverbal and does not follow commands at baseline.   HPI  Past Medical History:  Diagnosis Date  . Alzheimer's disease 06/17/2014  . Convulsions/seizures (HCC) 06/17/2014  . Depression   . Hyperlipidemia   . Hypertension   . Nonverbal    "for awhile now"/sister (06/01/2015)    Patient Active Problem List   Diagnosis Date Noted  . Altered mental state   . Fracture of right humerus 11/05/2015  . Anemia 11/05/2015  . Essential hypertension 11/05/2015  . Hyperlipidemia 11/05/2015  . "walking corpse" syndrome   . Seizures (HCC) 11/04/2015  . Contusion   . Altered mental status 06/01/2015  . Seizure (HCC)   . Alzheimer's disease 06/17/2014  . Convulsions/seizures (HCC) 06/17/2014  . Hypertensive urgency 03/10/2013  . Hypokalemia 03/10/2013  . Pre-syncope 03/10/2013  . Dementia     Past Surgical History:  Procedure Laterality Date  . DILATION AND CURETTAGE OF UTERUS    . VAGINAL HYSTERECTOMY      OB History    No data available       Home Medications    Prior to Admission medications   Medication Sig Start Date End Date Taking? Authorizing Provider  acetaminophen (TYLENOL) 500 MG tablet Take 500 mg by mouth every 4 (four) hours as needed for mild pain, moderate pain, fever or headache.    Yes Historical  Provider, MD  alum & mag hydroxide-simeth (MINTOX) 200-200-20 MG/5ML suspension Take 30 mLs by mouth as needed for indigestion or heartburn.    Yes Historical Provider, MD  brimonidine (ALPHAGAN P) 0.1 % SOLN Place 1 drop into both eyes 2 (two) times daily.   Yes Historical Provider, MD  brinzolamide (AZOPT) 1 % ophthalmic suspension Place 1 drop into both eyes 2 (two) times daily.   Yes Historical Provider, MD  Calcium Carbonate-Vitamin D (CALCIUM-D) 600-400 MG-UNIT TABS Take 1 tablet by mouth daily with breakfast.    Yes Historical Provider, MD  guaiFENesin (ROBITUSSIN) 100 MG/5ML liquid Take 200 mg by mouth every 6 (six) hours as needed for cough.   Yes Historical Provider, MD  levETIRAcetam (KEPPRA) 1000 MG tablet Take 1 tablet (1,000 mg total) by mouth 2 (two) times daily. 12/29/15  Yes York Spaniel, MD  lisinopril-hydrochlorothiazide (PRINZIDE,ZESTORETIC) 20-12.5 MG tablet Take 1 tablet by mouth daily with breakfast.    Yes Historical Provider, MD  loperamide (IMODIUM) 2 MG capsule Take 2 mg by mouth as needed for diarrhea or loose stools.   Yes Historical Provider, MD  loratadine (CLARITIN) 10 MG tablet Take 10 mg by mouth daily with breakfast.    Yes Historical Provider, MD  LORazepam (ATIVAN) 0.5 MG tablet Take 0.5 mg by mouth every 8 (eight) hours as needed for anxiety.   Yes Historical Provider, MD  magnesium hydroxide (MILK OF MAGNESIA) 400 MG/5ML suspension  Take 30 mLs by mouth at bedtime as needed for mild constipation.   Yes Historical Provider, MD  mirtazapine (REMERON) 30 MG tablet Take 30 mg by mouth at bedtime.   Yes Historical Provider, MD  mometasone (NASONEX) 50 MCG/ACT nasal spray Place 2 sprays into the nose daily after breakfast.    Yes Historical Provider, MD  neomycin-bacitracin-polymyxin (NEOSPORIN) [phone] ointment Apply 1 application topically as needed (for wound care).    Yes Historical Provider, MD  Nutritional Supplements (NUTRITIONAL SHAKE PO) Take 1 Can by  mouth 3 (three) times daily. Mighty Shakes   Yes Historical Provider, MD  sertraline (ZOLOFT) 50 MG tablet Take 1 tablet (50 mg total) by mouth daily. Patient taking differently: Take 75 mg by mouth daily with breakfast.  03/06/13  Yes Levert Feinstein, MD  simvastatin (ZOCOR) 10 MG tablet Take 10 mg by mouth at bedtime.   Yes Historical Provider, MD  Skin Protectants, Misc. (MINERIN) CREA Apply 1 application topically at bedtime. Applies to whole body   Yes Historical Provider, MD  Travoprost, BAK Free, (TRAVATAN Z) 0.004 % SOLN ophthalmic solution Place 1 drop into both eyes at bedtime.   Yes Historical Provider, MD    Family History Family History  Problem Relation Age of Onset  . Cancer Mother     lung  . Hypertension Sister   . Thyroid disease Sister   . Hypertension Brother   . Diabetes Brother     Social History Social History  Substance Use Topics  . Smoking status: Never Smoker  . Smokeless tobacco: Never Used  . Alcohol use No     Allergies   Patient has no known allergies.   Review of Systems Review of Systems  Unable to perform ROS: Patient nonverbal     Physical Exam Updated Vital Signs BP 131/70   Pulse 75   Temp 98.4 F (36.9 C) (Oral)   Resp 17   SpO2 100%   Physical Exam  Constitutional: She appears well-developed and well-nourished. No distress.  HENT:  Head: Normocephalic and atraumatic.  Mouth/Throat: Oropharynx is clear and moist. No oropharyngeal exudate.  Eyes: Conjunctivae are normal. Pupils are equal, round, and reactive to light. Right eye exhibits no discharge. Left eye exhibits no discharge. No scleral icterus.  Neck: Normal range of motion. Neck supple. No thyromegaly present.  Cardiovascular: Normal rate, regular rhythm, normal heart sounds and intact distal pulses.  Exam reveals no gallop and no friction rub.   No murmur heard. Pulmonary/Chest: Effort normal and breath sounds normal. No stridor. No respiratory distress. She has no  wheezes. She has no rales.  Abdominal: Soft. Bowel sounds are normal. She exhibits no distension. There is no tenderness. There is no rebound and no guarding.  Musculoskeletal: She exhibits no edema.  Lymphadenopathy:    She has no cervical adenopathy.  Neurological: She is alert. Coordination normal.  Skin: Skin is warm and dry. No rash noted. She is not diaphoretic. No pallor.  Psychiatric: She has a normal mood and affect.  Nursing note and vitals reviewed.    ED Treatments / Results  Labs (all labs ordered are listed, but only abnormal results are displayed) Labs Reviewed  CBC WITH DIFFERENTIAL/PLATELET - Abnormal; Notable for the following:       Result Value   Hemoglobin 10.5 (*)    HCT 33.2 (*)    MCH 25.1 (*)    All other components within normal limits  BASIC METABOLIC PANEL - Abnormal; Notable for the following:  Chloride 112 (*)    BUN 22 (*)    All other components within normal limits  URINALYSIS, ROUTINE W REFLEX MICROSCOPIC - Abnormal; Notable for the following:    Protein, ur 30 (*)    Leukocytes, UA MODERATE (*)    Squamous Epithelial / LPF 0-5 (*)    All other components within normal limits  URINE CULTURE  CBG MONITORING, ED    EKG  EKG Interpretation None       Radiology No results found.  Procedures Procedures (including critical care time)  Medications Ordered in ED Medications  LORazepam (ATIVAN) injection 2 mg (2 mg Intramuscular Given 06/08/16 1053)     Initial Impression / Assessment and Plan / ED Course  I have reviewed the triage vital signs and the nursing notes.  Pertinent labs & imaging results that were available during my care of the patient were reviewed by me and considered in my medical decision making (see chart for details).  Clinical Course     CBC shows stable chronic anemia, hemoglobin 10.5. BMP shows chloride 112, BUN 22. UA shows moderate leukocytes, 6-30 WBCs. Urine culture sent and would treat if positive.  Patient most likely at baseline according to nursing facility. No known cause to seizures in the past or today. Patient did have an episode of agitation for which Ativan was given and constipation. Did not resemble seizure activity. Unsure if this is what happened earlier today which prompted arrival to the ED. Patient to be discharged home to nursing facility. Return precautions outlined on discharge paperwork for her nursing facility. Patient vitals stable throughout ED course discharged in satisfactory condition. Patient also evaluated by Dr. Jeraldine LootsLockwood who guided the patient's management and agrees with plan.  Final Clinical Impressions(s) / ED Diagnoses   Final diagnoses:  Seizure-like activity [facility](HCC)    New Prescriptions New Prescriptions   No medications on file     Emi Holeslexandra M Eliazar Olivar, Cordelia Poche-C 06/08/16 1404    Gerhard Munchobert Lockwood, MD 06/09/16 [phone]

## 2016-06-08 NOTE — ED Notes (Signed)
Contacted facility and [facility] to pick up patient

## 2016-06-08 NOTE — ED Notes (Signed)
[facility] staff called EMS for a seizure Pt has a hx of seizures and pt is nonverbal, staff says that she is baseline right now

## 2016-06-08 NOTE — Discharge Instructions (Signed)
Your urine showed some white blood cells and will be cultured. You will be called if antibody treatment is required. Please follow-up with your primary care provider for follow-up of today's visit. Please return to the emergency department if you develop any new or worsening symptoms.

## 2016-06-08 NOTE — ED Notes (Signed)
Checked on patient after seizure.  She is orientated to self.

## 2016-06-08 NOTE — ED Notes (Signed)
Alexandria Lowe  (sister) [phone]

## 2016-06-08 NOTE — ED Notes (Addendum)
Walked into patient room to do in and out and once I pulled the covers off the patient she yelled out and visibly started having a seizure that lasted about 1 minute. RN notified

## 2016-06-08 NOTE — ED Triage Notes (Addendum)
Patient was having a seizure. Patient did not hit her head. Patient was laying in the bed having a seizure. Patient did not receive any medication per Trula Orehristina at facility. Patient is non verbal.

## 2016-06-09 LAB — URINE CULTURE
Culture: NO GROWTH
Special Requests: NORMAL

## 2016-07-03 ENCOUNTER — Encounter: Payer: Self-pay | Admitting: Neurology

## 2016-07-03 ENCOUNTER — Ambulatory Visit (INDEPENDENT_AMBULATORY_CARE_PROVIDER_SITE_OTHER): Payer: Medicare HMO | Admitting: Neurology

## 2016-07-03 VITALS — BP 121/71 | HR 67 | Ht 68.0 in | Wt 140.5 lb

## 2016-07-03 DIAGNOSIS — R569 Unspecified convulsions: Secondary | ICD-10-CM | POA: Diagnosis not present

## 2016-07-03 MED ORDER — DIVALPROEX SODIUM 125 MG PO DR TAB: DELAYED_RELEASE_TABLET | ORAL | 5 refills | Status: DC

## 2016-07-03 NOTE — Patient Instructions (Signed)
   We will start Depakote for the seizures, consider tapering off of the Keppra in the future.  Depakote (valproic acid) is a seizure medication that also has an FDA approval for migraine headache. The most common potential side effects of this medication include weight gain, tremor, or possible stomach upset. This medication can potentially cause liver problems. If confusion is noted on this medication, contact our office immediately.

## 2016-07-03 NOTE — Progress Notes (Signed)
Reason for visit: Seizures  Alexandria Lowe is an 61 y.o. female  History of present illness:  Alexandria Lowe is a 61 year old right-handed black female with a history of end-stage Alzheimer's disease and a history of seizures. The patient has been in the emergency room on 2 occasions since last seen, once on 04/02/2016 and again on 11/06/2016. The patient comes into the office today with a caretaker. The patient generally will have some sort of an episode of agitation, then she may go into a seizure. The patient fell with the last seizure event necessitating an emergency room visit. The patient is on Keppra taking 1000 mg twice daily. The patient seems to benefit as well from the use of Ativan for the agitation. The patient is minimally verbal, she is unable to communicate her needs. The patient at times may refuse her medications making it difficult to treat her seizures. The patient returns to this office for an evaluation.  Past Medical History:  Diagnosis Date  . Alzheimer's disease 06/17/2014  . Convulsions/seizures (HCC) 06/17/2014  . Depression   . Hyperlipidemia   . Hypertension   . Nonverbal    "for awhile now"/sister (06/01/2015)    Past Surgical History:  Procedure Laterality Date  . DILATION AND CURETTAGE OF UTERUS    . VAGINAL HYSTERECTOMY      Family History  Problem Relation Age of Onset  . Cancer Mother     lung  . Hypertension Sister   . Thyroid disease Sister   . Hypertension Brother   . Diabetes Brother     Social history:  reports that she has never smoked. She has never used smokeless tobacco. She reports that she does not drink alcohol or use drugs.   No Known Allergies  Medications:  Prior to Admission medications   Medication Sig Start Date End Date Taking? Authorizing Provider  acetaminophen (TYLENOL) 500 MG tablet Take 500 mg by mouth every 4 (four) hours as needed for mild pain, moderate pain, fever or headache.    Yes Historical Provider, MD  alum &  mag hydroxide-simeth (MINTOX) 200-200-20 MG/5ML suspension Take 30 mLs by mouth as needed for indigestion or heartburn.    Yes Historical Provider, MD  brimonidine (ALPHAGAN P) 0.1 % SOLN Place 1 drop into both eyes 2 (two) times daily.   Yes Historical Provider, MD  brinzolamide (AZOPT) 1 % ophthalmic suspension Place 1 drop into both eyes 2 (two) times daily.   Yes Historical Provider, MD  Calcium Carbonate-Vitamin D (CALCIUM-D) 600-400 MG-UNIT TABS Take 1 tablet by mouth daily with breakfast.    Yes Historical Provider, MD  guaiFENesin (ROBITUSSIN) 100 MG/5ML liquid Take 200 mg by mouth every 6 (six) hours as needed for cough.   Yes Historical Provider, MD  levETIRAcetam (KEPPRA) 1000 MG tablet Take 1 tablet (1,000 mg total) by mouth 2 (two) times daily. 12/29/15  Yes York Spanielharles K Willis, MD  lisinopril-hydrochlorothiazide (PRINZIDE,ZESTORETIC) 20-12.5 MG tablet Take 1 tablet by mouth daily with breakfast.    Yes Historical Provider, MD  loperamide (IMODIUM) 2 MG capsule Take 2 mg by mouth as needed for diarrhea or loose stools.   Yes Historical Provider, MD  loratadine (CLARITIN) 10 MG tablet Take 10 mg by mouth daily with breakfast.    Yes Historical Provider, MD  LORazepam (ATIVAN) 0.5 MG tablet Take 0.5 mg by mouth every 8 (eight) hours as needed for anxiety.   Yes Historical Provider, MD  magnesium hydroxide (MILK OF MAGNESIA) 400 MG/5ML  suspension Take 30 mLs by mouth at bedtime as needed for mild constipation.   Yes Historical Provider, MD  mirtazapine (REMERON) 30 MG tablet Take 30 mg by mouth at bedtime.   Yes Historical Provider, MD  mometasone (NASONEX) 50 MCG/ACT nasal spray Place 2 sprays into the nose daily after breakfast.    Yes Historical Provider, MD  neomycin-bacitracin-polymyxin (NEOSPORIN) [phone] ointment Apply 1 application topically as needed (for wound care).    Yes Historical Provider, MD  Nutritional Supplements (NUTRITIONAL SHAKE PO) Take 1 Can by mouth 3 (three) times  daily. Mighty Shakes   Yes Historical Provider, MD  sertraline (ZOLOFT) 50 MG tablet Take 1 tablet (50 mg total) by mouth daily. Patient taking differently: Take 75 mg by mouth daily with breakfast.  03/06/13  Yes Levert Feinstein, MD  simvastatin (ZOCOR) 10 MG tablet Take 10 mg by mouth at bedtime.   Yes Historical Provider, MD  Skin Protectants, Misc. (MINERIN) CREA Apply 1 application topically at bedtime. Applies to whole body   Yes Historical Provider, MD  Travoprost, BAK Free, (TRAVATAN Z) 0.004 % SOLN ophthalmic solution Place 1 drop into both eyes at bedtime.   Yes Historical Provider, MD  divalproex (DEPAKOTE) 125 MG DR tablet 2 capsules twice a day for 2 weeks, then take 4 capsules twice a day 07/03/16   York Spaniel, MD    ROS:  Out of a complete 14 system review of symptoms, the patient complains only of the following symptoms, and all other reviewed systems are negative.  Seizures Agitation  Blood pressure 121/71, pulse 67, height 5\' 8"  (1.727 m), weight 140 lb 8 oz (63.7 kg).  Physical Exam  General: The patient is alert and cooperative at the time of the examination.  Skin: No significant peripheral edema is noted.   Neurologic Exam  Mental status: The patient is alert and seems to be cooperative, the patient will mumble, she will not respond verbally to questions. She is not oriented to person, place, or date.   Cranial nerves: Facial symmetry is present. Speech is normal, no aphasia or dysarthria is noted. Extraocular movements are full. Visual fields are full to threat.  Motor: The patient has good strength in all 4 extremities.  Sensory examination: The patient responds in a symmetric fashion to deep pain stimulation on all 4 extremities..  Coordination: The patient is unable to follow commands for cerebellar testing..  Gait and station: The patient has a normal gait. The patient is able to ambulate independently.  Reflexes: Deep tendon reflexes are  symmetric.   Assessment/Plan:  1. End-stage Alzheimer's disease  2. History of seizures  The patient has had problems with agitation as well as with seizures. The Keppra sometimes can worsen agitation. The patient is also having problems with refusing to take medications at times. We will start Depakote sprinkles taking 250 mg twice daily for 2 weeks then go to 500 mg twice daily. If she seems to do well with this, we will check blood levels and consider a slow taper off of the Keppra. She will follow-up in 2 months.  Marlan Palau MD 07/03/2016 10:33 AM  [facility] [address] [place], [place] [address]  Phone [phone] Fax [phone]

## 2016-07-17 ENCOUNTER — Other Ambulatory Visit: Payer: Self-pay | Admitting: *Deleted

## 2016-07-17 ENCOUNTER — Encounter: Payer: Self-pay | Admitting: *Deleted

## 2016-07-17 MED ORDER — LEVETIRACETAM 1000 MG PO TABS: 1000.0000 mg | ORAL_TABLET | Freq: Two times a day (BID) | ORAL | 11 refills | Status: DC

## 2016-07-27 ENCOUNTER — Encounter (HOSPITAL_COMMUNITY): Payer: Self-pay

## 2016-07-27 ENCOUNTER — Emergency Department (HOSPITAL_COMMUNITY)
Admission: EM | Admit: 2016-07-27 | Discharge: 2016-07-27 | Disposition: A | Discharge status: Home / Self Care | Payer: Medicare HMO | Attending: Emergency Medicine | Admitting: Emergency Medicine

## 2016-07-27 DIAGNOSIS — Z79899 Other long term (current) drug therapy: Secondary | ICD-10-CM | POA: Diagnosis not present

## 2016-07-27 DIAGNOSIS — F0281 Dementia in other diseases classified elsewhere with behavioral disturbance: Secondary | ICD-10-CM | POA: Diagnosis not present

## 2016-07-27 DIAGNOSIS — N3000 Acute cystitis without hematuria: Secondary | ICD-10-CM | POA: Insufficient documentation

## 2016-07-27 DIAGNOSIS — R4182 Altered mental status, unspecified: Secondary | ICD-10-CM | POA: Diagnosis present

## 2016-07-27 DIAGNOSIS — G309 Alzheimer's disease, unspecified: Secondary | ICD-10-CM | POA: Insufficient documentation

## 2016-07-27 DIAGNOSIS — I10 Essential (primary) hypertension: Secondary | ICD-10-CM | POA: Diagnosis not present

## 2016-07-27 LAB — COMPREHENSIVE METABOLIC PANEL
ALK PHOS: 53 U/L (ref 38–126)
ALT: 13 U/L — AB (ref 14–54)
AST: 20 U/L (ref 15–41)
Albumin: 3.6 g/dL (ref 3.5–5.0)
Anion gap: 6 (ref 5–15)
BUN: 19 mg/dL (ref 6–20)
CALCIUM: 9.1 mg/dL (ref 8.9–10.3)
CO2: 28 mmol/L (ref 22–32)
CREATININE: 1.18 mg/dL — AB (ref 0.44–1.00)
Chloride: 110 mmol/L (ref 101–111)
GFR, EST AFRICAN AMERICAN: 56 mL/min — AB (ref >60)
GFR, EST NON AFRICAN AMERICAN: 49 mL/min — AB (ref >60)
Glucose, Bld: 124 mg/dL — ABNORMAL HIGH (ref 65–99)
Potassium: 3.8 mmol/L (ref 3.5–5.1)
Sodium: 144 mmol/L (ref 135–145)
Total Bilirubin: 0.3 mg/dL (ref 0.3–1.2)
Total Protein: 6.3 g/dL — ABNORMAL LOW (ref 6.5–8.1)

## 2016-07-27 LAB — URINALYSIS, ROUTINE W REFLEX MICROSCOPIC
Bilirubin Urine: NEGATIVE
Glucose, UA: NEGATIVE mg/dL
Ketones, ur: NEGATIVE mg/dL
Nitrite: NEGATIVE
PH: 5 (ref 5.0–8.0)
Protein, ur: 30 mg/dL — AB
SPECIFIC GRAVITY, URINE: 1.025 (ref 1.005–1.030)

## 2016-07-27 LAB — CBC
HCT: 32.3 % — ABNORMAL LOW (ref 36.0–46.0)
Hemoglobin: 10.1 g/dL — ABNORMAL LOW (ref 12.0–15.0)
MCH: 25.1 pg — ABNORMAL LOW (ref 26.0–34.0)
MCHC: 31.3 g/dL (ref 30.0–36.0)
MCV: 80.3 fL (ref 78.0–100.0)
PLATELETS: 227 10*3/uL (ref 150–400)
RBC: 4.02 MIL/uL (ref 3.87–5.11)
RDW: 15.4 % (ref 11.5–15.5)
WBC: 6.7 10*3/uL (ref 4.0–10.5)

## 2016-07-27 LAB — VALPROIC ACID LEVEL: VALPROIC ACID LVL: 44 ug/mL — AB (ref 50.0–100.0)

## 2016-07-27 MED ORDER — CEPHALEXIN 250 MG PO CAPS
250.0000 mg | ORAL_CAPSULE | Freq: Once | ORAL | Status: AC
  Administered 2016-07-27: 250 mg via ORAL
  Filled 2016-07-27: qty 1

## 2016-07-27 MED ORDER — CEPHALEXIN 250 MG PO CAPS: 250.0000 mg | ORAL_CAPSULE | Freq: Four times a day (QID) | ORAL | 0 refills | Status: DC

## 2016-07-27 NOTE — ED Notes (Signed)
Pt stable and NAD upon d/c. Facility called and report given as well as to [facility] about pt having UTI and antibiotics. VSS.

## 2016-07-27 NOTE — ED Triage Notes (Addendum)
Per EMS[facility], staff reports that patient has been increasingly agitated since yesterday morning and screaming out, not being interactive with the other residents which is abnormal for her. Pt has alzheimers but is alert to her normal. VSS, CBG 289 with EMS. Pt is nonverbal and has hx of same.

## 2016-07-27 NOTE — ED Notes (Signed)
Patient in a gown and on the monitor ?

## 2016-07-27 NOTE — ED Notes (Signed)
[facility] contacted for tx back to [facility] 

## 2016-07-27 NOTE — Discharge Instructions (Signed)
There were no serious findings today.  She may have a urinary tract infection, so we are starting an antibiotic.  Continue to give her Ativan as needed for agitation.  She needs to follow-up with Dr. Anne HahnWillis and have another Depakote level done, as planned, in about 1 month.  At that time she may be able to be weaned off the Keppra, since it may be contributing to her agitation.

## 2016-07-27 NOTE — ED Provider Notes (Signed)
[facility]-EMERGENCY DEPT Provider Note   CSN: [id number] Arrival date & time: 07/27/16  1622     History   Chief Complaint Chief Complaint  Patient presents with  . Altered Mental Status    HPI Alexandria Lowe is a 61 y.o. female.  She presents for evaluation of agitated behavior, by report from her facility.  During assessment and transport EMS services did not see any agitation.  She apparently is taking her usual medications.  She was recently seen by neurology who planned on weaning her Keppra, and starting Depakote, as a measure to decrease to her agitation.  Apparently, Keppra, can cause some agitation.  The patient is unable to give any history.  Level 5 caveat-dementia  HPI  Past Medical History:  Diagnosis Date  . Alzheimer's disease 06/17/2014  . Convulsions/seizures (HCC) 06/17/2014  . Depression   . Hyperlipidemia   . Hypertension   . Nonverbal    "for awhile now"/sister (06/01/2015)    Patient Active Problem List   Diagnosis Date Noted  . Altered mental state   . Fracture of right humerus 11/05/2015  . Anemia 11/05/2015  . Essential hypertension 11/05/2015  . Hyperlipidemia 11/05/2015  . "walking corpse" syndrome   . Seizures (HCC) 11/04/2015  . Contusion   . Altered mental status 06/01/2015  . Seizure (HCC)   . Alzheimer's disease 06/17/2014  . Convulsions/seizures (HCC) 06/17/2014  . Hypertensive urgency 03/10/2013  . Hypokalemia 03/10/2013  . Pre-syncope 03/10/2013  . Dementia     Past Surgical History:  Procedure Laterality Date  . DILATION AND CURETTAGE OF UTERUS    . VAGINAL HYSTERECTOMY      OB History    No data available       Home Medications    Prior to Admission medications   Medication Sig Start Date End Date Taking? Authorizing Provider  acetaminophen (TYLENOL) 500 MG tablet Take 500 mg by mouth every 4 (four) hours as needed for mild pain, moderate pain, fever or headache.     Historical Provider, MD  alum & mag hydroxide-simeth  (MINTOX) 200-200-20 MG/5ML suspension Take 30 mLs by mouth as needed for indigestion or heartburn.     Historical Provider, MD  brimonidine (ALPHAGAN P) 0.1 % SOLN Place 1 drop into both eyes 2 (two) times daily.    Historical Provider, MD  brinzolamide (AZOPT) 1 % ophthalmic suspension Place 1 drop into both eyes 2 (two) times daily.    Historical Provider, MD  Calcium Carbonate-Vitamin D (CALCIUM-D) 600-400 MG-UNIT TABS Take 1 tablet by mouth daily with breakfast.     Historical Provider, MD  cephALEXin (KEFLEX) 250 MG capsule Take 1 capsule (250 mg total) by mouth 4 (four) times daily. 07/27/16   Mancel Bale, MD  divalproex (DEPAKOTE) 125 MG DR tablet 2 capsules twice a day for 2 weeks, then take 4 capsules twice a day 07/03/16   York Spaniel, MD  guaiFENesin (ROBITUSSIN) 100 MG/5ML liquid Take 200 mg by mouth every 6 (six) hours as needed for cough.    Historical Provider, MD  levETIRAcetam (KEPPRA) 1000 MG tablet Take 1 tablet (1,000 mg total) by mouth 2 (two) times daily. 07/17/16   York Spaniel, MD  lisinopril-hydrochlorothiazide (PRINZIDE,ZESTORETIC) 20-12.5 MG tablet Take 1 tablet by mouth daily with breakfast.     Historical Provider, MD  loperamide (IMODIUM) 2 MG capsule Take 2 mg by mouth as needed for diarrhea or loose stools.    Historical Provider, MD  loratadine (CLARITIN) 10 MG  tablet Take 10 mg by mouth daily with breakfast.     Historical Provider, MD  LORazepam (ATIVAN) 0.5 MG tablet Take 0.5 mg by mouth every 8 (eight) hours as needed for anxiety.    Historical Provider, MD  magnesium hydroxide (MILK OF MAGNESIA) 400 MG/5ML suspension Take 30 mLs by mouth at bedtime as needed for mild constipation.    Historical Provider, MD  mirtazapine (REMERON) 30 MG tablet Take 30 mg by mouth at bedtime.    Historical Provider, MD  mometasone (NASONEX) 50 MCG/ACT nasal spray Place 2 sprays into the nose daily after breakfast.     Historical Provider, MD  neomycin-bacitracin-polymyxin  (NEOSPORIN) [phone] ointment Apply 1 application topically as needed (for wound care).     Historical Provider, MD  Nutritional Supplements (NUTRITIONAL SHAKE PO) Take 1 Can by mouth 3 (three) times daily. Mighty Shakes    Historical Provider, MD  sertraline (ZOLOFT) 50 MG tablet Take 1 tablet (50 mg total) by mouth daily. Patient taking differently: Take 75 mg by mouth daily with breakfast.  03/06/13   Levert Feinstein, MD  simvastatin (ZOCOR) 10 MG tablet Take 10 mg by mouth at bedtime.    Historical Provider, MD  Skin Protectants, Misc. (MINERIN) CREA Apply 1 application topically at bedtime. Applies to whole body    Historical Provider, MD  Travoprost, BAK Free, (TRAVATAN Z) 0.004 % SOLN ophthalmic solution Place 1 drop into both eyes at bedtime.    Historical Provider, MD    Family History Family History  Problem Relation Age of Onset  . Cancer Mother     lung  . Hypertension Sister   . Thyroid disease Sister   . Hypertension Brother   . Diabetes Brother     Social History Social History  Substance Use Topics  . Smoking status: Never Smoker  . Smokeless tobacco: Never Used  . Alcohol use No     Allergies   Patient has no known allergies.   Review of Systems Review of Systems  Unable to perform ROS: Dementia     Physical Exam Updated Vital Signs BP 137/77   Pulse 72   Temp 98.5 F (36.9 C) (Oral)   Resp 14   Ht 5\' 2"  (1.575 m)   Wt 140 lb (63.5 kg)   SpO2 100%   BMI 25.61 kg/m   Physical Exam  Constitutional: She appears well-developed.  Appears older than stated age.  She is elderly and frail in appearance.  HENT:  Head: Normocephalic and atraumatic.  Eyes: Conjunctivae and EOM are normal. Pupils are equal, round, and reactive to light.  Neck: Normal range of motion and phonation normal. Neck supple.  Cardiovascular: Normal rate and regular rhythm.   Pulmonary/Chest: Effort normal and breath sounds normal. She exhibits no tenderness.  Abdominal: Soft. She  exhibits no distension. There is no tenderness. There is no guarding.  Musculoskeletal:  Normal passive range of motion arms and legs bilaterally.  Neurological: She is alert. She exhibits normal muscle tone.  She is aware of examiner's presence and tends to be cooperative during attempts at physical examination.  She is nonverbal.  Skin: Skin is warm and dry.  Psychiatric: She has a normal mood and affect. Her behavior is normal.  Nursing note and vitals reviewed.    ED Treatments / Results  Labs (all labs ordered are listed, but only abnormal results are displayed) Labs Reviewed  COMPREHENSIVE METABOLIC PANEL - Abnormal; Notable for the following:  Result Value   Glucose, Bld 124 (*)    Creatinine, Ser 1.18 (*)    Total Protein 6.3 (*)    ALT 13 (*)    GFR calc non Af Amer 49 (*)    GFR calc Af Amer 56 (*)    All other components within normal limits  CBC - Abnormal; Notable for the following:    Hemoglobin 10.1 (*)    HCT 32.3 (*)    MCH 25.1 (*)    All other components within normal limits  URINALYSIS, ROUTINE W REFLEX MICROSCOPIC - Abnormal; Notable for the following:    APPearance HAZY (*)    Hgb urine dipstick SMALL (*)    Protein, ur 30 (*)    Leukocytes, UA LARGE (*)    Bacteria, UA FEW (*)    Squamous Epithelial / LPF 0-5 (*)    Non Squamous Epithelial 0-5 (*)    All other components within normal limits  VALPROIC ACID LEVEL - Abnormal; Notable for the following:    Valproic Acid Lvl 44 (*)    All other components within normal limits  URINE CULTURE    EKG  EKG Interpretation None       Radiology No results found.  Procedures Procedures (including critical care time)  Medications Ordered in ED Medications  cephALEXin (KEFLEX) capsule 250 mg (not administered)     Initial Impression / Assessment and Plan / ED Course  I have reviewed the triage vital signs and the nursing notes.  Pertinent labs & imaging results that were available during  my care of the patient were reviewed by me and considered in my medical decision making (see chart for details).     Medications  cephALEXin (KEFLEX) capsule 250 mg (not administered)    Patient Vitals for the past 24 hrs:  BP Temp Temp src Pulse Resp SpO2 Height Weight  07/27/16 1700 137/77 - - - 14 - - -  07/27/16 1630 144/69 - - 72 17 100 % - -  07/27/16 1628 144/69 98.5 F (36.9 C) Oral 72 17 100 % - -  07/27/16 1627 - - - - - - 5\' 2"  (1.575 m) 140 lb (63.5 kg)  07/27/16 1625 - - - - - 98 % - -    6:44 PM Reevaluation with update and discussion. After initial assessment and treatment, an updated evaluation reveals she remains calm and comfortable; no problems during ED stay. Timesha Cervantez L    Final Clinical Impressions(s) / ED Diagnoses   Final diagnoses:  Alzheimer's dementia with behavioral disturbance, unspecified timing of dementia onset  Acute cystitis without hematuria    Chronic agitation with dementia.  Possible UTI.  Start on oral antibiotics.  She needs to be weaned off Depakote, after another 4 week of treatment with Depakote.  Doubt serious bacterial infection metabolic instability or impending vascular collapse.  Nursing Notes Reviewed/ Care Coordinated Applicable Imaging Reviewed Interpretation of Laboratory Data incorporated into ED treatment  The patient appears reasonably screened and/or stabilized for discharge and I doubt any other medical condition or other [facility] requiring further screening, evaluation, or treatment in the ED at this time prior to discharge.  Plan: Home Medications-continue usual medication; Home Treatments-rest; return here if the recommended treatment, does not improve the symptoms; Recommended follow up-PCP 1 week and neurology in 4 weeks   New Prescriptions New Prescriptions   CEPHALEXIN (KEFLEX) 250 MG CAPSULE    Take 1 capsule (250 mg total) by mouth 4 (four) times daily.  Mancel Bale, MD 07/27/16 [phone]

## 2016-07-29 LAB — URINE CULTURE: Culture: NO GROWTH

## 2016-07-30 ENCOUNTER — Emergency Department (HOSPITAL_COMMUNITY): Payer: Medicare HMO

## 2016-07-30 ENCOUNTER — Encounter (HOSPITAL_COMMUNITY): Payer: Self-pay | Admitting: *Deleted

## 2016-07-30 ENCOUNTER — Emergency Department (HOSPITAL_COMMUNITY)
Admission: EM | Admit: 2016-07-30 | Discharge: 2016-07-30 | Disposition: A | Discharge status: Home / Self Care | Payer: Medicare HMO | Attending: Emergency Medicine | Admitting: Emergency Medicine

## 2016-07-30 DIAGNOSIS — W19XXXA Unspecified fall, initial encounter: Secondary | ICD-10-CM | POA: Insufficient documentation

## 2016-07-30 DIAGNOSIS — S0093XA Contusion of unspecified part of head, initial encounter: Secondary | ICD-10-CM | POA: Insufficient documentation

## 2016-07-30 DIAGNOSIS — G309 Alzheimer's disease, unspecified: Secondary | ICD-10-CM | POA: Diagnosis not present

## 2016-07-30 DIAGNOSIS — I10 Essential (primary) hypertension: Secondary | ICD-10-CM | POA: Insufficient documentation

## 2016-07-30 DIAGNOSIS — Y939 Activity, unspecified: Secondary | ICD-10-CM | POA: Insufficient documentation

## 2016-07-30 DIAGNOSIS — Z79899 Other long term (current) drug therapy: Secondary | ICD-10-CM | POA: Insufficient documentation

## 2016-07-30 DIAGNOSIS — S0990XA Unspecified injury of head, initial encounter: Secondary | ICD-10-CM | POA: Diagnosis present

## 2016-07-30 DIAGNOSIS — Y999 Unspecified external cause status: Secondary | ICD-10-CM | POA: Insufficient documentation

## 2016-07-30 DIAGNOSIS — Y92129 Unspecified place in nursing home as the place of occurrence of the external cause: Secondary | ICD-10-CM | POA: Insufficient documentation

## 2016-07-30 NOTE — ED Notes (Signed)
Patient ambulated down hallway. EDP aware.

## 2016-07-30 NOTE — ED Triage Notes (Signed)
[facility] EMS[facility] found conscious in hallway on the floor. Pt is from memory care unit. Staff report pt is at normal baseline cognitively.

## 2016-07-30 NOTE — ED Notes (Signed)
Bed: [id number] Expected date:  Expected time:  Means of arrival:  Comments: 61 unwitnessed fall

## 2016-07-30 NOTE — ED Notes (Signed)
[facility] called for transportation  

## 2016-07-30 NOTE — ED Notes (Signed)
Bed: WHALB Expected date:  Expected time:  Means of arrival:  Comments: 

## 2016-07-30 NOTE — ED Notes (Signed)
Patient transported to CT 

## 2016-07-30 NOTE — ED Provider Notes (Signed)
[facility]-EMERGENCY DEPT Provider Note   CSN: [id number] Arrival date & time: 07/30/16  1515     History   Chief Complaint Chief Complaint  Patient presents with  . Fall    unwitnessed    HPI Alexandria Lowe is a 60 y.o. female.  Patient arrives via EMS after an unwitnessed fall at her memory care unit. Patient has a history of dementia and is normally non-verbal. Patient with rolled towel in use for cervical spine precautions. Patient was seen on 07/27/16 for agitation/UTI.   The history is provided by the EMS personnel. The history is limited by the condition of the patient. No language interpreter was used.  Fall  This is a new problem. The current episode started 1 to 2 hours ago. The problem has not changed since onset.   Past Medical History:  Diagnosis Date  . Alzheimer's disease 06/17/2014  . Convulsions/seizures (HCC) 06/17/2014  . Depression   . Hyperlipidemia   . Hypertension   . Nonverbal    "for awhile now"/sister (06/01/2015)    Patient Active Problem List   Diagnosis Date Noted  . Altered mental state   . Fracture of right humerus 11/05/2015  . Anemia 11/05/2015  . Essential hypertension 11/05/2015  . Hyperlipidemia 11/05/2015  . "walking corpse" syndrome   . Seizures (HCC) 11/04/2015  . Contusion   . Altered mental status 06/01/2015  . Seizure (HCC)   . Alzheimer's disease 06/17/2014  . Convulsions/seizures (HCC) 06/17/2014  . Hypertensive urgency 03/10/2013  . Hypokalemia 03/10/2013  . Pre-syncope 03/10/2013  . Dementia     Past Surgical History:  Procedure Laterality Date  . DILATION AND CURETTAGE OF UTERUS    . VAGINAL HYSTERECTOMY      OB History    No data available       Home Medications    Prior to Admission medications   Medication Sig Start Date End Date Taking? Authorizing Provider  acetaminophen (TYLENOL) 500 MG tablet Take 500 mg by mouth every 4 (four) hours as needed for mild pain, moderate pain, fever or headache.      Historical Provider, MD  alum & mag hydroxide-simeth (MINTOX) 200-200-20 MG/5ML suspension Take 30 mLs by mouth as needed for indigestion or heartburn.     Historical Provider, MD  brimonidine (ALPHAGAN P) 0.1 % SOLN Place 1 drop into both eyes 2 (two) times daily.    Historical Provider, MD  brinzolamide (AZOPT) 1 % ophthalmic suspension Place 1 drop into both eyes 2 (two) times daily.    Historical Provider, MD  Calcium Carbonate-Vitamin D (CALCIUM-D) 600-400 MG-UNIT TABS Take 1 tablet by mouth daily with breakfast.     Historical Provider, MD  cephALEXin (KEFLEX) 250 MG capsule Take 1 capsule (250 mg total) by mouth 4 (four) times daily. 07/27/16   Mancel Bale, MD  divalproex (DEPAKOTE) 125 MG DR tablet 2 capsules twice a day for 2 weeks, then take 4 capsules twice a day 07/03/16   York Spaniel, MD  guaiFENesin (ROBITUSSIN) 100 MG/5ML liquid Take 200 mg by mouth every 6 (six) hours as needed for cough.    Historical Provider, MD  levETIRAcetam (KEPPRA) 1000 MG tablet Take 1 tablet (1,000 mg total) by mouth 2 (two) times daily. 07/17/16   York Spaniel, MD  lisinopril-hydrochlorothiazide (PRINZIDE,ZESTORETIC) 20-12.5 MG tablet Take 1 tablet by mouth daily with breakfast.     Historical Provider, MD  loperamide (IMODIUM) 2 MG capsule Take 2 mg by mouth as needed for diarrhea  or loose stools.    Historical Provider, MD  loratadine (CLARITIN) 10 MG tablet Take 10 mg by mouth daily with breakfast.     Historical Provider, MD  LORazepam (ATIVAN) 0.5 MG tablet Take 0.5 mg by mouth every 8 (eight) hours as needed for anxiety.    Historical Provider, MD  magnesium hydroxide (MILK OF MAGNESIA) 400 MG/5ML suspension Take 30 mLs by mouth at bedtime as needed for mild constipation.    Historical Provider, MD  mirtazapine (REMERON) 30 MG tablet Take 30 mg by mouth at bedtime.    Historical Provider, MD  mometasone (NASONEX) 50 MCG/ACT nasal spray Place 2 sprays into the nose daily after breakfast.      Historical Provider, MD  neomycin-bacitracin-polymyxin (NEOSPORIN) [phone] ointment Apply 1 application topically as needed (for wound care).     Historical Provider, MD  Nutritional Supplements (NUTRITIONAL SHAKE PO) Take 1 Can by mouth 3 (three) times daily. Mighty Shakes    Historical Provider, MD  sertraline (ZOLOFT) 50 MG tablet Take 1 tablet (50 mg total) by mouth daily. Patient taking differently: Take 75 mg by mouth daily with breakfast.  03/06/13   Levert Feinstein, MD  simvastatin (ZOCOR) 10 MG tablet Take 10 mg by mouth at bedtime.    Historical Provider, MD  Skin Protectants, Misc. (MINERIN) CREA Apply 1 application topically at bedtime. Applies to whole body    Historical Provider, MD  Travoprost, BAK Free, (TRAVATAN Z) 0.004 % SOLN ophthalmic solution Place 1 drop into both eyes at bedtime.    Historical Provider, MD    Family History Family History  Problem Relation Age of Onset  . Cancer Mother     lung  . Hypertension Sister   . Thyroid disease Sister   . Hypertension Brother   . Diabetes Brother     Social History Social History  Substance Use Topics  . Smoking status: Never Smoker  . Smokeless tobacco: Never Used  . Alcohol use No     Allergies   Patient has no known allergies.   Review of Systems Review of Systems  Unable to perform ROS: Patient nonverbal     Physical Exam Updated Vital Signs There were no vitals taken for this visit.  Physical Exam  Constitutional: She appears well-developed.  HENT:  Mild bruising to right side of face/head.  Eyes: Conjunctivae are normal.  Neck: Neck supple. No JVD present.  Cardiovascular: Normal rate and regular rhythm.   Pulmonary/Chest: Effort normal and breath sounds normal. No respiratory distress.  Abdominal: Bowel sounds are normal.  Musculoskeletal: She exhibits no edema or deformity.  Normal passive range of motion of extremities. No deformity or bruising noted.   Skin: Skin is warm and dry.    Psychiatric: She is noncommunicative.     ED Treatments / Results  Labs (all labs ordered are listed, but only abnormal results are displayed) Labs Reviewed - No data to display  EKG  EKG Interpretation None       Radiology Ct Head Wo Contrast  Result Date: 07/30/2016 CLINICAL DATA:  61 year old female status post unwitnessed fall, found conscious in hallway on floor today. Initial encounter. EXAM: CT HEAD WITHOUT CONTRAST CT CERVICAL SPINE WITHOUT CONTRAST TECHNIQUE: Multidetector CT imaging of the head and cervical spine was performed following the standard protocol without intravenous contrast. Multiplanar CT image reconstructions of the cervical spine were also generated. COMPARISON:  Head and cervical spine CT 05/02/2016 and earlier. FINDINGS: CT HEAD FINDINGS Brain: Occasional motion artifact. Cerebral  volume appears stable. No midline shift, ventriculomegaly, mass effect, evidence of mass lesion, intracranial hemorrhage or evidence of cortically based acute infarction. Gray-white matter differentiation is stable and normal for age. Vascular: Mild Calcified atherosclerosis at the skull base. Skull: No skull fracture identified. Sinuses/Orbits: Visualized paranasal sinuses and mastoids are stable and well pneumatized. Other: No scalp hematoma identified. No acute scalp or orbits soft tissue finding. CT CERVICAL SPINE FINDINGS Intermittent motion artifact degrading bone detail, including at the C1, odontoid, C7 and T1 level. Alignment: Reversal of cervical lordosis is new since December. Cervicothoracic junction and bilateral posterior element alignment appears to remain intact. Skull base and vertebrae: Visualized skull base is intact. No atlanto-occipital dissociation. C1-C2 alignment is within normal limits. The C1 and C2 levels appear intact allowing for mild motion artifact. No convincing acute cervical spine fracture. There is a small right C7 cervical rib. Soft tissues and spinal  canal: No prevertebral fluid or swelling. No visible canal hematoma. Negative noncontrast neck soft tissues. Disc levels: Intermittent chronic disc and endplate degeneration in the cervical spine. No definite cervical spinal stenosis. Upper chest: Grossly intact visualized upper thoracic levels allowing for some motion. Negative lung apices. IMPRESSION: 1. Stable and negative for age non contrast CT appearance of the brain. 2. Reversal of cervical lordosis is new since December but no acute fracture or listhesis is identified in the cervical spine. Ligamentous injury is not excluded. Electronically Signed   By: Odessa Fleming M.D.   On: 07/30/2016 16:51   Ct Cervical Spine Wo Contrast  Result Date: 07/30/2016 CLINICAL DATA:  61 year old female status post unwitnessed fall, found conscious in hallway on floor today. Initial encounter. EXAM: CT HEAD WITHOUT CONTRAST CT CERVICAL SPINE WITHOUT CONTRAST TECHNIQUE: Multidetector CT imaging of the head and cervical spine was performed following the standard protocol without intravenous contrast. Multiplanar CT image reconstructions of the cervical spine were also generated. COMPARISON:  Head and cervical spine CT 05/02/2016 and earlier. FINDINGS: CT HEAD FINDINGS Brain: Occasional motion artifact. Cerebral volume appears stable. No midline shift, ventriculomegaly, mass effect, evidence of mass lesion, intracranial hemorrhage or evidence of cortically based acute infarction. Gray-white matter differentiation is stable and normal for age. Vascular: Mild Calcified atherosclerosis at the skull base. Skull: No skull fracture identified. Sinuses/Orbits: Visualized paranasal sinuses and mastoids are stable and well pneumatized. Other: No scalp hematoma identified. No acute scalp or orbits soft tissue finding. CT CERVICAL SPINE FINDINGS Intermittent motion artifact degrading bone detail, including at the C1, odontoid, C7 and T1 level. Alignment: Reversal of cervical lordosis is new  since December. Cervicothoracic junction and bilateral posterior element alignment appears to remain intact. Skull base and vertebrae: Visualized skull base is intact. No atlanto-occipital dissociation. C1-C2 alignment is within normal limits. The C1 and C2 levels appear intact allowing for mild motion artifact. No convincing acute cervical spine fracture. There is a small right C7 cervical rib. Soft tissues and spinal canal: No prevertebral fluid or swelling. No visible canal hematoma. Negative noncontrast neck soft tissues. Disc levels: Intermittent chronic disc and endplate degeneration in the cervical spine. No definite cervical spinal stenosis. Upper chest: Grossly intact visualized upper thoracic levels allowing for some motion. Negative lung apices. IMPRESSION: 1. Stable and negative for age non contrast CT appearance of the brain. 2. Reversal of cervical lordosis is new since December but no acute fracture or listhesis is identified in the cervical spine. Ligamentous injury is not excluded. Electronically Signed   By: Odessa Fleming M.D.   On:  07/30/2016 16:51    Procedures Procedures (including critical care time)  Medications Ordered in ED Medications - No data to display   Initial Impression / Assessment and Plan / ED Course  I have reviewed the triage vital signs and the nursing notes.  Pertinent labs & imaging results that were available during my care of the patient were reviewed by me and considered in my medical decision making (see chart for details).     Radiology results reviewed.  Patient awake, makes eye contact, follows commands. She is able to ambulate in the ED with minimal assistance. Patient discussed with Dr. Freida BusmanAllen. Will discharge to her care facility.  Final Clinical Impressions(s) / ED Diagnoses   Final diagnoses:  Fall at nursing home, initial encounter    New Prescriptions New Prescriptions   No medications on file     Felicie MornDavid Iantha Titsworth, NP 07/30/16 2121    Felicie Mornavid  Treina Arscott, NP 08/06/16 1531    Lorre NickAnthony Allen, MD 08/08/16 1336

## 2016-08-10 ENCOUNTER — Emergency Department (HOSPITAL_COMMUNITY)
Admission: EM | Admit: 2016-08-10 | Discharge: 2016-08-11 | Disposition: A | Discharge status: Home / Self Care | Payer: Medicare HMO | Attending: Emergency Medicine | Admitting: Emergency Medicine

## 2016-08-10 ENCOUNTER — Emergency Department (HOSPITAL_COMMUNITY): Payer: Medicare HMO

## 2016-08-10 ENCOUNTER — Encounter (HOSPITAL_COMMUNITY): Payer: Self-pay | Admitting: Emergency Medicine

## 2016-08-10 DIAGNOSIS — Y939 Activity, unspecified: Secondary | ICD-10-CM | POA: Diagnosis not present

## 2016-08-10 DIAGNOSIS — Z79899 Other long term (current) drug therapy: Secondary | ICD-10-CM | POA: Insufficient documentation

## 2016-08-10 DIAGNOSIS — I10 Essential (primary) hypertension: Secondary | ICD-10-CM | POA: Insufficient documentation

## 2016-08-10 DIAGNOSIS — G309 Alzheimer's disease, unspecified: Secondary | ICD-10-CM | POA: Diagnosis not present

## 2016-08-10 DIAGNOSIS — D649 Anemia, unspecified: Secondary | ICD-10-CM | POA: Insufficient documentation

## 2016-08-10 DIAGNOSIS — W19XXXA Unspecified fall, initial encounter: Secondary | ICD-10-CM | POA: Insufficient documentation

## 2016-08-10 DIAGNOSIS — Y92129 Unspecified place in nursing home as the place of occurrence of the external cause: Secondary | ICD-10-CM | POA: Insufficient documentation

## 2016-08-10 DIAGNOSIS — S0003XA Contusion of scalp, initial encounter: Secondary | ICD-10-CM | POA: Diagnosis not present

## 2016-08-10 DIAGNOSIS — S0990XA Unspecified injury of head, initial encounter: Secondary | ICD-10-CM | POA: Diagnosis present

## 2016-08-10 DIAGNOSIS — Y999 Unspecified external cause status: Secondary | ICD-10-CM | POA: Insufficient documentation

## 2016-08-10 NOTE — ED Notes (Signed)
Bed: WA04 Expected date:  Expected time:  Means of arrival:  Comments: EMS 

## 2016-08-10 NOTE — ED Triage Notes (Signed)
Per EMS, [facility] Fall was unwitnessed. Pt has hematoma on right forehead. No other injuries noted. Hx of dementia. Pt walks with assistance. CBG 90

## 2016-08-10 NOTE — ED Provider Notes (Signed)
[facility]-EMERGENCY DEPT Provider Note   CSN: [id number] Arrival date & time: 08/10/16  2210   By signing my name below, I, Clarisse Gouge, attest that this documentation has been prepared under the direction and in the presence of [address] Krishana Lutze, [employer]. Electronically Signed: Clarisse Gouge, Scribe. 08/10/16. 10:55 PM.   History   Chief Complaint Chief Complaint  Patient presents with  . Fall    The history is provided by medical records and the EMS personnel. The history is limited by the condition of the patient. No language interpreter was used.  Facial Injury  Mechanism of injury:  Fall Location:  Forehead Pain details:    Quality:  Unable to specify   Severity:  Unable to specify   Timing:  Unable to specify   Progression:  Unable to specify Foreign body present:  No foreign bodies   HPI Comments: Alexandria Lowe is a 61 y.o. female with a PMHx of alzheimer's dementia, HTN, HLD, and nonverbal at baseline, BIB EMS from [place] who presents to the Emergency Department s/p an unwitnessed fall. LEVEL 5 CAVEAT D/T DEMENTIA. Triage states the pt was found behind the door of another pt at her nursing home. Hematoma noted to forehead. Pt reportedly ambulatory with assistance at baseline. Pt unable to provide any additional information, responds to all questions by saying "yeah". She otherwise mumbles indistinguishably. HPI/ROS limited due to dementia.   Past Medical History:  Diagnosis Date  . Alzheimer's disease 06/17/2014  . Convulsions/seizures (HCC) 06/17/2014  . Depression   . Hyperlipidemia   . Hypertension   . Nonverbal    "for awhile now"/sister (06/01/2015)    Patient Active Problem List   Diagnosis Date Noted  . Altered mental state   . Fracture of right humerus 11/05/2015  . Anemia 11/05/2015  . Essential hypertension 11/05/2015  . Hyperlipidemia 11/05/2015  . "walking corpse" syndrome   . Seizures (HCC) 11/04/2015  . Contusion   . Altered mental status  06/01/2015  . Seizure (HCC)   . Alzheimer's disease 06/17/2014  . Convulsions/seizures (HCC) 06/17/2014  . Hypertensive urgency 03/10/2013  . Hypokalemia 03/10/2013  . Pre-syncope 03/10/2013  . Dementia     Past Surgical History:  Procedure Laterality Date  . DILATION AND CURETTAGE OF UTERUS    . VAGINAL HYSTERECTOMY      OB History    No data available       Home Medications    Prior to Admission medications   Medication Sig Start Date End Date Taking? Authorizing Provider  acetaminophen (TYLENOL) 500 MG tablet Take 500 mg by mouth every 4 (four) hours as needed for mild pain, moderate pain, fever or headache.    Yes Historical Provider, MD  alum & mag hydroxide-simeth (MINTOX) 200-200-20 MG/5ML suspension Take 30 mLs by mouth as needed for indigestion or heartburn.    Yes Historical Provider, MD  brimonidine (ALPHAGAN P) 0.1 % SOLN Place 1 drop into both eyes 2 (two) times daily.   Yes Historical Provider, MD  brinzolamide (AZOPT) 1 % ophthalmic suspension Place 1 drop into both eyes 2 (two) times daily.   Yes Historical Provider, MD  Calcium Carbonate-Vitamin D (CALCIUM-D) 600-400 MG-UNIT TABS Take 1 tablet by mouth daily with breakfast.    Yes Historical Provider, MD  divalproex (DEPAKOTE) 125 MG DR tablet 2 capsules twice a day for 2 weeks, then take 4 capsules twice a day Patient taking differently: Take 500 mg by mouth 2 (two) times daily.  07/03/16  Yes  [place] Spaniel, MD  guaiFENesin (ROBITUSSIN) 100 MG/5ML liquid Take 200 mg by mouth every 6 (six) hours as needed for cough.   Yes Historical Provider, MD  levETIRAcetam (KEPPRA) 1000 MG tablet Take 1 tablet (1,000 mg total) by mouth 2 (two) times daily. 07/17/16  Yes [place] Spaniel, MD  lisinopril-hydrochlorothiazide (PRINZIDE,ZESTORETIC) 20-12.5 MG tablet Take 1 tablet by mouth daily with breakfast.    Yes Historical Provider, MD  loperamide (IMODIUM) 2 MG capsule Take 2 mg by mouth as needed for diarrhea or loose  stools.   Yes Historical Provider, MD  loratadine (CLARITIN) 10 MG tablet Take 10 mg by mouth daily with breakfast.    Yes Historical Provider, MD  LORazepam (ATIVAN) 0.5 MG tablet Take 0.5 mg by mouth every 8 (eight) hours as needed for anxiety.   Yes Historical Provider, MD  magnesium hydroxide (MILK OF MAGNESIA) 400 MG/5ML suspension Take 30 mLs by mouth at bedtime as needed for mild constipation.   Yes Historical Provider, MD  mirtazapine (REMERON) 30 MG tablet Take 30 mg by mouth at bedtime.   Yes Historical Provider, MD  mometasone (NASONEX) 50 MCG/ACT nasal spray Place 2 sprays into the nose daily after breakfast.    Yes Historical Provider, MD  neomycin-bacitracin-polymyxin (NEOSPORIN) [phone] ointment Apply 1 application topically as needed (for wound care).    Yes Historical Provider, MD  Nutritional Supplements (NUTRITIONAL SHAKE PO) Take 1 Can by mouth 3 (three) times daily. Mighty Shakes   Yes Historical Provider, MD  sertraline (ZOLOFT) 50 MG tablet Take 1 tablet (50 mg total) by mouth daily. Patient taking differently: Take 75 mg by mouth daily with breakfast.  03/06/13  Yes Levert Feinstein, MD  simvastatin (ZOCOR) 10 MG tablet Take 10 mg by mouth at bedtime.   Yes Historical Provider, MD  Skin Protectants, Misc. (MINERIN) CREA Apply 1 application topically at bedtime. Applies to whole body   Yes Historical Provider, MD  Travoprost, BAK Free, (TRAVATAN Z) 0.004 % SOLN ophthalmic solution Place 1 drop into both eyes at bedtime.   Yes Historical Provider, MD  cephALEXin (KEFLEX) 250 MG capsule Take 1 capsule (250 mg total) by mouth 4 (four) times daily. Patient not taking: Reported on 08/10/2016 07/27/16   Mancel Bale, MD    Family History Family History  Problem Relation Age of Onset  . Cancer Mother     lung  . Hypertension Sister   . Thyroid disease Sister   . Hypertension Brother   . Diabetes Brother     Social History Social History  Substance Use Topics  . Smoking  status: Never Smoker  . Smokeless tobacco: Never Used  . Alcohol use No     Allergies   Patient has no known allergies.   Review of Systems Review of Systems  Unable to perform ROS: Dementia  Allergic/Immunologic: Negative for immunocompromised state.  Level 5 caveat due to dementia   Physical Exam Updated Vital Signs BP (!) 164/85 (BP Location: Left Arm)   Pulse (!) 55   Temp 98 F (36.7 C) (Oral)   Resp 17   Ht 5\' 2"  (1.575 m)   Wt 140 lb (63.5 kg)   SpO2 97%   BMI 25.61 kg/m   Physical Exam  Constitutional: Vital signs are normal. She appears well-developed and well-nourished.  Non-toxic appearance. No distress.  Afebrile, nontoxic, NAD  HENT:  Head: Normocephalic. Head is with contusion. Head is without raccoon's eyes, without Battle's sign, without abrasion and without laceration.  Right Ear: Hearing, tympanic membrane, external ear and ear canal normal.  Left Ear: Hearing, tympanic membrane, external ear and ear canal normal.  Nose: Nose normal.  Mouth/Throat: Oropharynx is clear and moist and mucous membranes are normal.  Large R forehead hematoma, no scalp crepitus or apparent tenderness, no bony instability, no raccoon eyes or battle signs, no abrasions or lacerations, no hemotympanum, no S/Sx of basilar skull fracture  Eyes: Conjunctivae and EOM are normal. Pupils are equal, round, and reactive to light. Right eye exhibits no discharge. Left eye exhibits no discharge.  PERRL, EOMI, does not cooperate with full exam  Neck: Normal range of motion. Neck supple. No spinous process tenderness and no muscular tenderness present. No neck rigidity. Normal range of motion present.  FROM intact without apparent spinous process TTP, no bony stepoffs or deformities, no paraspinous muscle TTP or muscle spasms. No rigidity or meningeal signs. No bruising or swelling.   Cardiovascular: Normal rate, regular rhythm, normal heart sounds and intact distal pulses.  Exam reveals no  gallop and no friction rub.   No murmur heard. Pulmonary/Chest: Effort normal and breath sounds normal. No respiratory distress. She has no decreased breath sounds. She has no wheezes. She has no rhonchi. She has no rales.  Abdominal: Soft. Normal appearance and bowel sounds are normal. She exhibits no distension. There is no tenderness. There is no rigidity, no rebound, no guarding, no CVA tenderness, no tenderness at McBurney's point and negative Murphy's sign.  Musculoskeletal: Normal range of motion.  MAE x4 Strength and sensation grossly intact in all extremities Distal pulses intact No pelvic instability, no areas of tenderness or evidence of trauma to all extremities  Neurological: She is alert. She has normal strength. No sensory deficit.  Baseline dementia, does not cooperate with full neuro exam  Skin: Skin is warm, dry and intact. No rash noted.  Psychiatric: She has a normal mood and affect.  Nursing note and vitals reviewed.    ED Treatments / Results  DIAGNOSTIC STUDIES: Oxygen Saturation is 97% on RA, normal by my interpretation.    COORDINATION OF CARE: 10:53 PM Discussed treatment plan with pt at bedside and pt agreed to plan. Will order imaging and labs.  Labs (all labs ordered are listed, but only abnormal results are displayed) Labs Reviewed  CBC WITH DIFFERENTIAL/PLATELET - Abnormal; Notable for the following:       Result Value   Hemoglobin 10.6 (*)    HCT 32.6 (*)    MCH 25.9 (*)    RDW 16.1 (*)    Platelets 142 (*)    All other components within normal limits  COMPREHENSIVE METABOLIC PANEL - Abnormal; Notable for the following:    BUN 24 (*)    Creatinine, Ser 1.04 (*)    Calcium 8.7 (*)    GFR calc non Af Amer 57 (*)    All other components within normal limits  URINALYSIS, ROUTINE W REFLEX MICROSCOPIC - Abnormal; Notable for the following:    Ketones, ur 5 (*)    All other components within normal limits  PROTIME-INR  I-STAT CG4 LACTIC ACID, ED     EKG  EKG Interpretation  Date/Time:  Friday August 10 2016 23:18:04 EDT Ventricular Rate:  59 PR Interval:    QRS Duration: 109 QT Interval:  454 QTC Calculation: 450 R Axis:   65 Text Interpretation:  Sinus rhythm Abnormal R-wave progression, early transition Borderline repolarization abnormality No acute changes No significant change since last tracing Confirmed  by Rhunette Croft, MD, Janey Genta [phone]) on 08/11/2016 2:59:05 AM       Radiology Dg Chest 2 View  Result Date: 08/10/2016 CLINICAL DATA:  61 year old female with fall and trauma to the forehead. EXAM: CHEST  2 VIEW COMPARISON:  Chest radiograph dated 11/04/2015 FINDINGS: The lungs are clear. There is no pleural effusion or pneumothorax. The cardiac silhouette is within normal limits. There is osteopenia with degenerative changes of the spine. Old fracture deformity of the right humeral neck, incompletely evaluated on this chest radiograph. Clinical correlation is recommended. IMPRESSION: No active cardiopulmonary disease. Electronically Signed   By: Elgie Collard M.D.   On: 08/10/2016 23:30   Ct Head Wo Contrast  Result Date: 08/10/2016 CLINICAL DATA:  Unwitnessed fall. Hematoma of the right forehead. History of dementia. EXAM: CT HEAD WITHOUT CONTRAST CT CERVICAL SPINE WITHOUT CONTRAST TECHNIQUE: Multidetector CT imaging of the head and cervical spine was performed following the standard protocol without intravenous contrast. Multiplanar CT image reconstructions of the cervical spine were also generated. COMPARISON:  07/30/2016 FINDINGS: CT HEAD FINDINGS Brain: Examination is limited due to motion artifact. Diffuse cerebral atrophy. Ventricular dilatation consistent with central atrophy. Low-attenuation changes in the periventricular white matter consistent with small vessel ischemic change. No mass-effect or midline shift. No abnormal extra-axial fluid collections. Gray-white matter junctions are distinct. Basal cisterns are not  effaced. No acute intracranial hemorrhage. Vascular: No hyperdense vessel or unexpected calcification. Skull: Calvarium appears intact. Sinuses/Orbits: No acute finding. Other: Right frontal subcutaneous scalp hematoma. CT CERVICAL SPINE FINDINGS Alignment: Examination is limited due to motion artifact. Alignment of the cervical spine appears normal. Skull base and vertebrae: No acute fracture. No primary bone lesion or focal pathologic process. Soft tissues and spinal canal: No prevertebral fluid or swelling. No visible canal hematoma. Disc levels: Degenerative changes in the cervical spine with mild disc space narrowing and endplate hypertrophic changes most prominent at C5-6, C6-7, and C7-T1 levels. Upper chest: Negative. Other: Cervical ribs at C7, more prominent on the right. IMPRESSION: No acute intracranial abnormalities. Chronic atrophy and small vessel ischemic changes. Normal alignment of the cervical spine. No acute displaced fractures are identified. Degenerative changes. Examination is limited due to motion artifact. Electronically Signed   By: Burman Nieves M.D.   On: 08/10/2016 23:41   Ct Cervical Spine Wo Contrast  Result Date: 08/10/2016 CLINICAL DATA:  Unwitnessed fall. Hematoma of the right forehead. History of dementia. EXAM: CT HEAD WITHOUT CONTRAST CT CERVICAL SPINE WITHOUT CONTRAST TECHNIQUE: Multidetector CT imaging of the head and cervical spine was performed following the standard protocol without intravenous contrast. Multiplanar CT image reconstructions of the cervical spine were also generated. COMPARISON:  07/30/2016 FINDINGS: CT HEAD FINDINGS Brain: Examination is limited due to motion artifact. Diffuse cerebral atrophy. Ventricular dilatation consistent with central atrophy. Low-attenuation changes in the periventricular white matter consistent with small vessel ischemic change. No mass-effect or midline shift. No abnormal extra-axial fluid collections. Gray-white matter  junctions are distinct. Basal cisterns are not effaced. No acute intracranial hemorrhage. Vascular: No hyperdense vessel or unexpected calcification. Skull: Calvarium appears intact. Sinuses/Orbits: No acute finding. Other: Right frontal subcutaneous scalp hematoma. CT CERVICAL SPINE FINDINGS Alignment: Examination is limited due to motion artifact. Alignment of the cervical spine appears normal. Skull base and vertebrae: No acute fracture. No primary bone lesion or focal pathologic process. Soft tissues and spinal canal: No prevertebral fluid or swelling. No visible canal hematoma. Disc levels: Degenerative changes in the cervical spine with mild disc space narrowing and  endplate hypertrophic changes most prominent at C5-6, C6-7, and C7-T1 levels. Upper chest: Negative. Other: Cervical ribs at C7, more prominent on the right. IMPRESSION: No acute intracranial abnormalities. Chronic atrophy and small vessel ischemic changes. Normal alignment of the cervical spine. No acute displaced fractures are identified. Degenerative changes. Examination is limited due to motion artifact. Electronically Signed   By: Burman Nieves M.D.   On: 08/10/2016 23:41    Procedures Procedures (including critical care time)  Medications Ordered in ED Medications - No data to display   Initial Impression / Assessment and Plan / ED Course  I have reviewed the triage vital signs and the nursing notes.  Pertinent labs & imaging results that were available during my care of the patient were reviewed by me and considered in my medical decision making (see chart for details).     61 y.o. female here with unwitnessed fall, found behind neighbors door at [facility]. Large hematoma to R forehead, otherwise no obvious injuries. Pt unable to provide any history since she has baseline dementia, and answers "yeah" to everything. On exam, large hematoma to R forehead, no crepitus or deformity, no scalp depression, no apparent  tenderness. No midline spinal tenderness noted, no stepoffs. No pelvic instability, no evidence of trauma to all extremities. Lung sounds clear. NVI with distal pulses intact in all extremities, MAEx4. Will get basic labs , U/A, CT head/neck, EKG, and CXR to eval for injuries or any abnormalities. Will reassess shortly  3:03 AM Pt sitting up in bed, comfortable, in NAD. CBC w/diff with chronic baseline anemia. CMP with stable kidney function, otherwise unremarkable. Lactic WNL. INR WNL. U/A unremarkable, without evidence of UTI. EKG unremarkable without acute ischemic findings. CXR negative. CT head/neck without acute findings, somewhat limited due to motion artifact. Overall, benign evaluation. Will d/c back to nursing home, likely just mechanical unwitnessed fall. Advised nursing home to use ice on head, tylenol/motrin for pain, f/up with her PCP in 3-5 days for recheck of symptoms. Pt stable at time of discharge.   I personally performed the services described in this documentation, which was scribed in my presence. The recorded information has been reviewed and is accurate.   Final Clinical Impressions(s) / ED Diagnoses   Final diagnoses:  Fall, initial encounter  Contusion of scalp, initial encounter  Chronic anemia    New Prescriptions New Prescriptions   No medications on file     [address] PA-C 08/11/16 0303    Shaune Pollack, MD 08/11/16 1315

## 2016-08-11 DIAGNOSIS — S0003XA Contusion of scalp, initial encounter: Secondary | ICD-10-CM | POA: Diagnosis not present

## 2016-08-11 LAB — CBC WITH DIFFERENTIAL/PLATELET
BASOS ABS: 0 10*3/uL (ref 0.0–0.1)
BASOS PCT: 0 %
EOS ABS: 0.1 10*3/uL (ref 0.0–0.7)
EOS PCT: 2 %
HCT: 32.6 % — ABNORMAL LOW (ref 36.0–46.0)
Hemoglobin: 10.6 g/dL — ABNORMAL LOW (ref 12.0–15.0)
Lymphocytes Relative: 31 %
Lymphs Abs: 2.4 10*3/uL (ref 0.7–4.0)
MCH: 25.9 pg — ABNORMAL LOW (ref 26.0–34.0)
MCHC: 32.5 g/dL (ref 30.0–36.0)
MCV: 79.5 fL (ref 78.0–100.0)
MONO ABS: 0.8 10*3/uL (ref 0.1–1.0)
Monocytes Relative: 11 %
NEUTROS ABS: 4.2 10*3/uL (ref 1.7–7.7)
Neutrophils Relative %: 56 %
PLATELETS: 142 10*3/uL — AB (ref 150–400)
RBC: 4.1 MIL/uL (ref 3.87–5.11)
RDW: 16.1 % — AB (ref 11.5–15.5)
WBC: 7.6 10*3/uL (ref 4.0–10.5)

## 2016-08-11 LAB — COMPREHENSIVE METABOLIC PANEL
ALT: 18 U/L (ref 14–54)
AST: 27 U/L (ref 15–41)
Albumin: 3.6 g/dL (ref 3.5–5.0)
Alkaline Phosphatase: 53 U/L (ref 38–126)
Anion gap: 6 (ref 5–15)
BUN: 24 mg/dL — AB (ref 6–20)
CHLORIDE: 107 mmol/L (ref 101–111)
CO2: 28 mmol/L (ref 22–32)
CREATININE: 1.04 mg/dL — AB (ref 0.44–1.00)
Calcium: 8.7 mg/dL — ABNORMAL LOW (ref 8.9–10.3)
GFR calc Af Amer: >60 mL/min (ref >60)
GFR, EST NON AFRICAN AMERICAN: 57 mL/min — AB (ref >60)
Glucose, Bld: 89 mg/dL (ref 65–99)
POTASSIUM: 4 mmol/L (ref 3.5–5.1)
SODIUM: 141 mmol/L (ref 135–145)
Total Bilirubin: 0.5 mg/dL (ref 0.3–1.2)
Total Protein: 6.8 g/dL (ref 6.5–8.1)

## 2016-08-11 LAB — PROTIME-INR
INR: 1
PROTHROMBIN TIME: 13.2 s (ref 11.4–15.2)

## 2016-08-11 LAB — URINALYSIS, ROUTINE W REFLEX MICROSCOPIC
BILIRUBIN URINE: NEGATIVE
Glucose, UA: NEGATIVE mg/dL
HGB URINE DIPSTICK: NEGATIVE
KETONES UR: 5 mg/dL — AB
Leukocytes, UA: NEGATIVE
NITRITE: NEGATIVE
Protein, ur: NEGATIVE mg/dL
Specific Gravity, Urine: 1.025 (ref 1.005–1.030)
pH: 6 (ref 5.0–8.0)

## 2016-08-11 LAB — I-STAT CG4 LACTIC ACID, ED: LACTIC ACID, VENOUS: 1.18 mmol/L (ref 0.5–1.9)

## 2016-08-11 NOTE — ED Notes (Signed)
[facility]

## 2016-08-11 NOTE — Discharge Instructions (Signed)
Today's evaluation and work up has been unremarkable; you have a bruise on your forehead from the fall. Alternate between Ibuprofen and Tylenol for pain. Get plenty of rest, use ice on your head.  Follow Up with primary care physician in 3-5 days for recheck of symptoms.  Return to the emergency department for changes or worsening symptoms, including but not limited to: if patient becomes lethargic, begins vomiting or other change in mental status.

## 2016-08-11 NOTE — ED Notes (Signed)
[facility]

## 2016-08-11 NOTE — ED Notes (Signed)
Multiple attempts to draw blood by 2 nurses and phlebotomy tech. IV team consult requested.

## 2016-08-23 ENCOUNTER — Emergency Department (HOSPITAL_COMMUNITY): Payer: Medicare HMO

## 2016-08-23 ENCOUNTER — Encounter (HOSPITAL_COMMUNITY): Payer: Self-pay | Admitting: Emergency Medicine

## 2016-08-23 ENCOUNTER — Emergency Department (HOSPITAL_COMMUNITY)
Admission: EM | Admit: 2016-08-23 | Discharge: 2016-08-24 | Disposition: A | Discharge status: Home / Self Care | Payer: Medicare HMO | Source: Home / Self Care | Attending: Emergency Medicine | Admitting: Emergency Medicine

## 2016-08-23 ENCOUNTER — Encounter (HOSPITAL_COMMUNITY): Payer: Self-pay | Admitting: Nurse Practitioner

## 2016-08-23 ENCOUNTER — Emergency Department (HOSPITAL_COMMUNITY)
Admission: EM | Admit: 2016-08-23 | Discharge: 2016-08-23 | Disposition: A | Discharge status: Home / Self Care | Payer: Medicare HMO | Source: Home / Self Care | Attending: Emergency Medicine | Admitting: Emergency Medicine

## 2016-08-23 DIAGNOSIS — G309 Alzheimer's disease, unspecified: Secondary | ICD-10-CM | POA: Insufficient documentation

## 2016-08-23 DIAGNOSIS — M6281 Muscle weakness (generalized): Secondary | ICD-10-CM | POA: Insufficient documentation

## 2016-08-23 DIAGNOSIS — Z79899 Other long term (current) drug therapy: Secondary | ICD-10-CM

## 2016-08-23 DIAGNOSIS — R400 Somnolence: Secondary | ICD-10-CM

## 2016-08-23 DIAGNOSIS — R278 Other lack of coordination: Secondary | ICD-10-CM | POA: Insufficient documentation

## 2016-08-23 DIAGNOSIS — I10 Essential (primary) hypertension: Secondary | ICD-10-CM | POA: Diagnosis not present

## 2016-08-23 DIAGNOSIS — R2689 Other abnormalities of gait and mobility: Secondary | ICD-10-CM | POA: Diagnosis not present

## 2016-08-23 DIAGNOSIS — Z7409 Other reduced mobility: Secondary | ICD-10-CM | POA: Diagnosis not present

## 2016-08-23 DIAGNOSIS — R410 Disorientation, unspecified: Secondary | ICD-10-CM | POA: Insufficient documentation

## 2016-08-23 DIAGNOSIS — R531 Weakness: Secondary | ICD-10-CM

## 2016-08-23 DIAGNOSIS — F0390 Unspecified dementia without behavioral disturbance: Secondary | ICD-10-CM | POA: Diagnosis present

## 2016-08-23 DIAGNOSIS — F0391 Unspecified dementia with behavioral disturbance: Secondary | ICD-10-CM | POA: Diagnosis not present

## 2016-08-23 LAB — I-STAT TROPONIN, ED
Troponin i, poc: 0 ng/mL (ref 0.00–0.08)
Troponin i, poc: 0 ng/mL (ref 0.00–0.08)

## 2016-08-23 LAB — URINALYSIS, ROUTINE W REFLEX MICROSCOPIC
BILIRUBIN URINE: NEGATIVE
Glucose, UA: NEGATIVE mg/dL
HGB URINE DIPSTICK: NEGATIVE
Ketones, ur: 5 mg/dL — AB
Leukocytes, UA: NEGATIVE
Nitrite: NEGATIVE
Protein, ur: NEGATIVE mg/dL
SPECIFIC GRAVITY, URINE: 1.023 (ref 1.005–1.030)
pH: 7 (ref 5.0–8.0)

## 2016-08-23 LAB — CBC WITH DIFFERENTIAL/PLATELET
BASOS ABS: 0 10*3/uL (ref 0.0–0.1)
Basophils Absolute: 0 10*3/uL (ref 0.0–0.1)
Basophils Relative: 0 %
Basophils Relative: 0 %
Eosinophils Absolute: 0.1 10*3/uL (ref 0.0–0.7)
Eosinophils Absolute: 0.1 10*3/uL (ref 0.0–0.7)
Eosinophils Relative: 1 %
Eosinophils Relative: 1 %
HEMATOCRIT: 35.1 % — AB (ref 36.0–46.0)
HEMATOCRIT: 28.7 % — AB (ref 36.0–46.0)
HEMOGLOBIN: 11.3 g/dL — AB (ref 12.0–15.0)
HEMOGLOBIN: 9.4 g/dL — AB (ref 12.0–15.0)
LYMPHS ABS: 2.3 10*3/uL (ref 0.7–4.0)
LYMPHS PCT: 32 %
Lymphocytes Relative: 32 %
Lymphs Abs: 2.3 10*3/uL (ref 0.7–4.0)
MCH: 25.3 pg — AB (ref 26.0–34.0)
MCH: 25.7 pg — ABNORMAL LOW (ref 26.0–34.0)
MCHC: 32.2 g/dL (ref 30.0–36.0)
MCHC: 32.8 g/dL (ref 30.0–36.0)
MCV: 78.5 fL (ref 78.0–100.0)
MCV: 78.4 fL (ref 78.0–100.0)
MONOS PCT: 7 %
MONOS PCT: 7 %
Monocytes Absolute: 0.5 10*3/uL (ref 0.1–1.0)
Monocytes Absolute: 0.5 10*3/uL (ref 0.1–1.0)
NEUTROS ABS: 4.3 10*3/uL (ref 1.7–7.7)
NEUTROS ABS: 4.3 10*3/uL (ref 1.7–7.7)
NEUTROS PCT: 60 %
Neutrophils Relative %: 60 %
Platelets: 175 10*3/uL (ref 150–400)
Platelets: 153 10*3/uL (ref 150–400)
RBC: 4.47 MIL/uL (ref 3.87–5.11)
RBC: 3.66 MIL/uL — ABNORMAL LOW (ref 3.87–5.11)
RDW: 16.8 % — ABNORMAL HIGH (ref 11.5–15.5)
RDW: 16.7 % — AB (ref 11.5–15.5)
WBC: 7.3 10*3/uL (ref 4.0–10.5)
WBC: 7.2 10*3/uL (ref 4.0–10.5)

## 2016-08-23 LAB — I-STAT CHEM 8, ED
BUN: 19 mg/dL (ref 6–20)
CREATININE: 1 mg/dL (ref 0.44–1.00)
Calcium, Ion: 1.12 mmol/L — ABNORMAL LOW (ref 1.15–1.40)
Chloride: 110 mmol/L (ref 101–111)
Glucose, Bld: 87 mg/dL (ref 65–99)
HEMATOCRIT: 28 % — AB (ref 36.0–46.0)
Hemoglobin: 9.5 g/dL — ABNORMAL LOW (ref 12.0–15.0)
POTASSIUM: 3.4 mmol/L — AB (ref 3.5–5.1)
Sodium: 144 mmol/L (ref 135–145)
TCO2: 25 mmol/L (ref 0–100)

## 2016-08-23 LAB — COMPREHENSIVE METABOLIC PANEL
ALK PHOS: 66 U/L (ref 38–126)
ALT: 16 U/L (ref 14–54)
ANION GAP: 5 (ref 5–15)
AST: 26 U/L (ref 15–41)
Albumin: 3.7 g/dL (ref 3.5–5.0)
BILIRUBIN TOTAL: 0.7 mg/dL (ref 0.3–1.2)
BUN: 21 mg/dL — ABNORMAL HIGH (ref 6–20)
CALCIUM: 9.2 mg/dL (ref 8.9–10.3)
CO2: 29 mmol/L (ref 22–32)
Chloride: 109 mmol/L (ref 101–111)
Creatinine, Ser: 1.22 mg/dL — ABNORMAL HIGH (ref 0.44–1.00)
GFR, EST AFRICAN AMERICAN: 54 mL/min — AB (ref >60)
GFR, EST NON AFRICAN AMERICAN: 47 mL/min — AB (ref >60)
GLUCOSE: 81 mg/dL (ref 65–99)
Potassium: 4.2 mmol/L (ref 3.5–5.1)
Sodium: 143 mmol/L (ref 135–145)
TOTAL PROTEIN: 7.5 g/dL (ref 6.5–8.1)

## 2016-08-23 LAB — CBG MONITORING, ED
GLUCOSE-CAPILLARY: 186 mg/dL — AB (ref 65–99)
Glucose-Capillary: 62 mg/dL — ABNORMAL LOW (ref 65–99)
Glucose-Capillary: 81 mg/dL (ref 65–99)
Glucose-Capillary: 86 mg/dL (ref 65–99)

## 2016-08-23 MED ORDER — DEXTROSE 50 % IV SOLN
1.0000 | Freq: Once | INTRAVENOUS | Status: AC
  Administered 2016-08-23: 50 mL via INTRAVENOUS
  Filled 2016-08-23: qty 50

## 2016-08-23 MED ORDER — SODIUM CHLORIDE 0.9 % IV BOLUS (SEPSIS)
1000.0000 mL | Freq: Once | INTRAVENOUS | Status: AC
  Administered 2016-08-23: 1000 mL via INTRAVENOUS

## 2016-08-23 NOTE — ED Notes (Signed)
Pt unable to sign for discharge due to history of dementia.

## 2016-08-23 NOTE — ED Triage Notes (Signed)
[facility] d/t pt seeming more drowsy than usual and not opening her eyes as much. Also facility stated pt was having "blood pressure issues". Pt hx hypertension. Pt BP upon arrival 147/7. Pt has eyes open and is interactive w/ this Clinical research associatewriter. Pt also has hx of nonverbal dementia. NAD noted

## 2016-08-23 NOTE — ED Notes (Signed)
Bed: [id number] Expected date:  Expected time:  Means of arrival:  Comments: 61 yo F  Lethargic, seen earlier today

## 2016-08-23 NOTE — ED Notes (Signed)
Bed: [id number] Expected date:  Expected time:  Means of arrival:  Comments: EMS altered mental status from SNF

## 2016-08-23 NOTE — ED Triage Notes (Signed)
Per EMS patien brought in [facility] due to AMS. Staff informed ems that patient is baseline nonverbal with dementia, however she generally makes eye contact, some facial expressions, and follows min. Commands. Staff noticed a difference in behavior and that patient was more drowsy during second shift on yesterday. Night shift staff stated patient seemed more lethargic for them as well and would not hardly open eyes or make contact. Per ems bp 160/117 (known hx of htn and uncontrolled), 54, SB on monitor, 100%RA. Patient will not follow commands but when arms are pulled some resistance with equal strength.

## 2016-08-23 NOTE — Discharge Instructions (Signed)
Follow up with your primary care provider. Go to your neurology appointment as scheduled. Return to ED if symptoms worsen in the meantime, fever, chills, c/p, abdominal pain, or any new concerning symptoms.

## 2016-08-23 NOTE — ED Provider Notes (Signed)
[facility]-EMERGENCY DEPT Provider Note   CSN: [id number] Arrival date & time: 08/23/16  0601     History   Chief Complaint Chief Complaint  Patient presents with  . Altered Mental Status    HPI Alexandria Lowe is a 61 y.o. female with PMH of dementia presenting via EMS with decreased mental status from baseline. Her report her baseline is unresponsive to verbal, eye tracking and some response to command. Since then her last and she has been more lethargic and sleeping which is unusual for her. She was not opening her eyes to verbal and less responsive to command. He denied any trauma or fall, fever or other new symptoms.  HPI  Past Medical History:  Diagnosis Date  . Alzheimer's disease 06/17/2014  . Convulsions/seizures (HCC) 06/17/2014  . Depression   . Hyperlipidemia   . Hypertension   . Nonverbal    "for awhile now"/sister (06/01/2015)    Patient Active Problem List   Diagnosis Date Noted  . Altered mental state   . Fracture of right humerus 11/05/2015  . Anemia 11/05/2015  . Essential hypertension 11/05/2015  . Hyperlipidemia 11/05/2015  . "walking corpse" syndrome   . Seizures (HCC) 11/04/2015  . Contusion   . Altered mental status 06/01/2015  . Seizure (HCC)   . Alzheimer's disease 06/17/2014  . Convulsions/seizures (HCC) 06/17/2014  . Hypertensive urgency 03/10/2013  . Hypokalemia 03/10/2013  . Pre-syncope 03/10/2013  . Dementia     Past Surgical History:  Procedure Laterality Date  . DILATION AND CURETTAGE OF UTERUS    . VAGINAL HYSTERECTOMY      OB History    No data available       Home Medications    Prior to Admission medications   Medication Sig Start Date End Date Taking? Authorizing Provider  acetaminophen (TYLENOL) 500 MG tablet Take 500 mg by mouth every 4 (four) hours as needed for mild pain, moderate pain, fever or headache.    Yes Historical Provider, MD  alum & mag hydroxide-simeth (MINTOX) 200-200-20 MG/5ML suspension Take 30 mLs by  mouth as needed for indigestion or heartburn.    Yes Historical Provider, MD  baclofen (LIORESAL) 10 MG tablet Take 10 mg by mouth 2 (two) times daily.   Yes Historical Provider, MD  brimonidine (ALPHAGAN P) 0.1 % SOLN Place 1 drop into both eyes 2 (two) times daily.   Yes Historical Provider, MD  brinzolamide (AZOPT) 1 % ophthalmic suspension Place 1 drop into both eyes 2 (two) times daily.   Yes Historical Provider, MD  Calcium Carbonate-Vitamin D (CALCIUM-D) 600-400 MG-UNIT TABS Take 1 tablet by mouth daily with breakfast.    Yes Historical Provider, MD  divalproex (DEPAKOTE) 125 MG DR tablet 2 capsules twice a day for 2 weeks, then take 4 capsules twice a day Patient taking differently: Take 500 mg by mouth 2 (two) times daily.  07/03/16  Yes York Spaniel, MD  guaiFENesin (ROBITUSSIN) 100 MG/5ML liquid Take 200 mg by mouth every 6 (six) hours as needed for cough.   Yes Historical Provider, MD  levETIRAcetam (KEPPRA) 1000 MG tablet Take 1 tablet (1,000 mg total) by mouth 2 (two) times daily. 07/17/16  Yes York Spaniel, MD  lisinopril-hydrochlorothiazide (PRINZIDE,ZESTORETIC) 20-12.5 MG tablet Take 1 tablet by mouth daily with breakfast.    Yes Historical Provider, MD  loperamide (IMODIUM) 2 MG capsule Take 2 mg by mouth as needed for diarrhea or loose stools.   Yes Historical Provider, MD  loratadine (CLARITIN)  10 MG tablet Take 10 mg by mouth daily with breakfast.    Yes Historical Provider, MD  LORazepam (ATIVAN) 0.5 MG tablet Take 0.5 mg by mouth every 8 (eight) hours as needed for anxiety.   Yes Historical Provider, MD  magnesium hydroxide (MILK OF MAGNESIA) 400 MG/5ML suspension Take 30 mLs by mouth at bedtime as needed for mild constipation.   Yes Historical Provider, MD  meloxicam (MOBIC) 15 MG tablet Take 15 mg by mouth daily.   Yes Historical Provider, MD  mirtazapine (REMERON) 30 MG tablet Take 30 mg by mouth at bedtime.   Yes Historical Provider, MD  mometasone (NASONEX) 50 MCG/ACT  nasal spray Place 2 sprays into the nose daily after breakfast.    Yes Historical Provider, MD  neomycin-bacitracin-polymyxin (NEOSPORIN) [phone] ointment Apply 1 application topically as needed (for wound care).    Yes Historical Provider, MD  Nutritional Supplements (NUTRITIONAL SHAKE PO) Take 1 Can by mouth 3 (three) times daily. Mighty Shakes   Yes Historical Provider, MD  sertraline (ZOLOFT) 100 MG tablet Take 100 mg by mouth daily.   Yes Historical Provider, MD  simvastatin (ZOCOR) 10 MG tablet Take 10 mg by mouth at bedtime.   Yes Historical Provider, MD  Skin Protectants, Misc. (MINERIN) CREA Apply 1 application topically at bedtime. Applies to whole body   Yes Historical Provider, MD  Travoprost, BAK Free, (TRAVATAN Z) 0.004 % SOLN ophthalmic solution Place 1 drop into both eyes at bedtime.   Yes Historical Provider, MD  cephALEXin (KEFLEX) 250 MG capsule Take 1 capsule (250 mg total) by mouth 4 (four) times daily. Patient not taking: Reported on 08/10/2016 07/27/16   Mancel BaleElliott Wentz, MD  sertraline (ZOLOFT) 50 MG tablet Take 1 tablet (50 mg total) by mouth daily. Patient taking differently: Take 100 mg by mouth daily with breakfast.  03/06/13   Levert FeinsteinYijun Yan, MD    Family History Family History  Problem Relation Age of Onset  . Cancer Mother     lung  . Hypertension Sister   . Thyroid disease Sister   . Hypertension Brother   . Diabetes Brother     Social History Social History  Substance Use Topics  . Smoking status: Never Smoker  . Smokeless tobacco: Never Used  . Alcohol use No     Allergies   Patient has no known allergies.   Review of Systems Review of Systems  Unable to perform ROS: Patient nonverbal  Constitutional: Negative for chills and fever.  HENT: Negative for ear pain and sore throat.   Eyes: Negative for pain and visual disturbance.  Respiratory: Negative for cough, choking, chest tightness, shortness of breath, wheezing and stridor.   Cardiovascular:  Negative for chest pain and palpitations.  Gastrointestinal: Negative for abdominal pain and vomiting.  Genitourinary: Negative for difficulty urinating, dysuria, flank pain and hematuria.  Musculoskeletal: Negative for arthralgias and back pain.  Skin: Negative for color change and rash.  Neurological: Negative for seizures and syncope.   ROS based on report. Patient is non-verbal and unable to provide information  Physical Exam Updated Vital Signs BP 135/65 (BP Location: Left Arm)   Pulse (!) 51   Temp 99.8 F (37.7 C) (Rectal)   Resp 17   Ht 5\' 2"  (1.575 m)   Wt 63.5 kg   SpO2 100%   BMI 25.61 kg/m   Physical Exam  Constitutional: She appears well-developed and well-nourished. No distress.  Afebrile with rectal temperature, nontoxic-appearing, inconsistent eye-opening to verbal.  HENT:  Head: Normocephalic.  Hematoma on the right frontal area  Eyes:  Patient closed her eyes firmly when attempting to pull up eyelid to assess pupils  Neck: Neck supple.  Cardiovascular: Normal rate, regular rhythm, normal heart sounds and intact distal pulses.   No murmur heard. Pulmonary/Chest: Effort normal and breath sounds normal. No respiratory distress. She has no wheezes. She has no rales. She exhibits no tenderness.  Abdominal: Soft. Bowel sounds are normal. She exhibits no distension and no mass. There is no tenderness. There is no rebound and no guarding.  Musculoskeletal: She exhibits no edema, tenderness or deformity.  She was crossing and uncrossing her legs. Moving all extremities.   Neurological: GCS eye subscore is 3. GCS verbal subscore is 1. GCS motor subscore is 6.  Responsive to verbal at times eye opening Responded to commands for grip strength. Equal grips bilaterally.  Skin: Skin is warm and dry. No rash noted. She is not diaphoretic. No erythema. No pallor.  Psychiatric: She has a normal mood and affect.  Nursing note and vitals reviewed.    ED Treatments /  Results  Labs (all labs ordered are listed, but only abnormal results are displayed) Labs Reviewed  CBC WITH DIFFERENTIAL/PLATELET - Abnormal; Notable for the following:       Result Value   Hemoglobin 11.3 (*)    HCT 35.1 (*)    MCH 25.3 (*)    RDW 16.8 (*)    All other components within normal limits  COMPREHENSIVE METABOLIC PANEL - Abnormal; Notable for the following:    BUN 21 (*)    Creatinine, Ser 1.22 (*)    GFR calc non Af Amer 47 (*)    GFR calc Af Amer 54 (*)    All other components within normal limits  URINALYSIS, ROUTINE W REFLEX MICROSCOPIC - Abnormal; Notable for the following:    Ketones, ur 5 (*)    All other components within normal limits  CBG MONITORING, ED - Abnormal; Notable for the following:    Glucose-Capillary 62 (*)    All other components within normal limits  CBG MONITORING, ED - Abnormal; Notable for the following:    Glucose-Capillary 186 (*)    All other components within normal limits  I-STAT TROPOININ, ED  CBG MONITORING, ED    BUN  Date Value Ref Range Status  08/23/2016 21 (H) 6 - 20 mg/dL Final  45/40/9811 24 (H) 6 - 20 mg/dL Final  91/47/8295 19 6 - 20 mg/dL Final  62/13/0865 22 (H) 6 - 20 mg/dL Final   Creatinine, Ser  Date Value Ref Range Status  08/23/2016 1.22 (H) 0.44 - 1.00 mg/dL Final  78/46/9629 5.28 (H) 0.44 - 1.00 mg/dL Final  41/32/4401 0.27 (H) 0.44 - 1.00 mg/dL Final  25/36/6440 3.47 0.44 - 1.00 mg/dL Final      EKG  EKG Interpretation  Date/Time:  Thursday August 23 2016 07:45:35 EDT Ventricular Rate:  53 PR Interval:    QRS Duration: 96 QT Interval:  455 QTC Calculation: 428 R Axis:   73 Text Interpretation:  Sinus rhythm Abnormal R-wave progression, early transition Baseline wander in lead(s) II No significant change was found Confirmed by CAMPOS  MD, KEVIN ([id number]) on 08/23/2016 8:49:56 AM       Radiology Dg Chest 2 View  Result Date: 08/23/2016 CLINICAL DATA:  Altered mental status. EXAM: CHEST  2  VIEW COMPARISON:  08/10/2016 .  11/04/2015. FINDINGS: Mediastinum and hilar structures are normal. The lungs are clear.  Heart size normal. No pleural effusion or pneumothorax. Degenerative changes thoracic spine. Stable deformity proximal right humerus. IMPRESSION: No acute cardiopulmonary disease. Electronically Signed   By: Maisie Fus  Register   On: 08/23/2016 07:39   Ct Head Wo Contrast  Result Date: 08/23/2016 CLINICAL DATA:  Lethargy and altered mental status.  Dementia EXAM: CT HEAD WITHOUT CONTRAST TECHNIQUE: Contiguous axial images were obtained from the base of the skull through the vertex without intravenous contrast. COMPARISON:  08/10/2016 FINDINGS: Brain: No evidence of acute infarction, hemorrhage, hydrocephalus, extra-axial collection or mass lesion/mass effect. Brain atrophy with frontotemporal predominance. Temporal volume loss correlates with history of Alzheimer's disease. Mild microvascular ischemic change. Vascular: No hyperdense vessel or unexpected calcification. Skull: Decreasing forehead hematoma.  No acute osseous finding. Sinuses/Orbits: Negative IMPRESSION: 1. No acute finding. 2. Atrophy in keeping with history of Alzheimer's disease. Electronically Signed   By: Marnee Spring M.D.   On: 08/23/2016 08:45    Procedures Procedures (including critical care time)  Medications Ordered in ED Medications  sodium chloride 0.9 % bolus 1,000 mL (1,000 mLs Intravenous New Bag/Given 08/23/16 0850)  dextrose 50 % solution 50 mL (50 mLs Intravenous Given 08/23/16 0810)     Initial Impression / Assessment and Plan / ED Course  I have reviewed the triage vital signs and the nursing notes.  Pertinent labs & imaging results that were available during my care of the patient were reviewed by me and considered in my medical decision making (see chart for details).    Patient presents from nursing facility with complaint of decreased mental status. GCS 10  Patient is nonverbal. At  baseline, she is usually able to follow some commands and eye tracking. She has been sleepier than usual since dinner last night and not eye-opening to verbal. They deny any falls or trauma. On my exam, she appears to have a small hematoma to the right forehead. She sometimes attempts to open her eyes to verbal, but having difficulties. Some following of commands. She did have some motor response with hand grips.  she was moving all extremities. Would not allow me to open her eyes to assess pupils.  She had noperitoneal signs, guarding abdominal pain to palpation. Strong distal pulses. No lower extremity edema.  Initial troponin negative CXR with acute cardiopulmonary process Afebrile by rectal temp In-and-out cath UA: unremarkable CT head negative for acute changes Labs unremarkable. On reassessment, she was opening her eyes and looking at me, moving all extremities and eye tracking. She appears to be at described baseline while in ED. Given negative workup, stable vitals, afebrile, non-toxic, she appears safe for discharge back to skilled nursing facility at this time.  Patient was discussed with Dr. Patria Mane who has seen patient and agrees with assessment and plan.  Discharge home with PCP follow up and neurology as previously scheduled.  Final Clinical Impressions(s) / ED Diagnoses   Final diagnoses:  Somnolence    New Prescriptions New Prescriptions   No medications on file     Gregary Cromer 08/23/16 1005    Azalia Bilis, MD 08/23/16 1141

## 2016-08-23 NOTE — ED Provider Notes (Signed)
[facility]-EMERGENCY DEPT Provider Note   CSN: [id number] Arrival date & time: 08/23/16  1939     History   Chief Complaint Chief Complaint  Patient presents with  . Fatigue    HPI Alexandria Lowe is a 61 y.o. female.  HPI Pt with history of dementia, HTN, HL comes in with cc of somnolence. Pt was seen earlier today - CT head, urine analysis and basic labs workup was negative.  [facility] They report that patient is not as active, she is more sleepy and also when she was walking, she kept on running into wall. Pt's sister came to check on patient, noted the activity and requested pt come back to the ER.  LEVEL 5 CAVEAT FOR DEMENTIA  Past Medical History:  Diagnosis Date  . Alzheimer's disease 06/17/2014  . Convulsions/seizures (HCC) 06/17/2014  . Depression   . Hyperlipidemia   . Hypertension   . Nonverbal    "for awhile now"/sister (06/01/2015)    Patient Active Problem List   Diagnosis Date Noted  . Altered mental state   . Fracture of right humerus 11/05/2015  . Anemia 11/05/2015  . Essential hypertension 11/05/2015  . Hyperlipidemia 11/05/2015  . "walking corpse" syndrome   . Seizures (HCC) 11/04/2015  . Contusion   . Altered mental status 06/01/2015  . Seizure (HCC)   . Alzheimer's disease 06/17/2014  . Convulsions/seizures (HCC) 06/17/2014  . Hypertensive urgency 03/10/2013  . Hypokalemia 03/10/2013  . Pre-syncope 03/10/2013  . Dementia     Past Surgical History:  Procedure Laterality Date  . DILATION AND CURETTAGE OF UTERUS    . VAGINAL HYSTERECTOMY      OB History    No data available       Home Medications    Prior to Admission medications   Medication Sig Start Date End Date Taking? Authorizing Provider  acetaminophen (TYLENOL) 500 MG tablet Take 500 mg by mouth every 4 (four) hours as needed for mild pain, moderate pain, fever or headache.    Yes Historical Provider, MD  baclofen (LIORESAL) 10 MG tablet Take 10 mg  by mouth 2 (two) times daily.   Yes Historical Provider, MD  brimonidine (ALPHAGAN P) 0.1 % SOLN Place 1 drop into both eyes 2 (two) times daily.   Yes Historical Provider, MD  brinzolamide (AZOPT) 1 % ophthalmic suspension Place 1 drop into both eyes 2 (two) times daily.   Yes Historical Provider, MD  Calcium Carbonate-Vitamin D (CALCIUM-D) 600-400 MG-UNIT TABS Take 1 tablet by mouth daily with breakfast.    Yes Historical Provider, MD  divalproex (DEPAKOTE) 125 MG DR tablet 2 capsules twice a day for 2 weeks, then take 4 capsules twice a day Patient taking differently: Take 500 mg by mouth 2 (two) times daily.  07/03/16  Yes York Spanielharles K Willis, MD  levETIRAcetam (KEPPRA) 1000 MG tablet Take 1 tablet (1,000 mg total) by mouth 2 (two) times daily. 07/17/16  Yes York Spanielharles K Willis, MD  lisinopril-hydrochlorothiazide (PRINZIDE,ZESTORETIC) 20-12.5 MG tablet Take 1 tablet by mouth daily with breakfast.    Yes Historical Provider, MD  loratadine (CLARITIN) 10 MG tablet Take 10 mg by mouth daily with breakfast.    Yes Historical Provider, MD  LORazepam (ATIVAN) 0.5 MG tablet Take 0.5 mg by mouth every 8 (eight) hours as needed for anxiety.   Yes Historical Provider, MD  meloxicam (MOBIC) 15 MG tablet Take 15 mg by mouth daily.   Yes Historical Provider, MD  mirtazapine (REMERON) 30 MG tablet Take 30 mg by mouth at bedtime.   Yes Historical Provider, MD  mometasone (NASONEX) 50 MCG/ACT nasal spray Place 2 sprays into the nose daily after breakfast.    Yes Historical Provider, MD  Nutritional Supplements (NUTRITIONAL SHAKE PO) Take 1 Can by mouth 3 (three) times daily. Mighty Shakes   Yes Historical Provider, MD  sertraline (ZOLOFT) 100 MG tablet Take 100 mg by mouth daily.   Yes Historical Provider, MD  simvastatin (ZOCOR) 10 MG tablet Take 10 mg by mouth at bedtime.   Yes Historical Provider, MD  Skin Protectants, Misc. (MINERIN) CREA Apply 1 application topically at bedtime. Applies to whole body   Yes  Historical Provider, MD  Travoprost, BAK Free, (TRAVATAN Z) 0.004 % SOLN ophthalmic solution Place 1 drop into both eyes at bedtime.   Yes Historical Provider, MD  alum & mag hydroxide-simeth (MINTOX) 200-200-20 MG/5ML suspension Take 30 mLs by mouth as needed for indigestion or heartburn.     Historical Provider, MD  cephALEXin (KEFLEX) 250 MG capsule Take 1 capsule (250 mg total) by mouth 4 (four) times daily. Patient not taking: Reported on 08/10/2016 07/27/16   Mancel Bale, MD  guaiFENesin (ROBITUSSIN) 100 MG/5ML liquid Take 200 mg by mouth every 6 (six) hours as needed for cough.    Historical Provider, MD  loperamide (IMODIUM) 2 MG capsule Take 2 mg by mouth as needed for diarrhea or loose stools.    Historical Provider, MD  magnesium hydroxide (MILK OF MAGNESIA) 400 MG/5ML suspension Take 30 mLs by mouth at bedtime as needed for mild constipation.    Historical Provider, MD  neomycin-bacitracin-polymyxin (NEOSPORIN) [phone] ointment Apply 1 application topically as needed (for wound care).     Historical Provider, MD  sertraline (ZOLOFT) 50 MG tablet Take 1 tablet (50 mg total) by mouth daily. Patient not taking: Reported on 08/23/2016 03/06/13   Levert Feinstein, MD    Family History Family History  Problem Relation Age of Onset  . Cancer Mother     lung  . Hypertension Sister   . Thyroid disease Sister   . Hypertension Brother   . Diabetes Brother     Social History Social History  Substance Use Topics  . Smoking status: Never Smoker  . Smokeless tobacco: Never Used  . Alcohol use No     Allergies   Patient has no known allergies.   Review of Systems Review of Systems  Unable to perform ROS: Dementia    ROS 10 Systems reviewed and are negative for acute change except as noted in the HPI.     Physical Exam Updated Vital Signs BP (!) 147/71 (BP Location: Right Arm)   Pulse 77   Resp 18   Ht 5\' 2"  (1.575 m)   Wt 140 lb (63.5 kg)   SpO2 100%   BMI 25.61 kg/m    Physical Exam  Constitutional: She appears well-developed.  HENT:  Head: Normocephalic and atraumatic.  Eyes: EOM are normal.  Neck: Normal range of motion. Neck supple.  Cardiovascular: Normal rate.   Pulmonary/Chest: Effort normal.  Abdominal: Bowel sounds are normal.  Skin: Skin is warm and dry.  Nursing note and vitals reviewed.    ED Treatments / Results  Labs (all labs ordered are listed, but only abnormal results are displayed) Labs Reviewed  CBC WITH DIFFERENTIAL/PLATELET  CBG MONITORING, ED  I-STAT CHEM 8, ED  I-STAT TROPOININ, ED    EKG  EKG Interpretation None  Radiology Dg Chest 2 View  Result Date: 08/23/2016 CLINICAL DATA:  Altered mental status. EXAM: CHEST  2 VIEW COMPARISON:  08/10/2016 .  11/04/2015. FINDINGS: Mediastinum and hilar structures are normal. The lungs are clear. Heart size normal. No pleural effusion or pneumothorax. Degenerative changes thoracic spine. Stable deformity proximal right humerus. IMPRESSION: No acute cardiopulmonary disease. Electronically Signed   By: Maisie Fus  Register   On: 08/23/2016 07:39   Ct Head Wo Contrast  Result Date: 08/23/2016 CLINICAL DATA:  Lethargy and altered mental status.  Dementia EXAM: CT HEAD WITHOUT CONTRAST TECHNIQUE: Contiguous axial images were obtained from the base of the skull through the vertex without intravenous contrast. COMPARISON:  08/10/2016 FINDINGS: Brain: No evidence of acute infarction, hemorrhage, hydrocephalus, extra-axial collection or mass lesion/mass effect. Brain atrophy with frontotemporal predominance. Temporal volume loss correlates with history of Alzheimer's disease. Mild microvascular ischemic change. Vascular: No hyperdense vessel or unexpected calcification. Skull: Decreasing forehead hematoma.  No acute osseous finding. Sinuses/Orbits: Negative IMPRESSION: 1. No acute finding. 2. Atrophy in keeping with history of Alzheimer's disease. Electronically Signed   By: Marnee Spring M.D.   On: 08/23/2016 08:45    Procedures Procedures (including critical care time)  Medications Ordered in ED Medications - No data to display   Initial Impression / Assessment and Plan / ED Course  I have reviewed the triage vital signs and the nursing notes.  Pertinent labs & imaging results that were available during my care of the patient were reviewed by me and considered in my medical decision making (see chart for details).    Pt comes in with cc of AMS. Looks like pt has depressed CNS and dizziness. We will get MRI brain.   Final Clinical Impressions(s) / ED Diagnoses   Final diagnoses:  Somnolence  Generalized weakness    New Prescriptions New Prescriptions   No medications on file     Derwood Kaplan, MD 08/23/16 2259

## 2016-08-23 NOTE — ED Notes (Addendum)
Patient is alert. Perineal care provided as patient was saturated with urine. Opens eyes when spoken to and mumbles. Moving around in bed. When cleaning up gripped hands and lifted legs. Strength strong in bilateral upper and lower extremeties. Small golf ball sized hematoma to right side of head.

## 2016-08-24 ENCOUNTER — Encounter (HOSPITAL_COMMUNITY): Payer: Self-pay | Admitting: Emergency Medicine

## 2016-08-24 ENCOUNTER — Emergency Department (HOSPITAL_COMMUNITY): Payer: Medicare HMO

## 2016-08-24 ENCOUNTER — Emergency Department (HOSPITAL_COMMUNITY)
Admission: EM | Admit: 2016-08-24 | Discharge: 2016-08-27 | Disposition: A | Discharge status: Home / Self Care | Payer: Medicare HMO | Attending: Emergency Medicine | Admitting: Emergency Medicine

## 2016-08-24 DIAGNOSIS — G40909 Epilepsy, unspecified, not intractable, without status epilepticus: Secondary | ICD-10-CM

## 2016-08-24 DIAGNOSIS — G300 Alzheimer's disease with early onset: Secondary | ICD-10-CM

## 2016-08-24 DIAGNOSIS — I10 Essential (primary) hypertension: Secondary | ICD-10-CM

## 2016-08-24 DIAGNOSIS — F0281 Dementia in other diseases classified elsewhere with behavioral disturbance: Secondary | ICD-10-CM

## 2016-08-24 DIAGNOSIS — R410 Disorientation, unspecified: Secondary | ICD-10-CM | POA: Diagnosis not present

## 2016-08-24 LAB — CBC WITH DIFFERENTIAL/PLATELET
BASOS ABS: 0 10*3/uL (ref 0.0–0.1)
Basophils Relative: 0 %
EOS ABS: 0.1 10*3/uL (ref 0.0–0.7)
EOS PCT: 2 %
HEMATOCRIT: 34.6 % — AB (ref 36.0–46.0)
Hemoglobin: 11.6 g/dL — ABNORMAL LOW (ref 12.0–15.0)
Lymphocytes Relative: 34 %
Lymphs Abs: 2.4 10*3/uL (ref 0.7–4.0)
MCH: 26.1 pg (ref 26.0–34.0)
MCHC: 33.5 g/dL (ref 30.0–36.0)
MCV: 77.9 fL — AB (ref 78.0–100.0)
MONO ABS: 0.5 10*3/uL (ref 0.1–1.0)
Monocytes Relative: 7 %
Neutro Abs: 3.9 10*3/uL (ref 1.7–7.7)
Neutrophils Relative %: 57 %
Platelets: 148 10*3/uL — ABNORMAL LOW (ref 150–400)
RBC: 4.44 MIL/uL (ref 3.87–5.11)
RDW: 16.5 % — ABNORMAL HIGH (ref 11.5–15.5)
WBC: 6.9 10*3/uL (ref 4.0–10.5)

## 2016-08-24 LAB — COMPREHENSIVE METABOLIC PANEL
ALBUMIN: 3.6 g/dL (ref 3.5–5.0)
ALT: 16 U/L (ref 14–54)
AST: 30 U/L (ref 15–41)
Alkaline Phosphatase: 63 U/L (ref 38–126)
Anion gap: 6 (ref 5–15)
BUN: 16 mg/dL (ref 6–20)
CO2: 26 mmol/L (ref 22–32)
CREATININE: 0.97 mg/dL (ref 0.44–1.00)
Calcium: 9.2 mg/dL (ref 8.9–10.3)
Chloride: 111 mmol/L (ref 101–111)
GFR calc non Af Amer: >60 mL/min (ref >60)
Glucose, Bld: 76 mg/dL (ref 65–99)
Potassium: 4.1 mmol/L (ref 3.5–5.1)
SODIUM: 143 mmol/L (ref 135–145)
TOTAL PROTEIN: 6.9 g/dL (ref 6.5–8.1)
Total Bilirubin: 0.6 mg/dL (ref 0.3–1.2)

## 2016-08-24 LAB — VALPROIC ACID LEVEL: Valproic Acid Lvl: 43 ug/mL — ABNORMAL LOW (ref 50.0–100.0)

## 2016-08-24 MED ORDER — LORAZEPAM 2 MG/ML IJ SOLN
1.0000 mg | Freq: Once | INTRAMUSCULAR | Status: AC
  Administered 2016-08-24: 1 mg via INTRAMUSCULAR
  Filled 2016-08-24: qty 1

## 2016-08-24 NOTE — ED Provider Notes (Signed)
Patient has had no acute events overnight. Sleeping throughout the night. Bradycardic, unchanged from before. MRI requests abd 1 view to r/o devices that would prevent MRI as family cannot be contacted this AM (haven't answered). MRI pending. Care transferred to Dr. Vernie Ammons, MD 08/24/16 [phone]

## 2016-08-24 NOTE — ED Notes (Signed)
Called main lab to draw labs after 4 attempts by ED staff

## 2016-08-24 NOTE — ED Notes (Signed)
[facility] AT BEDSIDE FOR PT TRANSPORT. Pt changed with new brief and warm blankets.

## 2016-08-24 NOTE — ED Notes (Signed)
MRI called.  Unable to get in contact w/patients family and cannot get completely accurate hx (dementia) so does not feel comfortable doing MRI until he can get more information.  MD Criss Alvine aware.  Both MRI tech and RN will attempt to contact family again.
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
## 2016-08-24 NOTE — Discharge Instructions (Signed)
Please have the patient's primary care physician reviewed the patient's medications for possible interactions and make adjustments as needed

## 2016-08-24 NOTE — ED Notes (Signed)
Blood draw attempt without success

## 2016-08-24 NOTE — ED Provider Notes (Signed)
Patient signed out to me by Dr. Cherylynn Ridges who recommended that I follow up on the patient's MRI. Patient unable to have procedure even after receiving Ativan. I did check the patient's Depakote level which is subtherapeutic. I reviewed the patient's medical record and patient seems to be at her baseline. Patient's current symptoms could've been due to medication reaction from her baseline medications. She takes Zoloft as well as many other medications. I will recommend that she see her physician who can review her medications and make adjustment as needed.   Lorre Nick, MD 08/24/16 1046

## 2016-08-24 NOTE — ED Notes (Signed)
Patient transported to MRI 

## 2016-08-24 NOTE — ED Notes (Signed)
Attempted to ambulate pt with assist of 1 staff pt can weight bear but gait is not steady and pt makes no effort to compensate.

## 2016-08-24 NOTE — ED Notes (Signed)
Pt back in room from MRI. Test unable to be completed due to patients cooperation. EDP will be notified.

## 2016-08-24 NOTE — ED Triage Notes (Signed)
Per EMS, pt had what appeared to be unwitnessed fall after being transported back to facility. Per EMS, pt has hx of dementia. Pt has no obvious deformities and is in no distress.

## 2016-08-24 NOTE — ED Provider Notes (Signed)
[facility]-EMERGENCY DEPT Provider Note   CSN: [id number] Arrival date & time: 08/24/16  1506     History   Chief Complaint Chief Complaint  Patient presents with  . Fall    HPI SHEQUILLA GOODGAME is a 61 y.o. female.  Patient with history of dementia presents to the emergency department for the third time in the past 36 hours. Patient was originally sent to the emergency department due to concern for altered mental status. Patient is nonverbal but active at baseline. She walks without any difficulty at her nursing facility, [facility]. She is commonly minimally combative, however easily redirectable by staff. Patient has been worked up in the emergency department with labs, imaging of the head, chest x-ray, UA, EKG. These were all unremarkable. Patient had a Depakote level checked which was slightly low. Patient stayed in the emergency department last night and MRI was attempted this morning, however patient could not tolerate. She was discharged back to the facility. Shortly after arriving patient was found to have an unwitnessed fall. She was found lying on her right side. Mental status otherwise unchanged. She was sent back to the emergency department.  I had a good discussion with [name], nurse at the facility who was care for the patient for 2 years. She voices concerns that "something is just not right". Patient has been walking into the walls which is very unusual. She'll walk towards the corner and yell at things that aren't there. She feels the patient is having hallucinations. She wants her to come back to the facility, however she knows that patient will not be able to be restrained, and is very concerned about her falling. She does note that 2 new medications, meloxicam and baclofen were started 4 days ago.  Level V caveat due to nonverbal and dementia.   Sister's Aram Beecham) contact info:   [phone] cell [phone] home      Past Medical History:  Diagnosis Date    . Alzheimer's disease 06/17/2014  . Convulsions/seizures (HCC) 06/17/2014  . Depression   . Hyperlipidemia   . Hypertension   . Nonverbal    "for awhile now"/sister (06/01/2015)    Patient Active Problem List   Diagnosis Date Noted  . Altered mental state   . Fracture of right humerus 11/05/2015  . Anemia 11/05/2015  . Essential hypertension 11/05/2015  . Hyperlipidemia 11/05/2015  . "walking corpse" syndrome   . Seizures (HCC) 11/04/2015  . Contusion   . Altered mental status 06/01/2015  . Seizure (HCC)   . Alzheimer's disease 06/17/2014  . Convulsions/seizures (HCC) 06/17/2014  . Hypertensive urgency 03/10/2013  . Hypokalemia 03/10/2013  . Pre-syncope 03/10/2013  . Dementia     Past Surgical History:  Procedure Laterality Date  . DILATION AND CURETTAGE OF UTERUS    . VAGINAL HYSTERECTOMY      OB History    No data available       Home Medications    Prior to Admission medications   Medication Sig Start Date End Date Taking? Authorizing Provider  acetaminophen (TYLENOL) 500 MG tablet Take 500 mg by mouth every 4 (four) hours as needed for mild pain, moderate pain, fever or headache.     Historical Provider, MD  alum & mag hydroxide-simeth (MINTOX) 200-200-20 MG/5ML suspension Take 30 mLs by mouth as needed for indigestion or heartburn.     Historical Provider, MD  baclofen (LIORESAL) 10 MG tablet Take 10 mg by mouth 2 (two) times daily.    Historical  Provider, MD  brimonidine (ALPHAGAN P) 0.1 % SOLN Place 1 drop into both eyes 2 (two) times daily.    Historical Provider, MD  brinzolamide (AZOPT) 1 % ophthalmic suspension Place 1 drop into both eyes 2 (two) times daily.    Historical Provider, MD  Calcium Carbonate-Vitamin D (CALCIUM-D) 600-400 MG-UNIT TABS Take 1 tablet by mouth daily with breakfast.     Historical Provider, MD  cephALEXin (KEFLEX) 250 MG capsule Take 1 capsule (250 mg total) by mouth 4 (four) times daily. Patient not taking: Reported on 08/10/2016  07/27/16   Mancel Bale, MD  divalproex (DEPAKOTE) 125 MG DR tablet 2 capsules twice a day for 2 weeks, then take 4 capsules twice a day Patient taking differently: Take 500 mg by mouth 2 (two) times daily.  07/03/16   York Spaniel, MD  guaiFENesin (ROBITUSSIN) 100 MG/5ML liquid Take 200 mg by mouth every 6 (six) hours as needed for cough.    Historical Provider, MD  levETIRAcetam (KEPPRA) 1000 MG tablet Take 1 tablet (1,000 mg total) by mouth 2 (two) times daily. 07/17/16   York Spaniel, MD  lisinopril-hydrochlorothiazide (PRINZIDE,ZESTORETIC) 20-12.5 MG tablet Take 1 tablet by mouth daily with breakfast.     Historical Provider, MD  loperamide (IMODIUM) 2 MG capsule Take 2 mg by mouth as needed for diarrhea or loose stools.    Historical Provider, MD  loratadine (CLARITIN) 10 MG tablet Take 10 mg by mouth daily with breakfast.     Historical Provider, MD  LORazepam (ATIVAN) 0.5 MG tablet Take 0.5 mg by mouth every 8 (eight) hours as needed for anxiety.    Historical Provider, MD  magnesium hydroxide (MILK OF MAGNESIA) 400 MG/5ML suspension Take 30 mLs by mouth at bedtime as needed for mild constipation.    Historical Provider, MD  meloxicam (MOBIC) 15 MG tablet Take 15 mg by mouth daily.    Historical Provider, MD  mirtazapine (REMERON) 30 MG tablet Take 30 mg by mouth at bedtime.    Historical Provider, MD  mometasone (NASONEX) 50 MCG/ACT nasal spray Place 2 sprays into the nose daily after breakfast.     Historical Provider, MD  neomycin-bacitracin-polymyxin (NEOSPORIN) [phone] ointment Apply 1 application topically as needed (for wound care).     Historical Provider, MD  Nutritional Supplements (NUTRITIONAL SHAKE PO) Take 1 Can by mouth 3 (three) times daily. Mighty Shakes    Historical Provider, MD  sertraline (ZOLOFT) 100 MG tablet Take 100 mg by mouth daily.    Historical Provider, MD  sertraline (ZOLOFT) 50 MG tablet Take 1 tablet (50 mg total) by mouth daily. Patient not taking:  Reported on 08/23/2016 03/06/13   Levert Feinstein, MD  simvastatin (ZOCOR) 10 MG tablet Take 10 mg by mouth at bedtime.    Historical Provider, MD  Skin Protectants, Misc. (MINERIN) CREA Apply 1 application topically at bedtime. Applies to whole body    Historical Provider, MD  Travoprost, BAK Free, (TRAVATAN Z) 0.004 % SOLN ophthalmic solution Place 1 drop into both eyes at bedtime.    Historical Provider, MD    Family History Family History  Problem Relation Age of Onset  . Cancer Mother     lung  . Hypertension Sister   . Thyroid disease Sister   . Hypertension Brother   . Diabetes Brother     Social History Social History  Substance Use Topics  . Smoking status: Never Smoker  . Smokeless tobacco: Never Used  . Alcohol use No  Allergies   Patient has no known allergies.   Review of Systems Review of Systems  Unable to perform ROS: Dementia     Physical Exam Updated Vital Signs BP (!) 185/76   Pulse (!) 51   SpO2 100%   Physical Exam  Constitutional: She appears well-developed and well-nourished.  HENT:  Head: Normocephalic. Head is without raccoon's eyes and without Battle's sign.    Right Ear: Tympanic membrane, external ear and ear canal normal. No hemotympanum.  Left Ear: Tympanic membrane, external ear and ear canal normal. No hemotympanum.  Nose: Nose normal. No nasal septal hematoma.  Mouth/Throat: Uvula is midline, oropharynx is clear and moist and mucous membranes are normal.  Eyes: Conjunctivae, EOM and lids are normal. Pupils are equal, round, and reactive to light. Right eye exhibits no discharge. Left eye exhibits no discharge. Right eye exhibits no nystagmus. Left eye exhibits no nystagmus.  No visible hyphema noted  Neck: Normal range of motion. Neck supple.  Cardiovascular: Normal rate, regular rhythm and normal heart sounds.   Pulmonary/Chest: Effort normal and breath sounds normal.  Abdominal: Soft. There is no tenderness.  Musculoskeletal:        Cervical back: She exhibits normal range of motion, no tenderness and no bony tenderness.       Thoracic back: She exhibits no tenderness and no bony tenderness.       Lumbar back: She exhibits no tenderness and no bony tenderness.  Neurological: She is alert. She has normal strength and normal reflexes. She exhibits normal muscle tone. GCS eye subscore is 4. GCS verbal subscore is 5. GCS motor subscore is 6.  Patient moves all extremities without pain. Unable to perform detailed neurological assessment due to uncooperation.  Skin: Skin is warm and dry.  Psychiatric:  Patient is demented at baseline. Nonverbal. She is combative with staff.  Nursing note and vitals reviewed.    ED Treatments / Results  Labs (all labs ordered are listed, but only abnormal results are displayed) Labs Reviewed  CBC WITH DIFFERENTIAL/PLATELET - Abnormal; Notable for the following:       Result Value   Hemoglobin 11.6 (*)    HCT 34.6 (*)    MCV 77.9 (*)    RDW 16.5 (*)    Platelets 148 (*)    All other components within normal limits  COMPREHENSIVE METABOLIC PANEL  CBC WITH DIFFERENTIAL/PLATELET    Radiology Dg Chest 2 View  Result Date: 08/23/2016 CLINICAL DATA:  Altered mental status. EXAM: CHEST  2 VIEW COMPARISON:  08/10/2016 .  11/04/2015. FINDINGS: Mediastinum and hilar structures are normal. The lungs are clear. Heart size normal. No pleural effusion or pneumothorax. Degenerative changes thoracic spine. Stable deformity proximal right humerus. IMPRESSION: No acute cardiopulmonary disease. Electronically Signed   By: Maisie Fus  Register   On: 08/23/2016 07:39   Ct Head Wo Contrast  Result Date: 08/23/2016 CLINICAL DATA:  Lethargy and altered mental status.  Dementia EXAM: CT HEAD WITHOUT CONTRAST TECHNIQUE: Contiguous axial images were obtained from the base of the skull through the vertex without intravenous contrast. COMPARISON:  08/10/2016 FINDINGS: Brain: No evidence of acute infarction,  hemorrhage, hydrocephalus, extra-axial collection or mass lesion/mass effect. Brain atrophy with frontotemporal predominance. Temporal volume loss correlates with history of Alzheimer's disease. Mild microvascular ischemic change. Vascular: No hyperdense vessel or unexpected calcification. Skull: Decreasing forehead hematoma.  No acute osseous finding. Sinuses/Orbits: Negative IMPRESSION: 1. No acute finding. 2. Atrophy in keeping with history of Alzheimer's disease. Electronically Signed  By: Marnee Spring M.D.   On: 08/23/2016 08:45   Dg Abd Portable 1 View  Result Date: 08/24/2016 CLINICAL DATA:  Pre MRI evaluation EXAM: PORTABLE ABDOMEN - 1 VIEW COMPARISON:  None. FINDINGS: Scattered large and small bowel gas is noted. No radiopaque foreign body is noted. Mild degenerative change of the lumbar spine is noted. IMPRESSION: No radiopaque foreign body is noted. Electronically Signed   By: Alcide Clever M.D.   On: 08/24/2016 07:49    Procedures Procedures (including critical care time)  Medications Ordered in ED Medications - No data to display   Initial Impression / Assessment and Plan / ED Course  I have reviewed the triage vital signs and the nursing notes.  Pertinent labs & imaging results that were available during my care of the patient were reviewed by me and considered in my medical decision making (see chart for details).     Patient seen and examined. Work-up initiated. C-collar removed after discussion with Dr. Patria Mane. Exam difficult, but patient moving well. She looks around without apparent pain or difficulty. C-spine palpated without any apparent pain. Do not feel re-imaging is indicated at this time.   Vital signs reviewed and are as follows: BP (!) 185/76   Pulse (!) 51   SpO2 100%   Labs ordered. Patient evaluated by hospitalist, Dr. Maryfrances Bunnell. Agrees with previous work-up. Do not feel any medical criteria at this time. Requests TTS eval for geripsych placement. Also PT  eval requested.   TTS eval completed. Pt will meet criteria for inpatient placement and currently pending placement. Pt will be holding.   12:34 AM Handoff to [facility] at shift change.   Final Clinical Impressions(s) / ED Diagnoses   Final diagnoses:  Disorientation   Pending placement.   New Prescriptions New Prescriptions   No medications on file     Renne Crigler, PA-C 08/25/16 0035    Renne Crigler, PA-C 08/25/16 0038    Azalia Bilis, MD 08/25/16 [phone]

## 2016-08-24 NOTE — ED Notes (Signed)
Telephone communication with Alexandria Lowe sister of pt. Pt to be transported back to [facility] via [place].

## 2016-08-24 NOTE — ED Notes (Signed)
Pt sleeping peacefully. Makes movements when name is called but continues to sleep. Vital signs stable.

## 2016-08-24 NOTE — ED Notes (Signed)
Notified phlebotomy that blood hemolyzed and needs redraw. Main lab sts they will come re-collect

## 2016-08-24 NOTE — Consult Note (Signed)
Hospitalist Service Medical Consultation   Alexandria Lowe  ZOX:[id number]  DOB: Dec 16, 1955  DOA: 08/24/2016  PCP: Ron Parker, MD   Outpatient Specialists:  Neurology: Melburn Popper   Requesting physician: Rhea Bleacher, PA-C  Reason for consultation: Behavioral disturbance, fall   History of Present Illness: Alexandria Lowe is an 61 y.o. female with advanced early onset Alzheimer's dementia, epilepsy and HTN who presents with altered behavior from NH.  All history collected from staff via EDP as patient is unable to provide independent history.  Evidently, the patient has been behaving oddly over the last 48 hours.  Firstly, she had a day where her behavior was described as "lethargic" and "less responsive to command".  She was evaluated in the ER, had normal electrolytes, blood counts, CT head and urinalysis.  Was given fluids and discharged to home with Neuro follow up.  She was discharged to home, where at her facility [facility] she reportedly was "more sleepy" and "kept on running into wall" and talking to things that weren't there and so was sent back.  She was kept overnight and MRI brain was ordered for the morning without Neuro consultation but in the morning, she was unable to tolerate the exam.  Because she appeared near her baseline mental status, she was sent back to her facility.    At her facility today, she had an unwitnessed fall and was sent back a third time.  Reportedly, the patient's care staff at [facility] feel that "something is just not right", and describe what is noted above over the last two days: hypoactivity, bumping into walls, walking towards the corner and yelling at things that aren't there.   Also, of note, baclofen and Mobic was started 4 days ago.   ED course: -Heart rate 100, respirations 14, BP 181/81, pulse ox 97% on room air -Repeat labs showed Na 143, K 4.1, Cr 0.97, WBC 6.9L, Hgb 11.6 and microcytic -TRH were asked to evaluate  for medical clearance        Review of Systems:  Unable to obtain due to dementia, patient nonverbal at baseline.  Past Medical History: Past Medical History:  Diagnosis Date  . Alzheimer's disease 06/17/2014  . Convulsions/seizures (HCC) 06/17/2014  . Depression   . Hyperlipidemia   . Hypertension   . Nonverbal    "for awhile now"/sister (06/01/2015)    Past Surgical History: Past Surgical History:  Procedure Laterality Date  . DILATION AND CURETTAGE OF UTERUS    . VAGINAL HYSTERECTOMY       Allergies:  No Known Allergies   Social History:  reports that she has never smoked. She has never used smokeless tobacco. She reports that she does not drink alcohol or use drugs.   Family History: Family History  Problem Relation Age of Onset  . Cancer Mother     lung  . Hypertension Sister   . Thyroid disease Sister   . Hypertension Brother   . Diabetes Brother      Physical Exam: Vitals:   08/24/16 1520 08/24/16 1945  BP:  (!) 181/81  Pulse:  (!) 107  SpO2: 97% (!) 89%    Constitutional: Awake, chewing gum, lying in bed with knees up, tracks with eyes, occasional humming, says "okay" once, no other vocalizations Eyes: Closes eyes to light, pupils appear equal, no gaze deviation or nystagmus, irises appear normal, anicteric sclera,  ENMT: external ears and nose  appear normal, hearing not able to test            Lips appears normal, but dry, does not open mouth Neck: neck appears normal, no masses, normal ROM, no thyromegaly, no JVD  CVS: S1-S2 clear, no murmur rubs or gallops, no LE edema, normal pedal pulses  Respiratory:  clear to auscultation bilaterally, no wheezing, rales or rhonchi. Respiratory effort normal. No accessory muscle use.  GI: soft nontender, nondistended, normal bowel sounds, no hepatosplenomegaly, no hernias  Musculoskeletal: no cyanosis, clubbing or edema noted bilaterally Neuro: Closes eyes to light, pupils appear equal, no gaze deviation  or nystagmus, cranial nerve 7 is symmetrical based on her spontaneous movements. Motor strength testing is 5/5 in the upper and lower extremities bilaterally with normal motor, tone and bulk. Deep tendon reflexes are 1/4.   Patient able to be lifted to sitting on side of bed without titubation.  Does not or cannot independently stand, and does not put feet under self, not safe to stand at bedside with nursing, MD.   Psych: unable to assess Skin: no rashes or lesions or ulcers, no induration or nodules    Data reviewed:  I have personally reviewed following labs and imaging studies Labs:  CBC:  Recent Labs Lab 08/23/16 0704 08/23/16 2246 08/23/16 2257 08/24/16 1829  WBC 7.3 7.2  --  6.9  NEUTROABS 4.3 4.3  --  3.9  HGB 11.3* 9.4* 9.5* 11.6*  HCT 35.1* 28.7* 28.0* 34.6*  MCV 78.5 78.4  --  77.9*  PLT 175 153  --  148*    Basic Metabolic Panel:  Recent Labs Lab 08/23/16 0704 08/23/16 2257 08/24/16 1751  NA 143 144 143  K 4.2 3.4* 4.1  CL 109 110 111  CO2 29  --  26  GLUCOSE 81 87 76  BUN 21* 19 16  CREATININE 1.22* 1.00 0.97  CALCIUM 9.2  --  9.2   GFR Estimated Creatinine Clearance: 53.4 mL/min (by C-G formula based on SCr of 0.97 mg/dL). Liver Function Tests:  Recent Labs Lab 08/23/16 0704 08/24/16 1751  AST 26 30  ALT 16 16  ALKPHOS 66 63  BILITOT 0.7 0.6  PROT 7.5 6.9  ALBUMIN 3.7 3.6   No results for input(s): LIPASE, AMYLASE in the last 168 hours. No results for input(s): AMMONIA in the last 168 hours. Coagulation profile No results for input(s): INR, PROTIME in the last 168 hours.  Cardiac Enzymes: No results for input(s): CKTOTAL, CKMB, CKMBINDEX, TROPONINI in the last 168 hours. BNP: Invalid input(s): POCBNP CBG:  Recent Labs Lab 08/23/16 0747 08/23/16 0821 08/23/16 0940 08/23/16 2249  GLUCAP 62* 186* 81 86   D-Dimer No results for input(s): DDIMER in the last 72 hours. Hgb A1c No results for input(s): HGBA1C in the last 72  hours. Lipid Profile No results for input(s): CHOL, HDL, LDLCALC, TRIG, CHOLHDL, LDLDIRECT in the last 72 hours. Thyroid function studies No results for input(s): TSH, T4TOTAL, T3FREE, THYROIDAB in the last 72 hours.  Invalid input(s): FREET3 Anemia work up No results for input(s): VITAMINB12, FOLATE, FERRITIN, TIBC, IRON, RETICCTPCT in the last 72 hours. Urinalysis    Component Value Date/Time   COLORURINE YELLOW 08/23/2016 0704   APPEARANCEUR CLEAR 08/23/2016 0704   LABSPEC 1.023 08/23/2016 0704   PHURINE 7.0 08/23/2016 0704   GLUCOSEU NEGATIVE 08/23/2016 0704   HGBUR NEGATIVE 08/23/2016 0704   BILIRUBINUR NEGATIVE 08/23/2016 0704   KETONESUR 5 (A) 08/23/2016 0704   PROTEINUR NEGATIVE 08/23/2016 8119  UROBILINOGEN 0.2 04/17/2014 1125   NITRITE NEGATIVE 08/23/2016 0704   LEUKOCYTESUR NEGATIVE 08/23/2016 0704     Sepsis Labs Invalid input(s): PROCALCITONIN,  WBC,  LACTICIDVEN Microbiology No results found for this or any previous visit (from the past 240 hour(s)).    Radiological Exams on Admission: The following CXR was reviewed and showed no pneumonia, the CT head and abdomen x-ray reports were reviewed: Dg Chest 2 View  Result Date: 08/23/2016 CLINICAL DATA:  Altered mental status. EXAM: CHEST  2 VIEW COMPARISON:  08/10/2016 .  11/04/2015. FINDINGS: Mediastinum and hilar structures are normal. The lungs are clear. Heart size normal. No pleural effusion or pneumothorax. Degenerative changes thoracic spine. Stable deformity proximal right humerus. IMPRESSION: No acute cardiopulmonary disease. Electronically Signed   By: Maisie Fus  Register   On: 08/23/2016 07:39   Ct Head Wo Contrast  Result Date: 08/23/2016 CLINICAL DATA:  Lethargy and altered mental status.  Dementia EXAM: CT HEAD WITHOUT CONTRAST TECHNIQUE: Contiguous axial images were obtained from the base of the skull through the vertex without intravenous contrast. COMPARISON:  08/10/2016 FINDINGS: Brain: No evidence  of acute infarction, hemorrhage, hydrocephalus, extra-axial collection or mass lesion/mass effect. Brain atrophy with frontotemporal predominance. Temporal volume loss correlates with history of Alzheimer's disease. Mild microvascular ischemic change. Vascular: No hyperdense vessel or unexpected calcification. Skull: Decreasing forehead hematoma.  No acute osseous finding. Sinuses/Orbits: Negative IMPRESSION: 1. No acute finding. 2. Atrophy in keeping with history of Alzheimer's disease. Electronically Signed   By: Marnee Spring M.D.   On: 08/23/2016 08:45   Dg Abd Portable 1 View  Result Date: 08/24/2016 CLINICAL DATA:  Pre MRI evaluation EXAM: PORTABLE ABDOMEN - 1 VIEW COMPARISON:  None. FINDINGS: Scattered large and small bowel gas is noted. No radiopaque foreign body is noted. Mild degenerative change of the lumbar spine is noted. IMPRESSION: No radiopaque foreign body is noted. Electronically Signed   By: Alcide Clever M.D.   On: 08/24/2016 07:49    Impression/Recommendations   1. Dementia with behavioral disturbance: The patient presents with new hypoactivity, and also suspected hallucinations.  The patient has normal CT head, normal urinalysis, electrolytes, CXR ECG and blood counts.  She does not appear dehydrated and has been given fluids.  From a medical standpoint, she has essentially been observed here in the ER for >24 hours without evidence of a medical cause of delirium (other than perhaps baclofen).  She is perhaps ataxic for me, and MR was attempted, but she has no hemiparesis to suggest pontine stroke or hand ataxia/nystagmus/titubation to suggest cerebellar stroke so this is doubted.   Reviewing her chart over the past two years, she is described, even in Neurology notes as far back as Jan 2016 as non-verbal and when verbal only speaking non-sense.  Suspect that if this is not from baclofen, it is likely natural progression of her dementia.   -Consult to TTS, suspect would benefit  from specialist Geriatric psych evaluation -Stop baclofen -If evaluated by psychiatry, and it is not felt that her current behavior is part of the natural trajectory for her dementia but rather baclofen related, would recommend PT evaluation and discharge back to SNF   2. Epilepsy: -Continue Depakote 500 BID -Continue Keppra 1000 mg BID  3. Hypertension: Hypertensive in ER. -Continue lisinopril-HCTZ -Continue statin  4. Other medications: -Continue mirtazapine -Continue sertraline        Alberteen Sam M.D. [facility] 08/24/2016, 8:53 PM

## 2016-08-24 NOTE — ED Notes (Signed)
Per lab CBC with diff clotted; needs recollect.

## 2016-08-24 NOTE — ED Notes (Signed)
Attempted to draw blood x2 was unsuccessful
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
## 2016-08-24 NOTE — ED Notes (Signed)
Bed: OZ30 Expected date: 08/24/16 Expected time: 3:11 PM Means of arrival: Ambulance Comments: Fall, c collar

## 2016-08-25 ENCOUNTER — Emergency Department (HOSPITAL_COMMUNITY): Payer: Medicare HMO

## 2016-08-25 ENCOUNTER — Encounter (HOSPITAL_COMMUNITY): Payer: Self-pay | Admitting: Registered Nurse

## 2016-08-25 DIAGNOSIS — R410 Disorientation, unspecified: Secondary | ICD-10-CM

## 2016-08-25 DIAGNOSIS — F0391 Unspecified dementia with behavioral disturbance: Secondary | ICD-10-CM | POA: Diagnosis not present

## 2016-08-25 DIAGNOSIS — Z79899 Other long term (current) drug therapy: Secondary | ICD-10-CM | POA: Diagnosis not present

## 2016-08-25 DIAGNOSIS — R269 Unspecified abnormalities of gait and mobility: Secondary | ICD-10-CM

## 2016-08-25 MED ORDER — HALOPERIDOL LACTATE 5 MG/ML IJ SOLN
2.5000 mg | Freq: Once | INTRAMUSCULAR | Status: AC
  Administered 2016-08-25: 2.5 mg via INTRAMUSCULAR
  Filled 2016-08-25: qty 1

## 2016-08-25 MED ORDER — LEVETIRACETAM 500 MG PO TABS
1000.0000 mg | ORAL_TABLET | Freq: Two times a day (BID) | ORAL | Status: DC
  Administered 2016-08-25 – 2016-08-27 (×5): 1000 mg via ORAL
  Filled 2016-08-25 (×5): qty 2

## 2016-08-25 MED ORDER — LORAZEPAM 1 MG PO TABS
1.0000 mg | ORAL_TABLET | Freq: Once | ORAL | Status: AC
  Administered 2016-08-25: 1 mg via ORAL
  Filled 2016-08-25: qty 1

## 2016-08-25 MED ORDER — LORAZEPAM 2 MG/ML IJ SOLN: 1.0000 mg | Freq: Once | INTRAMUSCULAR | Status: DC

## 2016-08-25 MED ORDER — DIVALPROEX SODIUM 500 MG PO DR TAB
500.0000 mg | DELAYED_RELEASE_TABLET | Freq: Two times a day (BID) | ORAL | Status: DC
  Administered 2016-08-25 – 2016-08-27 (×5): 500 mg via ORAL
  Filled 2016-08-25 (×5): qty 1

## 2016-08-25 NOTE — ED Provider Notes (Signed)
BP (!) 131/57 (BP Location: Left Arm)   Pulse (!) 59   Temp 98.1 F (36.7 C) (Rectal)   Resp 16   Ht  (1.575 m)   Wt 140 lb (63.5 kg)   SpO2 100%   BMI 25.61 kg/m    Patient presents with several days of increased confusion, hallucinations, unsteady gait. Normally able to ambulate without assistance. Head CT head performed 2 days ago without acute findings. Was evaluated by hospitalist. Thought patient did not require admission. Recommended psychiatric consultation. She was seen by TTS and after review with psychiatric PA, for appropriate for psychiatric placement. Per TTS today, patient seen by rounding psychiatrist and felt that she does not need psychiatric placement. Recommends neurology evaluation. Has been seen by physical therapy. Patient is unable to ambulate without 2+ assistance. Think she may benefit from rehabilitation placement. Discussed with Dr. Amada Jupiter. Will consult on patient.  Recommends MRI brain.    Loren Racer, MD 08/26/16 [phone]

## 2016-08-25 NOTE — ED Notes (Signed)
Patient transported to MRI 

## 2016-08-25 NOTE — Progress Notes (Signed)
CSW spoke with patient's sister Alexandria Lowe) at bedside. CSW informed patient's sister about PT recommendations for SNF for patient. Patient's sister requested that CSW reach out to patient's current ALF - [name] to inquire about their ability to continue to care for patient, CSW agreed. CSW contacted [facility], no answer no option to leave voicemail. CSW will continue to try to contact ALF.   CSW notified patient's sister that CSW was unable to contact anyone at [name] and that CSW will continue to try to make contact. CSW provided patient's sister with local SNF list as a resource if needed.   [name], LCSWA [name]  Clinical Social Worker [phone]

## 2016-08-25 NOTE — ED Provider Notes (Signed)
Patient seen by TTS.  Inpatient treatment recommended.  Awaiting placement.   Roxy Horseman, PA-C 08/25/16 1610    Zadie Rhine, MD 08/26/16 [phone]

## 2016-08-25 NOTE — ED Notes (Signed)
PT RETURNED FROM MRI. PER MRI, THE TEST WAS UNSUCCESSFUL DUE TO THE PT CONSTANTLY SITTING UP MULTIPLE TIMES.

## 2016-08-25 NOTE — Progress Notes (Signed)
CSW contacted patient's son Ever Halberg [phone]) to discuss PT recommendation for patient to go to SNF for rehab, no answer. CSW left voicemail requesting return phone call. CSW awaiting return phone call.  [name], LCSWA [name]  Clinical Social Worker [phone]

## 2016-08-25 NOTE — Evaluation (Signed)
Physical Therapy Evaluation Patient Details Name: Alexandria Lowe MRN: [id number] DOB: May 16, 1956 Today's Date: 08/25/2016   History of Present Illness  61 yo Female currently in [facility] care unit and up until redcently she has been independent with ambulation. Recent with 3 subsequent admissions with falls, "not herself" , walking into wall, yelling out. h/o dementia.   Clinical Impression  Pt pleasant during eval followed >50% of commands, respondant to verbal and tactile cues >50% of time. At times would shut her eyes and would require tactile and verbal cues even during standing, sitting or walking. Slow to respond at times. Pt found wearing brief and had urine in it, however was able to walk to bathroom with 2 person assist B hand held to guide and help with balance. Some side stepping, scissoring, and small steps requiring constant cues for forward progression. Pt able to have BM while on toilet, and responded at times with some one word responses, smiles, humming, and laughes. Gait and mobility still really unsafe at this time, will require assist and cues for safety, and could possibility see improvement with some ST-SNF and more 24 hour care /assistance. Hard to assess if this is the progression of dementia or if a medical reason for recent falls and unsteadiness.     Follow Up Recommendations SNF (feel pt would benefit from higher level of care and may likely improve a little with rehab at this time )    Equipment Recommendations       Recommendations for Other Services       Precautions / Restrictions Precautions Precautions: Fall Precaution Comments: recent falls , unwitnessed, found on floor , xrays negative, however unable to obtain MRI (pt was not cooperative )  last admission when she had a knot on her right tempal area. Pt currently has a sitter in the ED      Mobility  Bed Mobility Overal bed mobility: Needs Assistance Bed Mobility: Supine to Sit;Sit to Supine      Supine to sit: Min assist Sit to supine: Mod assist   General bed mobility comments: did intiate movment but needed assistance to complete pattern of supine to sit or sit to supine. Verbal cues, tactile cues and increased time.   Transfers Overall transfer level: Needs assistance Equipment used: 2 person hand held assist Transfers: Sit to/from Stand Sit to Stand: Min assist;Mod assist         General transfer comment: varied min to Mod to rise and sit, with verbal and tactile for both, however able to perform fairly easily , initially at times with posterior lean and static posterior lean at times as well until forward movement started.   Ambulation/Gait Ambulation/Gait assistance: Min assist;Mod assist;+2 physical assistance Ambulation Distance (Feet): 15 Feet (10 feet firt to bathroom then 15 to hallway and back ) Assistive device: 2 person hand held assist Gait Pattern/deviations: Decreased step length - right;Decreased step length - left;Shuffle;Ataxic;Leaning posteriorly     General Gait Details: variable. At times side stepping only , some scissoring, small steps, posterior lean at times, cues for consistent steps for she would stop frequently and eyes would shut. Respondant to verbal cues and tactile cues. very pleasant.   Stairs            Wheelchair Mobility    Modified Rankin (Stroke Patients Only)       Balance Overall balance assessment: Needs assistance;History of Falls Sitting-balance support: Bilateral upper extremity supported Sitting balance-Leahy Scale: Fair     Standing  balance support: Bilateral upper extremity supported Standing balance-Leahy Scale: Poor Standing balance comment: posterior lean, balance improved with forward progression, however ataxic gait, scissoring and some side stepping and stopping.                              Pertinent Vitals/Pain Pain Assessment: Faces Faces Pain Scale: No hurt    Home Living  Family/patient expects to be discharged to:: Assisted living (memory care unit)               Home Equipment: None      Prior Function Level of Independence: Needs assistance   Gait / Transfers Assistance Needed: per chart , prior to recent events , patient was ambulatory around memory unit   ADL's / Homemaking Assistance Needed: dependent        Hand Dominance        Extremity/Trunk Assessment        Lower Extremity Assessment Lower Extremity Assessment: Difficult to assess due to impaired cognition (functionally able to perform movemnt, however at times in synergistic movment patterns, and very ataxic in standing and walking )       Communication   Communication: Expressive difficulties (however was able to state a few words in repsonse to some questions, smiled 50% approriately to conversation, would hum a song )  Cognition Arousal/Alertness: Awake/alert (unsure.Marland Kitchen at times even in standing or sitting, she would close her eyes and look less alert, however responsive to verbal and tactile cues to arouse. ) Behavior During Therapy:  (did follow >50% of commands , pleasant to work with and cooperative. ) Overall Cognitive Status: Difficult to assess                                        General Comments      Exercises     Assessment/Plan    PT Assessment Patient needs continued PT services  PT Problem List Decreased strength;Decreased activity tolerance;Decreased balance;Decreased coordination;Decreased mobility       PT Treatment Interventions Gait training;Functional mobility training;Therapeutic activities;Therapeutic exercise;Balance training    PT Goals (Current goals can be found in the Care Plan section)  Acute Rehab PT Goals PT Goal Formulation: Patient unable to participate in goal setting Time For Goal Achievement: 09/02/16 Potential to Achieve Goals: Fair    Frequency Min 3X/week   Barriers to discharge         Co-evaluation               End of Session Equipment Utilized During Treatment: Gait belt Activity Tolerance: Patient tolerated treatment well (she had smiles and some laughes during our session ) Patient left: in bed;with call [place]/phone within reach;with nursing/sitter in room Nurse Communication: Mobility status PT Visit Diagnosis: Unsteadiness on feet (R26.81);Repeated falls (R29.6)    Time: 1000-1035 PT Time Calculation (min) (ACUTE ONLY): 35 min   Charges:   PT Evaluation $PT Eval Low Complexity: 1 Procedure PT Treatments $Therapeutic Activity: 8-22 mins   PT G Codes:   PT G-Codes **NOT FOR INPATIENT CLASS** Functional Assessment Tool Used: AM-PAC 6 Clicks Basic Mobility;Clinical judgement Functional Limitation: Mobility: Walking and moving around Mobility: Walking and Moving Around Current Status (Z6109): At least 60 percent but less than 80 percent impaired, limited or restricted Mobility: Walking and Moving Around Goal Status [id number]): At least 1 percent but less  than 20 percent impaired, limited or restricted    Marella Bile, PT Pager: [phone] 08/25/2016   Bijon Mineer, Clois Dupes 08/25/2016, 12:47 PM

## 2016-08-25 NOTE — Progress Notes (Signed)
Haldol was given, Pt seems sleeping, Tried to call MRI Multiple times. No one is answering. Call back again later,

## 2016-08-25 NOTE — Consult Note (Signed)
Neurology Consultation Reason for Consult: Gait difficulty Referring Physician: Ranae Palms, D  CC: Gait difficulty  History is obtained from: Chart review  HPI: Alexandria Lowe is a 61 y.o. female with end-stage Alzheimer's disease who has had a relatively acute decrease in her ability to walk. Of note, she was started on baclofen shortly before the acute change. She also has been described as lethargic and less responsive. She is been seen multiple times in the ED, with medical workups unrevealing.  She is nonverbal at baseline, does not answer any questions.  LKW: Unclear tpa given?: no, clear time of onset    ROS:  Unable to obtain due to altered mental status.   Past Medical History:  Diagnosis Date  . Alzheimer's disease 06/17/2014  . Convulsions/seizures (HCC) 06/17/2014  . Depression   . Hyperlipidemia   . Hypertension   . Nonverbal    "for awhile now"/sister (06/01/2015)     Family History  Problem Relation Age of Onset  . Cancer Mother     lung  . Hypertension Sister   . Thyroid disease Sister   . Hypertension Brother   . Diabetes Brother      Social History:  reports that she has never smoked. She has never used smokeless tobacco. She reports that she does not drink alcohol or use drugs.   Exam: Current vital signs: BP (!) 131/57 (BP Location: Left Arm)   Pulse (!) 59   Temp 98.1 F (36.7 C) (Rectal)   Resp 16   Ht  (1.575 m)   Wt 63.5 kg (140 lb)   SpO2 100%   BMI 25.61 kg/m  Vital signs in last 24 hours: Temp:  [98.1 F (36.7 C)] 98.1 F (36.7 C) (03/31 0737) Pulse Rate:  [51-107] 59 (03/31 1120) Resp:  [14-16] 16 (03/31 1120) BP: (131-185)/(57-84) 131/57 (03/31 1120) SpO2:  [89 %-100 %] 100 % (03/31 1120) Weight:  [63.5 kg (140 lb)] 63.5 kg (140 lb) (03/31 0737)   Physical Exam  Constitutional: Appears well-developed and well-nourished.  Psych: Affect appropriate to situation Eyes: No scleral injection HENT: No OP obstrucion Head:  Normocephalic.  Cardiovascular: Normal rate and regular rhythm.  Respiratory: Effort normal and breath sounds normal to anterior ascultation GI: Soft.  No distension. There is no tenderness.  Skin: WDI  Neuro: Mental Status: Patient is awake, alert, Does not speak or reliably follow commands. Cranial Nerves: II: Blinks to threat bilaterally. Pupils are equal, round, and reactive to light.   III,IV, VI: EOMI without ptosis or diploplia.  V: Facial sensation is symmetric to temperature VII: Facial movement is symmetric.  VIII: hearing is intact to voice X: Uvula elevates symmetrically XI: Shoulder shrug is symmetric. XII: tongue is midline without atrophy or fasciculations.  Motor: Paratonia versus mild increased tone.  Sensory: She does respond to stimulation in all 4 extremities Deep Tendon Reflexes: 2+ and symmetric in the biceps, ankles and patellae.  Cerebellar: No clear ataxia when moving her hands, but she does not display formal testing Gait: When attempting to stand the patient, she keeps her legs bent him a without attempting to straighten them or put her weight on top of them. She is very little assistance with standing.  I have reviewed labs in epic and the results pertinent to this consultation are: CBC-mild anemia CMP-unremarkable Valproate-48  I have reviewed the images obtained: CT head-no acute findings, but severe atrophy  Impression: 61 year old female with acutely decompensated gait. Possibilities include progression of her Alzheimer's  disease as this does appear more like a gait apraxia than anything else. Also possible would be ischemic infarct given the relative acuity. Complication of starting baclofen and sedation would also be a possibility.  Recommendations: 1) MRI brain 2) if negative, then I have no further recommendations at this time, but would discontinue baclofen. 3) continue home antiepileptics. 4) even if positive, would have to discuss with  family as far as aggressiveness desired for workup, etc. given her debilitated state.   Ritta Slot, MD [facility] [phone]  If 7pm- 7am, please page neurology on call as listed in AMION.

## 2016-08-25 NOTE — ED Notes (Signed)
SISTER AT THE BEDSIDE.

## 2016-08-25 NOTE — ED Notes (Signed)
Pt noted to remain in safety posey belt upon arrival for shift this am. Pt crossway in bed, sleeping. Awakened to obtain VS, pt did not express s/s of discomfort as movement and rectal temp was taken. Pt noted to be clean and dry with diaper on. Spoke with Aram Beecham RN to give report for move to TCU. Pt will be placed on hospital bed and safety belt removed until further evaluation as if pts continues to need belt on.

## 2016-08-25 NOTE — Consult Note (Signed)
[facility] Consult   Reason for Consult:  Odd behavior Referring Physician:  EDP Patient Identification: Alexandria Lowe MRN:  [id number] Principal Diagnosis: <principal problem not specified> Diagnosis:   Patient Active Problem List   Diagnosis Date Noted  . Altered mental state [R41.82]   . Fracture of right humerus [S42.301A] 11/05/2015  . Anemia [D64.9] 11/05/2015  . Essential hypertension [I10] 11/05/2015  . Hyperlipidemia [E78.5] 11/05/2015  . 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 Seizures ([place]) [R56.9] 11/04/2015  . Contusion [T14.8XXA]   . Altered mental status [R41.82] 06/01/2015  . Seizure ([place]) [R56.9]   . Alzheimer's disease [G30.9] 06/17/2014  . Convulsions/seizures ([place]) [R56.9] 06/17/2014  . Hypertensive urgency [I16.0] 03/10/2013  . Hypokalemia [E87.6] 03/10/2013  . Pre-syncope [R55] 03/10/2013  . Dementia [F03.90]     Total Time spent with patient: 1 hour  Subjective:   Alexandria Lowe is a 61 y.o. female patient present to the [facility] with complaints of odd behavior with history or dementia. Marland Kitchen  HPI:  Alexandria Lowe 61 y.o. female patient seen by Dr. Adele Schilder and this provider.  Chart reviewed 08/25/16.   On evaluation:  Alexandria Lowe standing with the assistance of physical therapy.  Patient unable to responding to questions.  Patient resides in nursing home memory care unit [facility]).  Staff reports that patient was recently discharged for hospital but patient was acting  "more sleepy" and "kept on running into wall" and talking to things that weren't there."   Patient has had several falls and an unwitnessed fall and change in medication in the last 4-5 days Baclofen and Mobic.   Psychotropic medications evaluation Depakote 500 mg bid (Depakote level 43) therapeutic; Remeron 30 mg; Zoloft 150 mg daily; Ativan 0.5 mg Q 8 hr prn.  No changes in psychotropics needed.  Patient cl reared psychiatrically EDP may want neurology progression of dementia and  unable to obtain MRI.   Past Psychiatric History: Unable to obtain at this time  Risk to Self: Suicidal Ideation:  (UTA) Suicidal Intent:  (UTA) Is patient at risk for suicide?:  (UTA) Suicidal Plan?:  (UTA) Access to Means:  (UTA) What has been your use of drugs/alcohol within the last 12 months?: UTA How many times?:  (UTA) Other Self Harm Risks: UTA Triggers for Past Attempts:  (UTA) Intentional Self Injurious Behavior:  (UTA) Risk to Others: Homicidal Ideation:  (UTA) Thoughts of Harm to Others:  (UTA) Current Homicidal Intent:  (UTA) Current Homicidal Plan:  (UTA) Access to Homicidal Means:  (UTA) Identified Victim: UTA History of harm to others?:  (UTA) Assessment of Violence:  (UTA) Violent Behavior Description: UTA Does patient have access to weapons?:  (UTA) Criminal Charges Pending?:  (UTA) Does patient 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  :  (UTA) Does patient have [facility] services? :  (UTA) Does patient have P4CC services?:  (UTA)  Past Medical History:  Past Medical History:  Diagnosis Date  . Alzheimer's disease 06/17/2014  . Convulsions/seizures ([place]) 06/17/2014  . Depression   . Hyperlipidemia   . Hypertension   . Nonverbal    "for awhile now"/sister (06/01/2015)    Past Surgical History:  Procedure Laterality Date  . DILATION AND CURETTAGE OF UTERUS    . VAGINAL HYSTERECTOMY     Family History:  Family History  Problem Relation Age of Onset  . Cancer Mother     lung  . Hypertension Sister   . Thyroid disease Sister   . Hypertension Brother   . Diabetes Brother    Family  Psychiatric  History: Unable to obtain Social History:  History  Alcohol Use No     History  Drug Use No    Social History   Social History  . Marital status: Widowed    Spouse name: N/A  . Number of children: 1  . Years of education: N/A   Occupational History  . Disabled    Social History Main Topics  . Smoking status: Never Smoker  . Smokeless tobacco: Never Used  . Alcohol use No  . Drug use: No  . Sexual activity: Not Asked   Other Topics Concern  . None   Social History Narrative   Patient is right handed   Patient lives at [facility].   Additional Social History:    Allergies:  No Known Allergies  Labs:  Results for orders placed or performed during the hospital encounter of 08/24/16 (from the past 48 hour(s))  Comprehensive metabolic panel     Status: None   Collection Time: 08/24/16  5:51 PM  Result Value Ref Range   Sodium 143 135 - 145 mmol/L   Potassium 4.1 3.5 - 5.1 mmol/L    Comment: DELTA CHECK NOTED REPEATED TO VERIFY NO VISIBLE HEMOLYSIS    Chloride 111 101 - 111 mmol/L   CO2 26 22 - 32 mmol/L   Glucose, Bld 76 65 - 99 mg/dL   BUN 16 6 - 20 mg/dL   Creatinine, Ser 0.97 0.44 - 1.00 mg/dL   Calcium 9.2 8.9 - 10.3 mg/dL   Total Protein 6.9 6.5 - 8.1 g/dL   Albumin 3.6 3.5 - 5.0 g/dL   AST 30 15 - 41 U/L   ALT 16 14 - 54 U/L   Alkaline Phosphatase 63 38 - 126 U/L   Total Bilirubin 0.6 0.3 - 1.2 mg/dL   GFR calc non Af Amer >60 >60 mL/min   GFR calc Af Amer >60 >60 mL/min    Comment: (NOTE) The eGFR has been calculated using the CKD EPI equation. This calculation has not been validated in all clinical situations. eGFR's persistently <60 mL/min signify possible Chronic Kidney Disease.    Anion gap 6 5 - 15  CBC with Differential/Platelet     Status: Abnormal   Collection Time: 08/24/16  6:29 PM  Result Value Ref Range   WBC 6.9 4.0 - 10.5 K/uL   RBC 4.44 3.87 - 5.11 MIL/uL   Hemoglobin 11.6 (L) 12.0 - 15.0 g/dL   HCT 34.6 (L)  36.0 - 46.0 %   MCV 77.9 (L) 78.0 - 100.0 fL   MCH 26.1 26.0 - 34.0 pg   MCHC 33.5 30.0 - 36.0 g/dL   RDW 16.5 (H) 11.5 - 15.5 %   Platelets 148 (L) 150 - 400 K/uL   Neutrophils Relative % 57 %   Neutro Abs 3.9 1.7 - 7.7 K/uL   Lymphocytes Relative 34 %   Lymphs Abs 2.4 0.7 - 4.0 K/uL   Monocytes Relative 7 %   Monocytes Absolute 0.5 0.1 - 1.0 K/uL   Eosinophils Relative 2 %   Eosinophils Absolute 0.1 0.0 - 0.7 K/uL  Basophils Relative 0 %   Basophils Absolute 0.0 0.0 - 0.1 K/uL   Smear Review LARGE PLATELETS PRESENT     No current facility-administered medications for this encounter.    Current Outpatient Prescriptions  Medication Sig Dispense Refill  . acetaminophen (TYLENOL) 500 MG tablet Take 500 mg by mouth every 4 (four) hours as needed for mild pain, moderate pain, fever or headache.     Marland Kitchen alum & mag hydroxide-simeth (MINTOX) 200-200-20 MG/5ML suspension Take 30 mLs by mouth as needed for indigestion or heartburn.     . baclofen (LIORESAL) 10 MG tablet Take 10 mg by mouth 2 (two) times daily.    . brimonidine (ALPHAGAN P) 0.1 % SOLN Place 1 drop into both eyes 2 (two) times daily.    . brinzolamide (AZOPT) 1 % ophthalmic suspension Place 1 drop into both eyes 2 (two) times daily.    . Calcium Carbonate-Vitamin D (CALCIUM-D) 600-400 MG-UNIT TABS Take 1 tablet by mouth daily with breakfast.     . cephALEXin (KEFLEX) 250 MG capsule Take 1 capsule (250 mg total) by mouth 4 (four) times daily. (Patient not taking: Reported on 08/10/2016) 28 capsule 0  . divalproex (DEPAKOTE) 125 MG DR tablet 2 capsules twice a day for 2 weeks, then take 4 capsules twice a day (Patient taking differently: Take 500 mg by mouth 2 (two) times daily. ) 240 tablet 5  . guaiFENesin (ROBITUSSIN) 100 MG/5ML liquid Take 200 mg by mouth every 6 (six) hours as needed for cough.    . levETIRAcetam (KEPPRA) 1000 MG tablet Take 1 tablet (1,000 mg total) by mouth 2 (two) times daily. 60 tablet 11  .  lisinopril-hydrochlorothiazide (PRINZIDE,ZESTORETIC) 20-12.5 MG tablet Take 1 tablet by mouth daily with breakfast.     . loperamide (IMODIUM) 2 MG capsule Take 2 mg by mouth as needed for diarrhea or loose stools.    Marland Kitchen loratadine (CLARITIN) 10 MG tablet Take 10 mg by mouth daily with breakfast.     . LORazepam (ATIVAN) 0.5 MG tablet Take 0.5 mg by mouth every 8 (eight) hours as needed for anxiety.    . magnesium hydroxide (MILK OF MAGNESIA) 400 MG/5ML suspension Take 30 mLs by mouth at bedtime as needed for mild constipation.    . meloxicam (MOBIC) 15 MG tablet Take 15 mg by mouth daily.    . mirtazapine (REMERON) 30 MG tablet Take 30 mg by mouth at bedtime.    . mometasone (NASONEX) 50 MCG/ACT nasal spray Place 2 sprays into the nose daily after breakfast.     . neomycin-bacitracin-polymyxin (NEOSPORIN) [phone] ointment Apply 1 application topically as needed (for wound care).     . Nutritional Supplements (NUTRITIONAL SHAKE PO) Take 1 Can by mouth 3 (three) times daily. Mighty Shakes    . sertraline (ZOLOFT) 100 MG tablet Take 100 mg by mouth daily.    . sertraline (ZOLOFT) 50 MG tablet Take 1 tablet (50 mg total) by mouth daily. (Patient not taking: Reported on 08/23/2016) 30 tablet 12  . simvastatin (ZOCOR) 10 MG tablet Take 10 mg by mouth at bedtime.    . Skin Protectants, Misc. (MINERIN) CREA Apply 1 application topically at bedtime. Applies to whole body    . Travoprost, BAK Free, (TRAVATAN Z) 0.004 % SOLN ophthalmic solution Place 1 drop into both eyes at bedtime.      Musculoskeletal: Strength & Muscle Tone: within normal limits Gait & Station: unsteady, Needs assistance; unable to do alone Patient leans: N/A  Psychiatric  Specialty Exam: Patient is non verbal unable to obtain a PSE evaluation.   Physical Exam  ROS  Blood pressure (!) 131/57, pulse (!) 59, temperature 98.1 F (36.7 C), temperature source Rectal, resp. rate 16, height _0  (1.575 m), weight 63.5 kg (140 lb), SpO2  100 %.Body mass index is 25.61 kg/m.  General Appearance: Fairly Groomed  Eye Contact:  None  Speech:  Non verbal (not speaking)  Volume:  Non verbal (not speaking)  Mood:  Unable to determine at this time.  Patient nonverbal   Affect:  Flat  Thought Process:  Unable to determine at this time.  Patient nonverbal   Orientation:  Unable to determine at this time.  Patient nonverbal   Thought Content:  Unable to determine at this time.  Patient nonverbal   Suicidal Thoughts:  Unable to determine at this time.  Patient nonverbal   Homicidal Thoughts:  Unable to determine at this time.  Patient nonverbal   Memory:  Unable to determine at this time.  Patient nonverbal   Judgement:  Poor  Insight:  Unable to determine at this time.  Patient nonverbal   Psychomotor Activity:  Decreased  Concentration:  Concentration: Poor and Attention Span: Poor  Recall:  Unable to determine at this time.  Patient nonverbal   Fund of Knowledge:  Unable to determine at this time.  Patient nonverbal   Language:  Unable to determine at this time.  Patient nonverbal   Akathisia:  No  Handed:  Right  AIMS (if indicated):     Assets:  Housing Social Support  ADL's:  Impaired  Cognition:  History of dementia (nonverbal)  Sleep:        Treatment Plan Summary:  Patient has been psychiatrically cleared.  Recommend neurology consult related to progression of Dementia or possible related to fall      Disposition: No evidence of imminent risk to self or others at present.   Patient does not meet criteria for psychiatric inpatient admission.  EDP may discharge when cleared medically.  Recommend neurology consult    .  Dawnna Gritz, NP 08/25/2016 2:02 PM

## 2016-08-26 DIAGNOSIS — R410 Disorientation, unspecified: Secondary | ICD-10-CM | POA: Diagnosis not present

## 2016-08-26 NOTE — Progress Notes (Addendum)
CSW spoke with staff member Alexandria Lowe [phone]) from [facility]. Staff requested to be contacted after patient is seen by PT and informed about recommendations. Staff reports that patient being unsteady is not her baseline and that patient usually walks non-stop. Staff reports that depending on patient's needed level of care patient may be able to return. CSW will follow up with staff after patient is seen by PT.   CSW contacted staff member Alexandria Lowe and informed about the PT recommendation from 08/25/2016. Staff requested to be updated after patient receives MRI. Staff reported that she wanted to be sure the facility could care for patient if patient returns to current ALF.    Alexandria Lowe, LCSWA [name]  Clinical Social Worker [phone]

## 2016-08-26 NOTE — Progress Notes (Signed)
Patient awaiting neurology clearance. MRI attempted unsuccessful. [facility] requested to be updated contact person Roylene Reason [phone]). Patient's sister Edwin Cap) provided with update. CSW will follow up with ALF staff tomorrow 08/27/2016 to provide update and patient will potentially return to current ALF once cleared by neurology.   [name], LCSWA [name]  Clinical Social Worker [phone]

## 2016-08-26 NOTE — ED Notes (Signed)
Pt does not swallow pill; pt chews them.  Pt is unable to follow command to swallow in lieu of chewing pills.

## 2016-08-26 NOTE — Progress Notes (Signed)
Tried to place call to MRI regarding status of test.  No answer.

## 2016-08-26 NOTE — ED Provider Notes (Signed)
Nursing came to me with concerns about the patient's disposition. Summarize the notes. Patient was in an assisted living situation sent in from nursing home because of frequent falls and abnormal behavior. Patient's initial workup without significant findings. Hospitalists were consult did drop for possible medical admission they felt patient did not meet criteria. They recommended behavioral health evaluation which occurred. Behavioral health recommended neurology evaluation. Dr. Ranae Palms yesterday did talk to behavioral health and they recommended MRI.   Social worker has been involved. She is waiting for neurology's consultation and MRI results. However patient may just require rehabilitative nursing home type placement. MRI not available now. Social worker not on today.  In the morning would recommend reattempt at MRI and would have social worker reevaluate the overall situation.   Vanetta Mulders, MD 08/26/16 1940

## 2016-08-26 NOTE — Progress Notes (Signed)
Tried to reach MRI again regarding status of this test for patient.  No answer.

## 2016-08-27 ENCOUNTER — Emergency Department (HOSPITAL_COMMUNITY): Payer: Medicare HMO

## 2016-08-27 DIAGNOSIS — R410 Disorientation, unspecified: Secondary | ICD-10-CM | POA: Diagnosis not present

## 2016-08-27 MED ORDER — ZIPRASIDONE MESYLATE 20 MG IM SOLR: 20.0000 mg | Freq: Once | INTRAMUSCULAR | Status: DC

## 2016-08-27 MED ORDER — ZIPRASIDONE MESYLATE 20 MG IM SOLR
10.0000 mg | Freq: Once | INTRAMUSCULAR | Status: AC
  Administered 2016-08-27: 10 mg via INTRAMUSCULAR
  Filled 2016-08-27: qty 20

## 2016-08-27 NOTE — Progress Notes (Signed)
Patient is ready to return to [facility] at this time. Number for report is [phone]  Stacy Gardner, [facility] Clinical Social Worker [phone]

## 2016-08-27 NOTE — Progress Notes (Addendum)
CSW spoke with Alexandria Lowe at [facility]- patient is able to return today due to normal MRI. CSW sent AVS to fax number provided. CSW will follow up with legal guardian/ sister/son and [name] to determine when they will be ready for patient.   Stacy Gardner, LCSWA Clinical Social Worker [phone]

## 2016-08-27 NOTE — Progress Notes (Signed)
qPhysical Therapy Treatment Patient Details Name: Alexandria Lowe MRN: [id number] DOB: 1956-02-18 Today's Date: 08/27/2016    History of Present Illness 61 yo Female currently in [facility] care unit and up until redcently she has been independent with ambulation. Recent with 3 subsequent admissions with falls, "not herself" , walking into wall, yelling out. h/o dementia.     PT Comments    Patient very lethargic, hqad medication earlier which may be cause. Plans Return to ALF.    Follow Up Recommendations  SNF (plans return to ALF)     Equipment Recommendations       Recommendations for Other Services       Precautions / Restrictions Precautions Precautions: Fall    Mobility  Bed Mobility   Bed Mobility: Supine to Sit;Sit to Supine     Supine to sit: Max assist;+2 for physical assistance Sit to supine: Max assist;+2 for physical assistance   General bed mobility comments: pt did not initiate, did not foloow commands for movbility.   Transfers Overall transfer level: Needs assistance Equipment used: 2 person hand held assist Transfers: Sit to/from Stand Sit to Stand: +2 physical assistance;Max assist;+2 safety/equipment         General transfer comment: pt. did bear weight through legs but nbalance wasrequired assist to stay standing. attempted side steps but unable. attempted x 2.  Ambulation/Gait                 Stairs            Wheelchair Mobility    Modified Rankin (Stroke Patients Only)       Balance                                            Cognition Arousal/Alertness: Lethargic;Suspect due to medications   Overall Cognitive Status: Difficult to assess                                        Exercises      General Comments        Pertinent Vitals/Pain Pain Assessment: Faces Faces Pain Scale: No hurt    Home Living                      Prior Function            PT Goals  (current goals can now be found in the care plan section) Progress towards PT goals: Not progressing toward goals - comment    Frequency    Min 3X/week      PT Plan Current plan remains appropriate    Co-evaluation             End of Session   Activity Tolerance: Patient limited by lethargy Patient left: in bed;with call [place]/phone within reach Nurse Communication: Mobility status PT Visit Diagnosis: Unsteadiness on feet (R26.81);Repeated falls (R29.6)     Time: 4098-1191 PT Time Calculation (min) (ACUTE ONLY): 9 min  Charges:  $Therapeutic Activity: 8-22 mins                    G CodesBlanchard Kelch PT [phone]   Rada Hay 08/27/2016, 5:31 PM

## 2016-08-27 NOTE — Discharge Instructions (Signed)
Return back to the nursing home. Follow-up with her primary care doctor

## 2016-09-04 ENCOUNTER — Encounter: Payer: Self-pay | Admitting: Adult Health

## 2016-09-04 ENCOUNTER — Ambulatory Visit (INDEPENDENT_AMBULATORY_CARE_PROVIDER_SITE_OTHER): Payer: Medicare HMO | Admitting: Adult Health

## 2016-09-04 VITALS — Wt 134.2 lb

## 2016-09-04 DIAGNOSIS — R569 Unspecified convulsions: Secondary | ICD-10-CM | POA: Diagnosis not present

## 2016-09-04 DIAGNOSIS — F0281 Dementia in other diseases classified elsewhere with behavioral disturbance: Secondary | ICD-10-CM | POA: Diagnosis not present

## 2016-09-04 DIAGNOSIS — F02818 Dementia in other diseases classified elsewhere, unspecified severity, with other behavioral disturbance: Secondary | ICD-10-CM

## 2016-09-04 DIAGNOSIS — G308 Other Alzheimer's disease: Secondary | ICD-10-CM | POA: Diagnosis not present

## 2016-09-04 MED ORDER — DIVALPROEX SODIUM 125 MG PO DR TAB: 625.0000 mg | DELAYED_RELEASE_TABLET | Freq: Two times a day (BID) | ORAL | 5 refills | Status: DC

## 2016-09-04 NOTE — Patient Instructions (Signed)
Increase Depakote to 5 tablets (625 mg) twice a day If your symptoms worsen or you develop new symptoms please let us know.

## 2016-09-04 NOTE — Progress Notes (Signed)
I have read the note, and I agree with the clinical assessment and plan.  Janetta Vandoren KEITH   

## 2016-09-04 NOTE — Progress Notes (Signed)
PATIENT: Alexandria Lowe DOB: 08-28-1955  REASON FOR VISIT: follow up- memory, seizures HISTORY FROM: caregiver  HISTORY OF PRESENT ILLNESS: Alexandria Lowe is a 61 year old female with a history of end-stage Alzheimer's disease and seizures. She returns today for follow-up. At the last visit Depakote 500 mg BID was added on to her medication regimen. The patient remains non-verbal. The caregiver is with her today. She reports that the patient had a fall approximately 1-1/2 weeks ago. She states that the fall was unwitnessed. She was found facedown in her bed. Caregiver reports in the past when she's had falls they are usually due to a seizure. She reports that the patient does not have any difficulty with ambulation. She was taken to the emergency room after the fall. MRI of the brain was relatively unremarkable. Caregiver states that since the fall she seems to be more agitated and uncooperative. She reports it seems that her comprehension since the fall has worsened. Caregiver reports that her son is the power of attorney but he is not very involved in her care. The patient is nonverbal. Since the fall she is ambulating. She acquires assistance with most ADLs. She returns today for an evaluation.   HISTORY 07/03/16:Alexandria Lowe is a 61 year old right-handed black female with a history of end-stage Alzheimer's disease and a history of seizures. The patient has been in the emergency room on 2 occasions since last seen, once on 04/02/2016 and again on 11/06/2016. The patient comes into the office today with a caretaker. The patient generally will have some sort of an episode of agitation, then she may go into a seizure. The patient fell with the last seizure event necessitating an emergency room visit. The patient is on Keppra taking 1000 mg twice daily. The patient seems to benefit as well from the use of Ativan for the agitation. The patient is minimally verbal, she is unable to communicate her needs. The patient  at times may refuse her medications making it difficult to treat her seizures. The patient returns to this office for an evaluation.    REVIEW OF SYSTEMS: Out of a complete 14 system review of symptoms, the patient complains only of the following symptoms, and all other reviewed systems are negative.  See history of present illness  ALLERGIES: No Known Allergies  HOME MEDICATIONS: Outpatient Medications Prior to Visit  Medication Sig Dispense Refill  . acetaminophen (TYLENOL) 500 MG tablet Take 500 mg by mouth every 4 (four) hours as needed for mild pain, moderate pain, fever or headache.     Marland Kitchen alum & mag hydroxide-simeth (MINTOX) 200-200-20 MG/5ML suspension Take 30 mLs by mouth as needed for indigestion or heartburn.     . baclofen (LIORESAL) 10 MG tablet Take 10 mg by mouth 2 (two) times daily.    . brimonidine (ALPHAGAN P) 0.1 % SOLN Place 1 drop into both eyes 2 (two) times daily.    . brinzolamide (AZOPT) 1 % ophthalmic suspension Place 1 drop into both eyes 2 (two) times daily.    . Calcium Carbonate-Vitamin D (CALCIUM-D) 600-400 MG-UNIT TABS Take 1 tablet by mouth daily with breakfast.     . divalproex (DEPAKOTE) 125 MG DR tablet 2 capsules twice a day for 2 weeks, then take 4 capsules twice a day (Patient taking differently: Take 500 mg by mouth 2 (two) times daily. ) 240 tablet 5  . guaiFENesin (ROBITUSSIN) 100 MG/5ML liquid Take 200 mg by mouth every 6 (six) hours as needed for cough.    Marland Kitchen  lisinopril-hydrochlorothiazide (PRINZIDE,ZESTORETIC) 20-12.5 MG tablet Take 1 tablet by mouth daily with breakfast.     . loperamide (IMODIUM) 2 MG capsule Take 2 mg by mouth as needed for diarrhea or loose stools.    Marland Kitchen loratadine (CLARITIN) 10 MG tablet Take 10 mg by mouth daily with breakfast.     . LORazepam (ATIVAN) 0.5 MG tablet Take 0.5 mg by mouth every 8 (eight) hours as needed for anxiety.    . magnesium hydroxide (MILK OF MAGNESIA) 400 MG/5ML suspension Take 30 mLs by mouth at bedtime  as needed for mild constipation.    . meloxicam (MOBIC) 15 MG tablet Take 15 mg by mouth daily.    . mirtazapine (REMERON) 30 MG tablet Take 30 mg by mouth at bedtime.    . mometasone (NASONEX) 50 MCG/ACT nasal spray Place 2 sprays into the nose daily after breakfast.     . neomycin-bacitracin-polymyxin (NEOSPORIN) [phone] ointment Apply 1 application topically as needed (for wound care).     . Nutritional Supplements (NUTRITIONAL SHAKE PO) Take 1 Can by mouth 3 (three) times daily. Mighty Shakes    . sertraline (ZOLOFT) 100 MG tablet Take 100 mg by mouth daily.    . sertraline (ZOLOFT) 50 MG tablet Take 1 tablet (50 mg total) by mouth daily. (Patient not taking: Reported on 08/23/2016) 30 tablet 12  . simvastatin (ZOCOR) 10 MG tablet Take 10 mg by mouth at bedtime.    . Skin Protectants, Misc. (MINERIN) CREA Apply 1 application topically at bedtime. Applies to whole body    . Travoprost, BAK Free, (TRAVATAN Z) 0.004 % SOLN ophthalmic solution Place 1 drop into both eyes at bedtime.     No facility-administered medications prior to visit.     PAST MEDICAL HISTORY: Past Medical History:  Diagnosis Date  . Alzheimer's disease 06/17/2014  . Convulsions/seizures (HCC) 06/17/2014  . Depression   . Hyperlipidemia   . Hypertension   . Nonverbal    "for awhile now"/sister (06/01/2015)    PAST SURGICAL HISTORY: Past Surgical History:  Procedure Laterality Date  . DILATION AND CURETTAGE OF UTERUS    . VAGINAL HYSTERECTOMY      FAMILY HISTORY: Family History  Problem Relation Age of Onset  . Cancer Mother     lung  . Hypertension Sister   . Thyroid disease Sister   . Hypertension Brother   . Diabetes Brother     SOCIAL HISTORY: Social History   Social History  . Marital status: Widowed    Spouse name: N/A  . Number of children: 1  . Years of education: N/A   Occupational History  . Disabled    Social History Main Topics  . Smoking status: Never Smoker  . Smokeless  tobacco: Never Used  . Alcohol use No  . Drug use: No  . Sexual activity: Not on file   Other Topics Concern  . Not on file   Social History Narrative   Patient is right handed   Patient lives at [facility].      PHYSICAL EXAM  Vitals:   09/04/16 1128  Weight: 134 lb 3.2 oz (60.9 kg)   Body mass index is 24.55 kg/m.  Generalized: Well developed, in no acute distress   Neurological examination  Mentation: Alert. She does not follow commands. Patient is nonverbal. Cranial nerve II-XII: Pupils were equal round reactive to light. Extraocular movements were full. Facial sensation and strength were normal.  Head turning and shoulder shrug  were normal and  symmetric. Motor: Good strength throughout. Sensory: Sensory testing is intact to soft touch on all 4 extremities. No evidence of extinction is noted.  Coordination: Unable to test Gait and station: Gait is normal.  Reflexes: Unable to test  DIAGNOSTIC DATA (LABS, IMAGING, TESTING) - I reviewed patient records, labs, notes, testing and imaging myself where available.  Lab Results  Component Value Date   WBC 6.9 08/24/2016   HGB 11.6 (L) 08/24/2016   HCT 34.6 (L) 08/24/2016   MCV 77.9 (L) 08/24/2016   PLT 148 (L) 08/24/2016      Component Value Date/Time   NA 143 08/24/2016 1751   K 4.1 08/24/2016 1751   CL 111 08/24/2016 1751   CO2 26 08/24/2016 1751   GLUCOSE 76 08/24/2016 1751   BUN 16 08/24/2016 1751   CREATININE 0.97 08/24/2016 1751   CALCIUM 9.2 08/24/2016 1751   PROT 6.9 08/24/2016 1751   ALBUMIN 3.6 08/24/2016 1751   AST 30 08/24/2016 1751   ALT 16 08/24/2016 1751   ALKPHOS 63 08/24/2016 1751   BILITOT 0.6 08/24/2016 1751   GFRNONAA >60 08/24/2016 1751   GFRAA >60 08/24/2016 1751   No results found for: CHOL, HDL, LDLCALC, LDLDIRECT, TRIG, CHOLHDL Lab Results  Component Value Date   HGBA1C 5.2 11/07/2015   Lab Results  Component Value Date   VITAMINB12 289 11/06/2015        ASSESSMENT AND PLAN 61 y.o. year old female  has a past medical history of Alzheimer's disease (06/17/2014); Convulsions/seizures (HCC) (06/17/2014); Depression; Hyperlipidemia; Hypertension; and Nonverbal. here with:  1. Alzheimer's disease 2. Seizures  The patient had a recent fall that was unwitnessed. Caregiver seems to think this may be due to a seizure event. I will increase Depakote. She is currently taking Depakote 125 mg tablet- 4 tablets twice a day. I will increase to 5 tablets twice a day. The patient's recent Depakote level was subtherapeutic. For now she will continue on Keppra. Advised that if her symptoms worsen or she develops new symptoms she should  let us know. She will follow-up in 6 months or sooner if needed.    Butch Penny, MSN, NP-C 09/04/2016, 11:11 AM [facility] [address] [place], [place] [address] [phone]

## 2016-10-27 ENCOUNTER — Inpatient Hospital Stay (HOSPITAL_COMMUNITY)
Admission: EM | Admit: 2016-10-27 | Discharge: 2016-11-01 | DRG: 689 | Disposition: A | Discharge status: Skilled Nursing Facility | Payer: Medicare HMO | Attending: Internal Medicine | Admitting: Internal Medicine

## 2016-10-27 ENCOUNTER — Encounter (HOSPITAL_COMMUNITY): Payer: Self-pay | Admitting: Family Medicine

## 2016-10-27 ENCOUNTER — Emergency Department (HOSPITAL_COMMUNITY): Payer: Medicare HMO

## 2016-10-27 DIAGNOSIS — N390 Urinary tract infection, site not specified: Secondary | ICD-10-CM | POA: Diagnosis not present

## 2016-10-27 DIAGNOSIS — R4189 Other symptoms and signs involving cognitive functions and awareness: Secondary | ICD-10-CM | POA: Diagnosis present

## 2016-10-27 DIAGNOSIS — R569 Unspecified convulsions: Secondary | ICD-10-CM

## 2016-10-27 DIAGNOSIS — G309 Alzheimer's disease, unspecified: Secondary | ICD-10-CM | POA: Diagnosis present

## 2016-10-27 DIAGNOSIS — Z6822 Body mass index (BMI) 22.0-22.9, adult: Secondary | ICD-10-CM

## 2016-10-27 DIAGNOSIS — I10 Essential (primary) hypertension: Secondary | ICD-10-CM | POA: Diagnosis present

## 2016-10-27 DIAGNOSIS — Y92009 Unspecified place in unspecified non-institutional (private) residence as the place of occurrence of the external cause: Secondary | ICD-10-CM

## 2016-10-27 DIAGNOSIS — D696 Thrombocytopenia, unspecified: Secondary | ICD-10-CM | POA: Diagnosis not present

## 2016-10-27 DIAGNOSIS — G92 Toxic encephalopathy: Secondary | ICD-10-CM | POA: Diagnosis present

## 2016-10-27 DIAGNOSIS — N183 Chronic kidney disease, stage 3 unspecified: Secondary | ICD-10-CM | POA: Diagnosis present

## 2016-10-27 DIAGNOSIS — E440 Moderate protein-calorie malnutrition: Secondary | ICD-10-CM | POA: Diagnosis present

## 2016-10-27 DIAGNOSIS — Y92129 Unspecified place in nursing home as the place of occurrence of the external cause: Secondary | ICD-10-CM

## 2016-10-27 DIAGNOSIS — N179 Acute kidney failure, unspecified: Secondary | ICD-10-CM | POA: Diagnosis present

## 2016-10-27 DIAGNOSIS — Z79899 Other long term (current) drug therapy: Secondary | ICD-10-CM

## 2016-10-27 DIAGNOSIS — F0390 Unspecified dementia without behavioral disturbance: Secondary | ICD-10-CM | POA: Diagnosis present

## 2016-10-27 DIAGNOSIS — N3000 Acute cystitis without hematuria: Secondary | ICD-10-CM | POA: Diagnosis not present

## 2016-10-27 DIAGNOSIS — F329 Major depressive disorder, single episode, unspecified: Secondary | ICD-10-CM | POA: Diagnosis present

## 2016-10-27 DIAGNOSIS — R404 Transient alteration of awareness: Secondary | ICD-10-CM | POA: Diagnosis not present

## 2016-10-27 DIAGNOSIS — W19XXXA Unspecified fall, initial encounter: Secondary | ICD-10-CM

## 2016-10-27 DIAGNOSIS — S0003XA Contusion of scalp, initial encounter: Secondary | ICD-10-CM

## 2016-10-27 DIAGNOSIS — F0280 Dementia in other diseases classified elsewhere without behavioral disturbance: Secondary | ICD-10-CM | POA: Diagnosis present

## 2016-10-27 DIAGNOSIS — G929 Unspecified toxic encephalopathy: Secondary | ICD-10-CM | POA: Diagnosis present

## 2016-10-27 DIAGNOSIS — Z8669 Personal history of other diseases of the nervous system and sense organs: Secondary | ICD-10-CM

## 2016-10-27 DIAGNOSIS — I129 Hypertensive chronic kidney disease with stage 1 through stage 4 chronic kidney disease, or unspecified chronic kidney disease: Secondary | ICD-10-CM | POA: Diagnosis present

## 2016-10-27 DIAGNOSIS — N289 Disorder of kidney and ureter, unspecified: Secondary | ICD-10-CM

## 2016-10-27 HISTORY — DX: Essential (primary) hypertension: I10

## 2016-10-27 HISTORY — DX: Moderate protein-calorie malnutrition: E44.0

## 2016-10-27 HISTORY — DX: Thrombocytopenia, unspecified: D69.6

## 2016-10-27 HISTORY — DX: Anemia, unspecified: D64.9

## 2016-10-27 HISTORY — DX: Chronic kidney disease, stage 3 (moderate): N18.3

## 2016-10-27 HISTORY — DX: Unspecified fracture of shaft of humerus, right arm, initial encounter for closed fracture: S42.301A

## 2016-10-27 LAB — URINALYSIS, ROUTINE W REFLEX MICROSCOPIC
Bilirubin Urine: NEGATIVE
GLUCOSE, UA: NEGATIVE mg/dL
HGB URINE DIPSTICK: NEGATIVE
KETONES UR: 5 mg/dL — AB
NITRITE: NEGATIVE
PH: 6 (ref 5.0–8.0)
Protein, ur: NEGATIVE mg/dL
Specific Gravity, Urine: 1.018 (ref 1.005–1.030)

## 2016-10-27 LAB — CBC WITH DIFFERENTIAL/PLATELET
Basophils Absolute: 0 10*3/uL (ref 0.0–0.1)
Basophils Relative: 0 %
Eosinophils Absolute: 0 10*3/uL (ref 0.0–0.7)
Eosinophils Relative: 0 %
HEMATOCRIT: 35.9 % — AB (ref 36.0–46.0)
HEMOGLOBIN: 11.5 g/dL — AB (ref 12.0–15.0)
LYMPHS ABS: 1.8 10*3/uL (ref 0.7–4.0)
Lymphocytes Relative: 23 %
MCH: 27.5 pg (ref 26.0–34.0)
MCHC: 32 g/dL (ref 30.0–36.0)
MCV: 85.9 fL (ref 78.0–100.0)
MONO ABS: 0.6 10*3/uL (ref 0.1–1.0)
Monocytes Relative: 8 %
NEUTROS ABS: 5.2 10*3/uL (ref 1.7–7.7)
NEUTROS PCT: 68 %
Platelets: 104 10*3/uL — ABNORMAL LOW (ref 150–400)
RBC: 4.18 MIL/uL (ref 3.87–5.11)
RDW: 17.7 % — AB (ref 11.5–15.5)
WBC: 7.6 10*3/uL (ref 4.0–10.5)

## 2016-10-27 LAB — COMPREHENSIVE METABOLIC PANEL
ALK PHOS: 45 U/L (ref 38–126)
ALT: 34 U/L (ref 14–54)
ANION GAP: 9 (ref 5–15)
AST: 43 U/L — ABNORMAL HIGH (ref 15–41)
Albumin: 3.1 g/dL — ABNORMAL LOW (ref 3.5–5.0)
BILIRUBIN TOTAL: 0.6 mg/dL (ref 0.3–1.2)
BUN: 26 mg/dL — ABNORMAL HIGH (ref 6–20)
CALCIUM: 9.1 mg/dL (ref 8.9–10.3)
CO2: 26 mmol/L (ref 22–32)
Chloride: 107 mmol/L (ref 101–111)
Creatinine, Ser: 1.33 mg/dL — ABNORMAL HIGH (ref 0.44–1.00)
GFR, EST AFRICAN AMERICAN: 49 mL/min — AB (ref >60)
GFR, EST NON AFRICAN AMERICAN: 42 mL/min — AB (ref >60)
Glucose, Bld: 88 mg/dL (ref 65–99)
POTASSIUM: 4.1 mmol/L (ref 3.5–5.1)
Sodium: 142 mmol/L (ref 135–145)
TOTAL PROTEIN: 6.6 g/dL (ref 6.5–8.1)

## 2016-10-27 LAB — CK: Total CK: 131 U/L (ref 38–234)

## 2016-10-27 LAB — VALPROIC ACID LEVEL: VALPROIC ACID LVL: 80 ug/mL (ref 50.0–100.0)

## 2016-10-27 MED ORDER — ONDANSETRON HCL 4 MG PO TABS: 4.0000 mg | ORAL_TABLET | Freq: Four times a day (QID) | ORAL | Status: DC | PRN

## 2016-10-27 MED ORDER — HYDRALAZINE HCL 20 MG/ML IJ SOLN: 10.0000 mg | INTRAMUSCULAR | Status: DC | PRN

## 2016-10-27 MED ORDER — BRIMONIDINE TARTRATE 0.15 % OP SOLN
1.0000 [drp] | Freq: Two times a day (BID) | OPHTHALMIC | Status: DC
  Administered 2016-10-27 – 2016-10-31 (×6): 1 [drp] via OPHTHALMIC
  Filled 2016-10-27: qty 5

## 2016-10-27 MED ORDER — ACETAMINOPHEN 325 MG PO TABS: 650.0000 mg | ORAL_TABLET | Freq: Four times a day (QID) | ORAL | Status: DC | PRN

## 2016-10-27 MED ORDER — CALCIUM CARBONATE-VITAMIN D 500-200 MG-UNIT PO TABS
1.0000 | ORAL_TABLET | Freq: Every day | ORAL | Status: DC
  Filled 2016-10-27: qty 1

## 2016-10-27 MED ORDER — LIDOCAINE 4 % EX PTCH: 1.0000 | MEDICATED_PATCH | Freq: Two times a day (BID) | CUTANEOUS | Status: DC

## 2016-10-27 MED ORDER — NUTRITIONAL SHAKE PO LIQD: Freq: Three times a day (TID) | ORAL | Status: DC

## 2016-10-27 MED ORDER — MAGNESIUM HYDROXIDE 400 MG/5ML PO SUSP: 30.0000 mL | Freq: Every evening | ORAL | Status: DC | PRN

## 2016-10-27 MED ORDER — MIRTAZAPINE 30 MG PO TABS
30.0000 mg | ORAL_TABLET | Freq: Every day | ORAL | Status: DC
  Administered 2016-10-27: 30 mg via ORAL
  Filled 2016-10-27: qty 1
  Filled 2016-10-27: qty 2

## 2016-10-27 MED ORDER — GUAIFENESIN 100 MG/5ML PO SOLN
200.0000 mg | Freq: Four times a day (QID) | ORAL | Status: DC | PRN
  Filled 2016-10-27: qty 10

## 2016-10-27 MED ORDER — SODIUM CHLORIDE 0.9 % IV SOLN
INTRAVENOUS | Status: AC
  Administered 2016-10-27: 23:00:00 via INTRAVENOUS

## 2016-10-27 MED ORDER — LORAZEPAM 0.5 MG PO TABS: 0.5000 mg | ORAL_TABLET | Freq: Three times a day (TID) | ORAL | Status: DC | PRN

## 2016-10-27 MED ORDER — POLYETHYLENE GLYCOL 3350 17 G PO PACK: 17.0000 g | PACK | Freq: Every day | ORAL | Status: DC | PRN

## 2016-10-27 MED ORDER — CEFTRIAXONE SODIUM 1 G IJ SOLR
1.0000 g | Freq: Once | INTRAMUSCULAR | Status: AC
  Administered 2016-10-27: 1 g via INTRAVENOUS
  Filled 2016-10-27: qty 10

## 2016-10-27 MED ORDER — SODIUM CHLORIDE 0.9 % IV BOLUS (SEPSIS)
1000.0000 mL | Freq: Once | INTRAVENOUS | Status: AC
  Administered 2016-10-27: 1000 mL via INTRAVENOUS

## 2016-10-27 MED ORDER — FLUTICASONE PROPIONATE 50 MCG/ACT NA SUSP
2.0000 | Freq: Every day | NASAL | Status: DC
  Administered 2016-10-29 – 2016-10-31 (×3): 2 via NASAL
  Filled 2016-10-27: qty 16

## 2016-10-27 MED ORDER — ACETAMINOPHEN 650 MG RE SUPP: 650.0000 mg | Freq: Four times a day (QID) | RECTAL | Status: DC | PRN

## 2016-10-27 MED ORDER — LIDOCAINE 5 % EX PTCH
1.0000 | MEDICATED_PATCH | CUTANEOUS | Status: DC
  Administered 2016-10-27 – 2016-10-31 (×4): 1 via TRANSDERMAL
  Filled 2016-10-27 (×5): qty 1

## 2016-10-27 MED ORDER — BRINZOLAMIDE 1 % OP SUSP
1.0000 [drp] | Freq: Two times a day (BID) | OPHTHALMIC | Status: DC
  Administered 2016-10-27 – 2016-10-31 (×7): 1 [drp] via OPHTHALMIC
  Filled 2016-10-27: qty 10

## 2016-10-27 MED ORDER — LORATADINE 10 MG PO TABS
10.0000 mg | ORAL_TABLET | Freq: Every day | ORAL | Status: DC
  Filled 2016-10-27: qty 1

## 2016-10-27 MED ORDER — ONDANSETRON HCL 4 MG/2ML IJ SOLN
4.0000 mg | Freq: Four times a day (QID) | INTRAMUSCULAR | Status: DC | PRN
Start: 2016-10-27 — End: 2016-11-01

## 2016-10-27 MED ORDER — VALPROATE SODIUM 250 MG/5ML PO SOLN
625.0000 mg | Freq: Two times a day (BID) | ORAL | Status: DC
  Administered 2016-10-27 – 2016-11-01 (×9): 625 mg via ORAL
  Filled 2016-10-27 (×11): qty 15

## 2016-10-27 MED ORDER — MINERIN CREME EX CREA: 1.0000 "application " | TOPICAL_CREAM | Freq: Every day | CUTANEOUS | Status: DC

## 2016-10-27 MED ORDER — SERTRALINE HCL 100 MG PO TABS
100.0000 mg | ORAL_TABLET | Freq: Every day | ORAL | Status: DC
  Filled 2016-10-27: qty 1

## 2016-10-27 MED ORDER — LATANOPROST 0.005 % OP SOLN
1.0000 [drp] | Freq: Every day | OPHTHALMIC | Status: DC
  Administered 2016-10-27 – 2016-10-31 (×5): 1 [drp] via OPHTHALMIC
  Filled 2016-10-27: qty 2.5

## 2016-10-27 MED ORDER — ALUM & MAG HYDROXIDE-SIMETH 200-200-20 MG/5ML PO SUSP
30.0000 mL | ORAL | Status: DC | PRN
Start: 2016-10-27 — End: 2016-11-01

## 2016-10-27 MED ORDER — ENSURE ENLIVE PO LIQD: 237.0000 mL | Freq: Three times a day (TID) | ORAL | Status: DC

## 2016-10-27 MED ORDER — HYDROCODONE-ACETAMINOPHEN 5-325 MG PO TABS: 1.0000 | ORAL_TABLET | ORAL | Status: DC | PRN

## 2016-10-27 MED ORDER — LEVETIRACETAM 100 MG/ML PO SOLN
750.0000 mg | Freq: Two times a day (BID) | ORAL | Status: DC
  Administered 2016-10-27: 750 mg via ORAL
  Filled 2016-10-27 (×2): qty 7.5

## 2016-10-27 MED ORDER — TRIPLE ANTIBIOTIC 5-400-5000 EX OINT: 1.0000 "application " | TOPICAL_OINTMENT | CUTANEOUS | Status: DC | PRN

## 2016-10-27 MED ORDER — BACITRACIN-NEOMYCIN-POLYMYXIN OINTMENT TUBE
1.0000 "application " | TOPICAL_OINTMENT | CUTANEOUS | Status: DC | PRN
  Filled 2016-10-27: qty 14.17

## 2016-10-27 MED ORDER — ENOXAPARIN SODIUM 40 MG/0.4ML ~~LOC~~ SOLN
40.0000 mg | SUBCUTANEOUS | Status: DC
  Administered 2016-10-27 – 2016-10-31 (×5): 40 mg via SUBCUTANEOUS
  Filled 2016-10-27 (×5): qty 0.4

## 2016-10-27 MED ORDER — HYDROCERIN EX CREA
TOPICAL_CREAM | Freq: Every day | CUTANEOUS | Status: DC
  Administered 2016-10-29 – 2016-10-31 (×3): via TOPICAL
  Filled 2016-10-27: qty 113

## 2016-10-27 NOTE — ED Notes (Signed)
Per nursing home, patient usually will keep head down, does respond but responds slowly, able to follow commands, and is usually able to walk around.  Pt does have a son and sister that visits.

## 2016-10-27 NOTE — ED Notes (Signed)
admitting Provider at bedside. 

## 2016-10-27 NOTE — ED Notes (Signed)
CT contacted about delay in CT scan. They state she is next to go.

## 2016-10-27 NOTE — ED Provider Notes (Signed)
[facility]-EMER[facility]ENCY DEPT Provider Note   CSN: [id number] Arrival date & time: 10/27/16  1248     History   Chief Complaint Chief Complaint  Patient presents with  . Fall    HPI Alexandria Lowe is a 61 y.o. female.  HPI Patient with early-onset dementia and history of seizures was found down by nursing home staff. Obvious hematoma to the left forehead. Patient is unable to contribute to history. She is nonverbal. Level V caveat. Per EMS, patient has history of dementia and is confused but normally ambulates without assistance.  Past Medical History:  Diagnosis Date  . Alzheimer's disease 06/17/2014  . Convulsions/seizures (HCC) 06/17/2014  . Depression   . Hyperlipidemia   . Hypertension   . Nonverbal    "for awhile now"/sister (06/01/2015)    Patient Active Problem List   Diagnosis Date Noted  . Malnutrition of moderate degree 10/29/2016  . AKI (acute kidney injury) (HCC)   . Fall at home, initial encounter 10/27/2016  . Acute lower UTI 10/27/2016  . Thrombocytopenia (HCC) 10/27/2016  . Mild renal insufficiency 10/27/2016  . UTI (urinary tract infection) 10/27/2016  . Altered mental state   . Fracture of right humerus 11/05/2015  . Anemia 11/05/2015  . Essential hypertension 11/05/2015  . Hyperlipidemia 11/05/2015  . "walking corpse" syndrome   . Seizures (HCC) 11/04/2015  . Contusion   . Depressed level of consciousness 06/01/2015  . Seizure (HCC)   . Alzheimer's disease 06/17/2014  . Convulsions/seizures (HCC) 06/17/2014  . Hypertensive urgency 03/10/2013  . Hypokalemia 03/10/2013  . Pre-syncope 03/10/2013  . Dementia     Past Surgical History:  Procedure Laterality Date  . DILATION AND CURETTAGE OF UTERUS    . VAGINAL HYSTERECTOMY      OB History    No data available       Home Medications    Prior to Admission medications   Medication Sig Start Date End Date Taking? Authorizing Provider  acetaminophen (TYLENOL) 500 MG tablet Take 500 mg by mouth every  4 (four) hours as needed for mild pain, moderate pain, fever or headache.    Yes [provider]  alum & mag hydroxide-simeth (MINTOX) 200-200-20 MG/5ML suspension Take 30 mLs by mouth as needed for indigestion or heartburn.    Yes [provider]  brimonidine (ALPHAGAN P) 0.1 % SOLN Place 1 drop into both eyes 2 (two) times daily.   Yes [provider]  brinzolamide (AZOPT) 1 % ophthalmic suspension Place 1 drop into both eyes 2 (two) times daily.   Yes [provider]  Calcium Carbonate-Vitamin D (CALCIUM-D) 600-400 MG-UNIT TABS Take 1 tablet by mouth daily with breakfast.    Yes [provider]  guaiFENesin (ROBITUSSIN) 100 MG/5ML liquid Take 200 mg by mouth every 6 (six) hours as needed for cough.   Yes [provider]  levETIRAcetam (KEPPRA) 100 MG/ML solution Take 750 mg by mouth 2 (two) times daily.   Yes [provider]  Lidocaine (ASPERCREME LIDOCAINE) 4 % PTCH Apply 1 patch topically every 12 (twelve) hours. Apply to right shoulder   Yes [provider]  lisinopril-hydrochlorothiazide (PRINZIDE,ZESTORETIC) 20-12.5 MG tablet Take 1 tablet by mouth daily with breakfast.    Yes [provider]  loperamide (IMODIUM) 2 MG capsule Take 2 mg by mouth as needed for diarrhea or loose stools.   Yes [provider]  loratadine (CLARITIN) 10 MG tablet Take 10 mg by mouth daily with breakfast.    Yes [provider]  LORazepam (ATIVAN) 0.5 MG tablet Take 0.5 mg by mouth every 8 (eight) hours as needed for anxiety.   Yes [provider]  magnesium hydroxide (MILK OF MAGNESIA) 400 MG/5ML suspension Take 30 mLs by mouth at bedtime as needed for mild constipation.   Yes [provider]  meloxicam (MOBIC) 15 MG tablet Take 15 mg by mouth daily.   Yes [provider]  mirtazapine (REMERON) 30 MG tablet Take 30 mg by mouth at bedtime.   Yes [provider]  mometasone (NASONEX)  50 MCG/ACT nasal spray Place 2 sprays into the nose daily after breakfast.    Yes [provider]  neomycin-bacitracin-polymyxin (NEOSPORIN) [phone] ointment Apply 1 application topically as needed (for wound care).    Yes [provider]  Nutritional Supplements (NUTRITIONAL SHAKE PO) Take 1 Can by mouth 3 (three) times daily. Mighty Shakes   Yes [provider]  sertraline (ZOLOFT) 100 MG tablet Take 100 mg by mouth daily.   Yes [provider]  Skin Protectants, Misc. (MINERIN) CREA Apply 1 application topically at bedtime. Applies to whole body   Yes [provider]  Travoprost, BAK Free, (TRAVATAN Z) 0.004 % SOLN ophthalmic solution Place 1 drop into both eyes at bedtime.   Yes [provider]  Valproate Sodium (VALPROIC ACID) 250 MG/5ML SOLN Take 12.5 mLs by mouth 2 (two) times daily.   Yes [provider]    Family History Family History  Problem Relation Age of Onset  . Cancer Mother        lung  . Hypertension Sister   . Thyroid disease Sister   . Hypertension Brother   . Diabetes Brother     Social History Social History  Substance Use Topics  . Smoking status: Never Smoker  . Smokeless tobacco: Never Used  . Alcohol use No     Allergies   Patient has no known allergies.   Review of Systems Review of Systems  Unable to perform ROS: Dementia     Physical Exam Updated Vital Signs BP (!) 141/68 (BP Location: Right Arm)   Pulse 64   Temp 98.9 F (37.2 C) (Oral)   Resp 16   Wt 56.7 kg (125 lb)   SpO2 100%   BMI 22.86 kg/m   Physical Exam  Constitutional: She is oriented to person, place, and time. She appears well-developed and well-nourished.  HENT:  Head: Normocephalic.  Mouth/Throat: Oropharynx is clear and moist.  Left forehead hematoma.  Eyes: EOM are normal. Pupils are equal, round, and reactive to light.  Neck: Normal range of motion. Neck supple.  Cardiovascular: Normal rate and  regular rhythm.   Pulmonary/Chest: Effort normal and breath sounds normal.  Abdominal: Soft. Bowel sounds are normal. There is no tenderness. There is no rebound and no guarding.  Musculoskeletal: Normal range of motion. She exhibits no edema or tenderness.  Neurological: She is alert and oriented to person, place, and time.  Skin: Skin is warm and dry. No rash noted. No erythema.  Psychiatric: She has a normal mood and affect. Her behavior is normal.  Nursing note and vitals reviewed.    ED Treatments / Results  Labs (all labs ordered are listed, but only abnormal results are displayed) Labs Reviewed  URINE CULTURE - Abnormal; Notable for the following:       Result Value   Culture >=100,000 COLONIES/mL ENTEROCOCCUS FAECALIS (*)    Organism ID, Bacteria ENTEROCOCCUS FAECALIS (*)  All other components within normal limits  CBC WITH DIFFERENTIAL/PLATELET - Abnormal; Notable for the following:    Hemoglobin 11.5 (*)    HCT 35.9 (*)    RDW 17.7 (*)    Platelets 104 (*)    All other components within normal limits  COMPREHENSIVE METABOLIC PANEL - Abnormal; Notable for the following:    BUN 26 (*)    Creatinine, Ser 1.33 (*)    Albumin 3.1 (*)    AST 43 (*)    GFR calc non Af Amer 42 (*)    GFR calc Af Amer 49 (*)    All other components within normal limits  URINALYSIS, ROUTINE W REFLEX MICROSCOPIC - Abnormal; Notable for the following:    APPearance HAZY (*)    Ketones, ur 5 (*)    Leukocytes, UA MODERATE (*)    Bacteria, UA MANY (*)    Squamous Epithelial / LPF 0-5 (*)    All other components within normal limits  CBC WITH DIFFERENTIAL/PLATELET - Abnormal; Notable for the following:    RBC 3.80 (*)    Hemoglobin 10.1 (*)    HCT 32.1 (*)    RDW 16.6 (*)    Platelets 127 (*)    All other components within normal limits  BASIC METABOLIC PANEL - Abnormal; Notable for the following:    Glucose, Bld 105 (*)    Calcium 8.6 (*)    All other components within normal limits    CBC - Abnormal; Notable for the following:    RBC 3.63 (*)    Hemoglobin 10.1 (*)    HCT 31.1 (*)    RDW 16.9 (*)    Platelets 113 (*)    All other components within normal limits  BASIC METABOLIC PANEL - Abnormal; Notable for the following:    Calcium 8.2 (*)    All other components within normal limits  MRSA PCR SCREENING  VALPROIC ACID LEVEL  HIV ANTIBODY (ROUTINE TESTING)  CK    EKG  EKG Interpretation  Date/Time:  Saturday October 27 2016 12:53:45 EDT Ventricular Rate:  62 PR Interval:    QRS Duration: 97 QT Interval:  444 QTC Calculation: 451 R Axis:   59 Text Interpretation:  Sinus rhythm Abnormal R-wave progression, early transition No acute changes No significant change since last tracing Confirmed by Derwood Kaplan [phone]) on 10/27/2016 5:12:07 PM       Radiology No results found.  Procedures Procedures (including critical care time)  Medications Ordered in ED Medications  Valproate Sodium (DEPAKENE) solution 625 mg (625 mg Oral Given 10/29/16 2300)  latanoprost (XALATAN) 0.005 % ophthalmic solution 1 drop (1 drop Both Eyes Given 10/29/16 2300)  alum & mag hydroxide-simeth (MAALOX/MYLANTA) 200-200-20 MG/5ML suspension 30 mL (not administered)  brinzolamide (AZOPT) 1 % ophthalmic suspension 1 drop (1 drop Both Eyes Given 10/29/16 2300)  fluticasone (FLONASE) 50 MCG/ACT nasal spray 2 spray (2 sprays Each Nare Given 10/29/16 1110)  brimonidine (ALPHAGAN) 0.15 % ophthalmic solution 1 drop (1 drop Both Eyes Given 10/29/16 2300)  enoxaparin (LOVENOX) injection 40 mg (40 mg Subcutaneous Given 10/29/16 2300)  0.9 %  sodium chloride infusion ( Intravenous New Bag/Given 10/27/16 2307)  acetaminophen (TYLENOL) tablet 650 mg (not administered)    Or  acetaminophen (TYLENOL) suppository 650 mg (not administered)  ondansetron (ZOFRAN) injection 4 mg (not administered)  lidocaine (LIDODERM) 5 % 1 patch (1 patch Transdermal Patch Applied 10/29/16 2200)  hydrocerin (EUCERIN) cream (  Topical Given 10/29/16 2300)  neomycin-bacitracin-polymyxin (NEOSPORIN)  ointment 1 application (not administered)  hydrALAZINE (APRESOLINE) injection 10 mg (not administered)  0.9 %  sodium chloride infusion ( Intravenous Rate/Dose Change 10/29/16 1004)  ampicillin (OMNIPEN) 1 g in sodium chloride 0.9 % 50 mL IVPB (0 g Intravenous Stopped 10/29/16 1808)  levETIRAcetam (KEPPRA) 100 MG/ML solution 750 mg (750 mg Oral Given 10/29/16 2300)  loratadine (CLARITIN) tablet 10 mg (10 mg Oral Given 10/29/16 1113)  sertraline (ZOLOFT) tablet 100 mg (100 mg Oral Given 10/29/16 1113)  lisinopril (PRINIVIL,ZESTRIL) tablet 20 mg ( Oral See Alternative 10/29/16 1112)    Or  hydrochlorothiazide (MICROZIDE) capsule 12.5 mg (12.5 mg Oral Given 10/29/16 1112)  calcium-vitamin D (OSCAL WITH D) 500-200 MG-UNIT per tablet 1 tablet (1 tablet Oral Given 10/29/16 1113)  feeding supplement (ENSURE ENLIVE) (ENSURE ENLIVE) liquid 237 mL (237 mLs Oral Given 10/29/16 1639)  cefTRIAXone (ROCEPHIN) 1 g in dextrose 5 % 50 mL IVPB (0 g Intravenous Stopped 10/27/16 1748)  sodium chloride 0.9 % bolus 1,000 mL (0 mLs Intravenous Stopped 10/27/16 1852)     Initial Impression / Assessment and Plan / ED Course  I have reviewed the triage vital signs and the nursing notes.  Pertinent labs & imaging results that were available during my care of the patient were reviewed by me and considered in my medical decision making (see chart for details).    Patient with evidence of urinary tract infection. Started on IV antibiotics. Signed out to oncoming emergency physician pending CT head but anticipate admission.    Final Clinical Impressions(s) / ED Diagnoses   Final diagnoses:  Contusion of scalp, initial encounter  Acute lower UTI    New Prescriptions Current Discharge Medication List       Loren Racer, MD 10/30/16 0003

## 2016-10-27 NOTE — ED Notes (Signed)
IV team at bedside 

## 2016-10-27 NOTE — ED Notes (Signed)
RN attempted 2x unsuccessfully for IV.

## 2016-10-27 NOTE — ED Notes (Signed)
On way to CT 

## 2016-10-27 NOTE — ED Triage Notes (Signed)
Pt. Coming from [facility] for unwitnessed fall. Pt. Hx of early onset dementia. Med record does indicate that patient is on blood thinners. Pt. Non-verbal at this time.

## 2016-10-27 NOTE — ED Notes (Signed)
Contacted CT about delay in reading CT scan. CT states scans will be read shortly.

## 2016-10-27 NOTE — ED Notes (Signed)
Unsuccessful attempt at giving report to 6E. 

## 2016-10-27 NOTE — H&P (Signed)
History and Physical    Alexandria Lowe ZOX:[id number] DOB: Oct 17, 1955 DOA: 10/27/2016  PCP: Ron Parker, MD   Patient coming from: Nursing home   Chief Complaint: Unwitnessed fall, gen weakness, lethargy   HPI: Alexandria Lowe is a 61 y.o. female with medical history significant for Alzheimer dementia, seizure disorder, hypertension, and chronic normocytic anemia, now presenting to the emergency department after an unwitnessed fall at her nursing facility. At her baseline, patient is reportedly communicative, but slow to respond and gives very simple answers only. She reportedly follows commands and ambulates on her own at baseline. She was discovered to have suffered an unwitnessed fall at the nursing facility today and was brought into the ED for evaluation of this. She was also noted to be more somnolent than usual, not responding verbally, and not following commands as she usually does. She appeared to be too weak to get up from the floor on her own, which is unusual for her. She had not been voicing any complaints leading up to this.   ED Course: Upon arrival to the ED, patient is found to be afebrile, saturating well on room air, and with vital signs stable. EKG features a sinus rhythm with early R transition. Noncontrast head CT is negative for acute intracranial abnormality and CT cervical spine is negative for fracture. Chemistry panels notable for a serum creatinine 1.33, up from an apparent baseline of less than 1. CBC is notable for a stable normocytic anemia with hemoglobin of 11.5, and a recurrent thrombocytopenia with platelets 104,000. Urinalysis is suggestive of infection and features hemoglobin on the dipstick. Urine was sent for culture, patient was given 1 L of normal saline, and treated with empiric Rocephin. She remained hemodynamically stable in the ED and has not been in any apparent respiratory distress. She'll be observed on the medical surgical unit for ongoing evaluation and  management of depressed level of consciousness with fall, likely secondary to UTI.  Review of Systems:  All other systems reviewed and apart from HPI, are negative.  Past Medical History:  Diagnosis Date  . Alzheimer's disease 06/17/2014  . Convulsions/seizures (HCC) 06/17/2014  . Depression   . Hyperlipidemia   . Hypertension   . Nonverbal    "for awhile now"/sister (06/01/2015)    Past Surgical History:  Procedure Laterality Date  . DILATION AND CURETTAGE OF UTERUS    . VAGINAL HYSTERECTOMY       reports that she has never smoked. She has never used smokeless tobacco. She reports that she does not drink alcohol or use drugs.  No Known Allergies  Family History  Problem Relation Age of Onset  . Cancer Mother        lung  . Hypertension Sister   . Thyroid disease Sister   . Hypertension Brother   . Diabetes Brother      Prior to Admission medications   Medication Sig Start Date End Date Taking? Authorizing Provider  acetaminophen (TYLENOL) 500 MG tablet Take 500 mg by mouth every 4 (four) hours as needed for mild pain, moderate pain, fever or headache.    Yes [provider]  alum & mag hydroxide-simeth (MINTOX) 200-200-20 MG/5ML suspension Take 30 mLs by mouth as needed for indigestion or heartburn.    Yes [provider]  brimonidine (ALPHAGAN P) 0.1 % SOLN Place 1 drop into both eyes 2 (two) times daily.   Yes [provider]  brinzolamide (AZOPT) 1 % ophthalmic suspension Place 1 drop into both  eyes 2 (two) times daily.   Yes [provider]  Calcium Carbonate-Vitamin D (CALCIUM-D) 600-400 MG-UNIT TABS Take 1 tablet by mouth daily with breakfast.    Yes [provider]  guaiFENesin (ROBITUSSIN) 100 MG/5ML liquid Take 200 mg by mouth every 6 (six) hours as needed for cough.   Yes [provider]  levETIRAcetam (KEPPRA) 100 MG/ML solution Take 750 mg by mouth 2 (two) times daily.   Yes [provider]    Lidocaine (ASPERCREME LIDOCAINE) 4 % PTCH Apply 1 patch topically every 12 (twelve) hours. Apply to right shoulder   Yes [provider]  lisinopril-hydrochlorothiazide (PRINZIDE,ZESTORETIC) 20-12.5 MG tablet Take 1 tablet by mouth daily with breakfast.    Yes [provider]  loperamide (IMODIUM) 2 MG capsule Take 2 mg by mouth as needed for diarrhea or loose stools.   Yes [provider]  loratadine (CLARITIN) 10 MG tablet Take 10 mg by mouth daily with breakfast.    Yes [provider]  LORazepam (ATIVAN) 0.5 MG tablet Take 0.5 mg by mouth every 8 (eight) hours as needed for anxiety.   Yes [provider]  magnesium hydroxide (MILK OF MAGNESIA) 400 MG/5ML suspension Take 30 mLs by mouth at bedtime as needed for mild constipation.   Yes [provider]  meloxicam (MOBIC) 15 MG tablet Take 15 mg by mouth daily.   Yes [provider]  mirtazapine (REMERON) 30 MG tablet Take 30 mg by mouth at bedtime.   Yes [provider]  mometasone (NASONEX) 50 MCG/ACT nasal spray Place 2 sprays into the nose daily after breakfast.    Yes [provider]  neomycin-bacitracin-polymyxin (NEOSPORIN) [phone] ointment Apply 1 application topically as needed (for wound care).    Yes [provider]  Nutritional Supplements (NUTRITIONAL SHAKE PO) Take 1 Can by mouth 3 (three) times daily. Mighty Shakes   Yes [provider]  sertraline (ZOLOFT) 100 MG tablet Take 100 mg by mouth daily.   Yes [provider]  Skin Protectants, Misc. (MINERIN) CREA Apply 1 application topically at bedtime. Applies to whole body   Yes [provider]  Travoprost, BAK Free, (TRAVATAN Z) 0.004 % SOLN ophthalmic solution Place 1 drop into both eyes at bedtime.   Yes [provider]  Valproate Sodium (VALPROIC ACID) 250 MG/5ML SOLN Take 12.5 mLs by mouth 2 (two) times daily.   Yes [provider]     Physical Exam: Vitals:   10/27/16 1845 10/27/16 1900 10/27/16 1915 10/27/16 1930  BP: 130/90 (!) 149/63 (!) 154/65 (!) 151/61  Pulse:  61 63 62  Resp: 14 14 13 17   Temp:      TempSrc:      SpO2:  100% 99% 100%  Weight:          Constitutional: No respiratory distress, calm, large hematoma right forehead. Eyes: PERTLA, lids and conjunctivae normal ENMT: Mucous membranes are moist. Posterior pharynx clear of any exudate or lesions.   Neck: normal, supple, no masses, no thyromegaly Respiratory: clear to auscultation bilaterally, no wheezing, no crackles. Normal respiratory effort.    Cardiovascular: S1 & S2 heard, regular rate and rhythm. No extremity edema. 2+ pedal pulses. No significant JVD. Abdomen: No distension, soft, suprapubic tenderness without rebound pain or guarding. Bowel sounds active.  Musculoskeletal: no clubbing / cyanosis. No joint deformity upper and lower extremities. Hematoma at right forehead.  Skin: no significant rashes, lesions, ulcers. Warm, dry, well-perfused. Neurologic: No gross facial asymmetry.  PERRL. Sensation intact, patellar DTR's normal. Strength 5/5 in all 4 limbs.  Psychiatric: No verbal response. Calm, makes eye-contact, follows commands.     Labs on Admission: I have personally reviewed following labs and imaging studies  CBC:  Recent Labs Lab 10/27/16 1400  WBC 7.6  NEUTROABS 5.2  HGB 11.5*  HCT 35.9*  MCV 85.9  PLT 104*   Basic Metabolic Panel:  Recent Labs Lab 10/27/16 1400  NA 142  K 4.1  CL 107  CO2 26  GLUCOSE 88  BUN 26*  CREATININE 1.33*  CALCIUM 9.1   GFR: Estimated Creatinine Clearance: 38.1 mL/min (A) (by C-G formula based on SCr of 1.33 mg/dL (H)). Liver Function Tests:  Recent Labs Lab 10/27/16 1400  AST 43*  ALT 34  ALKPHOS 45  BILITOT 0.6  PROT 6.6  ALBUMIN 3.1*   No results for input(s): LIPASE, AMYLASE in the last 168 hours. No results for input(s): AMMONIA in the last 168  hours. Coagulation Profile: No results for input(s): INR, PROTIME in the last 168 hours. Cardiac Enzymes: No results for input(s): CKTOTAL, CKMB, CKMBINDEX, TROPONINI in the last 168 hours. BNP (last 3 results) No results for input(s): PROBNP in the last 8760 hours. HbA1C: No results for input(s): HGBA1C in the last 72 hours. CBG: No results for input(s): GLUCAP in the last 168 hours. Lipid Profile: No results for input(s): CHOL, HDL, LDLCALC, TRIG, CHOLHDL, LDLDIRECT in the last 72 hours. Thyroid Function Tests: No results for input(s): TSH, T4TOTAL, FREET4, T3FREE, THYROIDAB in the last 72 hours. Anemia Panel: No results for input(s): VITAMINB12, FOLATE, FERRITIN, TIBC, IRON, RETICCTPCT in the last 72 hours. Urine analysis:    Component Value Date/Time   COLORURINE YELLOW 10/27/2016 1630   APPEARANCEUR HAZY (A) 10/27/2016 1630   LABSPEC 1.018 10/27/2016 1630   PHURINE 6.0 10/27/2016 1630   GLUCOSEU NEGATIVE 10/27/2016 1630   HGBUR NEGATIVE 10/27/2016 1630   BILIRUBINUR NEGATIVE 10/27/2016 1630   KETONESUR 5 (A) 10/27/2016 1630   PROTEINUR NEGATIVE 10/27/2016 1630   UROBILINOGEN 0.2 04/17/2014 1125   NITRITE NEGATIVE 10/27/2016 1630   LEUKOCYTESUR MODERATE (A) 10/27/2016 1630   Sepsis Labs: @LABRCNTIP (procalcitonin:4,lacticidven:4) )No results found for this or any previous visit (from the past 240 hour(s)).   Radiological Exams on Admission: Ct Head Wo Contrast  Result Date: 10/27/2016 CLINICAL DATA:  Unwitnessed fall. EXAM: CT HEAD WITHOUT CONTRAST CT CERVICAL SPINE WITHOUT CONTRAST TECHNIQUE: Multidetector CT imaging of the head and cervical spine was performed following the standard protocol without intravenous contrast. Multiplanar CT image reconstructions of the cervical spine were also generated. COMPARISON:  08/10/2016 FINDINGS: CT HEAD FINDINGS Brain: There is no evidence for acute hemorrhage, hydrocephalus, mass lesion, or abnormal extra-axial fluid collection. No  definite CT evidence for acute infarction. Diffuse loss of parenchymal volume is consistent with atrophy. Patchy low attenuation in the deep hemispheric and periventricular white matter is nonspecific, but likely reflects chronic microvascular ischemic demyelination. Vascular: No hyperdense vessel or unexpected calcification. Skull: No evidence for fracture. No worrisome lytic or sclerotic lesion. Sinuses/Orbits: The visualized paranasal sinuses and mastoid air cells are clear. Visualized portions of the globes and intraorbital fat are unremarkable. Other: Left frontotemporal scalp contusion noted. CT CERVICAL SPINE FINDINGS Alignment: No subluxation. Reversal of normal cervical lordosis is evident. Skull base and vertebrae: No acute fracture. No primary bone lesion or focal pathologic process. Soft tissues and spinal canal: No prevertebral fluid or swelling. No visible canal hematoma. Disc levels:  Maintained. Upper chest: Negative.  Other: None. IMPRESSION: 1. No acute intracranial abnormality. Atrophy with chronic small vessel white matter ischemic disease. 2. No acute cervical spine fracture. 3. Loss of cervical lordosis. This can be related to patient positioning, muscle spasm or soft tissue injury. Electronically Signed   By: Kennith CenterEric  Mansell M.D.   On: 10/27/2016 17:20   Ct Cervical Spine Wo Contrast  Result Date: 10/27/2016 CLINICAL DATA:  Unwitnessed fall. EXAM: CT HEAD WITHOUT CONTRAST CT CERVICAL SPINE WITHOUT CONTRAST TECHNIQUE: Multidetector CT imaging of the head and cervical spine was performed following the standard protocol without intravenous contrast. Multiplanar CT image reconstructions of the cervical spine were also generated. COMPARISON:  08/10/2016 FINDINGS: CT HEAD FINDINGS Brain: There is no evidence for acute hemorrhage, hydrocephalus, mass lesion, or abnormal extra-axial fluid collection. No definite CT evidence for acute infarction. Diffuse loss of parenchymal volume is consistent with  atrophy. Patchy low attenuation in the deep hemispheric and periventricular white matter is nonspecific, but likely reflects chronic microvascular ischemic demyelination. Vascular: No hyperdense vessel or unexpected calcification. Skull: No evidence for fracture. No worrisome lytic or sclerotic lesion. Sinuses/Orbits: The visualized paranasal sinuses and mastoid air cells are clear. Visualized portions of the globes and intraorbital fat are unremarkable. Other: Left frontotemporal scalp contusion noted. CT CERVICAL SPINE FINDINGS Alignment: No subluxation. Reversal of normal cervical lordosis is evident. Skull base and vertebrae: No acute fracture. No primary bone lesion or focal pathologic process. Soft tissues and spinal canal: No prevertebral fluid or swelling. No visible canal hematoma. Disc levels:  Maintained. Upper chest: Negative. Other: None. IMPRESSION: 1. No acute intracranial abnormality. Atrophy with chronic small vessel white matter ischemic disease. 2. No acute cervical spine fracture. 3. Loss of cervical lordosis. This can be related to patient positioning, muscle spasm or soft tissue injury. Electronically Signed   By: Kennith CenterEric  Mansell M.D.   On: 10/27/2016 17:20    EKG: Independently reviewed. Sinus rhythm, early R-transition.   Assessment/Plan  1. Acute UTI  - Pt presents from nursing facility with lethargy and decrease in verbal responses after unwitnessed fall - Head CT neg for acute intracranial abnormality, but she is noted to have UTI  - Urine was sent for culture, 1 liter of NS was given, and she was started on empiric Rocephin in ED  - Plan to continue empiric Rocephin pending culture data    2. Acute kidney injury  - SCr is 1.33 on admission, up from apparent baseline of <1  - Likely prerenal in setting of acute infectious process  - Anticipate improvement with IVF and avoidance of nephrotoxins  - She was given 1 liter NS in ED and is continued on NS infusion  - Hold Mobic  and lisinopril-HCTZ until renal fxn stabilizes  - Repeat chem panel in am   3. Fall  - Likely secondary to the UTI; at baseline, she is able to ambulate unassisted  - CT head is neg for acute intracranial abnormality  - She suffered a hematoma at right forehead  - Continue prn analgesia  4. Hypertension  - BP at goal  - Hold lisinopril-HCTZ until renal fxn stabilizes  - Treat with prn hydralazine IVP's for now    5. Thrombocytopenia  - Platelets 104k on admission - Chronic intermittent thrombocytopenia noted - There is no bleeding evident  - Possibly secondary to the antiepileptics - Monitor with periodic CBC    DVT prophylaxis: sq Lovenox Code Status: Full  Family Communication: Discussed with patient Disposition Plan: Observe on med-surg Consults  called: None Admission status: Observation     Briscoe Deutscher, MD [facility] Pager [phone]  If 7PM-7AM, please contact night-coverage [email or web address] Password [facility]  10/27/2016, 7:56 PM

## 2016-10-27 NOTE — ED Provider Notes (Signed)
  Physical Exam  BP 127/68   Pulse 61   Temp 97.8 F (36.6 C) (Axillary)   Resp 12   Wt 60.8 kg (134 lb)   SpO2 100%   BMI 24.51 kg/m   Physical Exam  ED Course  Procedures  MDM  [facility]   Patient in the ED for weakness, fall. Pt has hx of dementia, but normally able to walk and talkative. Workup thus far shows UA with +++WBC and mild thrombocytopenia. Exam showed hematoma.  Concerning findings are as following: none Important pending results are CT imaging  According to Dr. Kristopher GleeYelvorton, plan is to admit to medicine.   Patient had no complains, no concerns from the nursing side. Will continue to monitor.        Derwood KaplanNanavati, Dhiya Smits, MD 10/27/16 1800

## 2016-10-28 ENCOUNTER — Encounter (HOSPITAL_COMMUNITY): Payer: Self-pay | Admitting: General Practice

## 2016-10-28 DIAGNOSIS — I10 Essential (primary) hypertension: Secondary | ICD-10-CM

## 2016-10-28 DIAGNOSIS — F329 Major depressive disorder, single episode, unspecified: Secondary | ICD-10-CM | POA: Diagnosis present

## 2016-10-28 DIAGNOSIS — D696 Thrombocytopenia, unspecified: Secondary | ICD-10-CM | POA: Diagnosis present

## 2016-10-28 DIAGNOSIS — G92 Toxic encephalopathy: Secondary | ICD-10-CM | POA: Diagnosis present

## 2016-10-28 DIAGNOSIS — N183 Chronic kidney disease, stage 3 (moderate): Secondary | ICD-10-CM | POA: Diagnosis present

## 2016-10-28 DIAGNOSIS — Z6822 Body mass index (BMI) 22.0-22.9, adult: Secondary | ICD-10-CM | POA: Diagnosis not present

## 2016-10-28 DIAGNOSIS — G300 Alzheimer's disease with early onset: Secondary | ICD-10-CM

## 2016-10-28 DIAGNOSIS — F0280 Dementia in other diseases classified elsewhere without behavioral disturbance: Secondary | ICD-10-CM

## 2016-10-28 DIAGNOSIS — G309 Alzheimer's disease, unspecified: Secondary | ICD-10-CM | POA: Diagnosis present

## 2016-10-28 DIAGNOSIS — N3000 Acute cystitis without hematuria: Secondary | ICD-10-CM | POA: Diagnosis not present

## 2016-10-28 DIAGNOSIS — E440 Moderate protein-calorie malnutrition: Secondary | ICD-10-CM | POA: Diagnosis present

## 2016-10-28 DIAGNOSIS — F0391 Unspecified dementia with behavioral disturbance: Secondary | ICD-10-CM | POA: Diagnosis not present

## 2016-10-28 DIAGNOSIS — Y92129 Unspecified place in nursing home as the place of occurrence of the external cause: Secondary | ICD-10-CM | POA: Diagnosis not present

## 2016-10-28 DIAGNOSIS — R404 Transient alteration of awareness: Secondary | ICD-10-CM | POA: Diagnosis not present

## 2016-10-28 DIAGNOSIS — W19XXXA Unspecified fall, initial encounter: Secondary | ICD-10-CM | POA: Diagnosis present

## 2016-10-28 DIAGNOSIS — N390 Urinary tract infection, site not specified: Secondary | ICD-10-CM | POA: Diagnosis not present

## 2016-10-28 DIAGNOSIS — N179 Acute kidney failure, unspecified: Secondary | ICD-10-CM | POA: Diagnosis not present

## 2016-10-28 DIAGNOSIS — R569 Unspecified convulsions: Secondary | ICD-10-CM | POA: Diagnosis not present

## 2016-10-28 DIAGNOSIS — I129 Hypertensive chronic kidney disease with stage 1 through stage 4 chronic kidney disease, or unspecified chronic kidney disease: Secondary | ICD-10-CM | POA: Diagnosis present

## 2016-10-28 DIAGNOSIS — Z79899 Other long term (current) drug therapy: Secondary | ICD-10-CM | POA: Diagnosis not present

## 2016-10-28 DIAGNOSIS — S0003XA Contusion of scalp, initial encounter: Secondary | ICD-10-CM | POA: Diagnosis not present

## 2016-10-28 LAB — CBC WITH DIFFERENTIAL/PLATELET
BASOS ABS: 0 10*3/uL (ref 0.0–0.1)
BASOS PCT: 0 %
Eosinophils Absolute: 0 10*3/uL (ref 0.0–0.7)
Eosinophils Relative: 0 %
HEMATOCRIT: 32.1 % — AB (ref 36.0–46.0)
HEMOGLOBIN: 10.1 g/dL — AB (ref 12.0–15.0)
LYMPHS PCT: 19 %
Lymphs Abs: 1.4 10*3/uL (ref 0.7–4.0)
MCH: 26.6 pg (ref 26.0–34.0)
MCHC: 31.5 g/dL (ref 30.0–36.0)
MCV: 84.5 fL (ref 78.0–100.0)
Monocytes Absolute: 0.7 10*3/uL (ref 0.1–1.0)
Monocytes Relative: 9 %
NEUTROS ABS: 5.5 10*3/uL (ref 1.7–7.7)
NEUTROS PCT: 72 %
Platelets: 127 10*3/uL — ABNORMAL LOW (ref 150–400)
RBC: 3.8 MIL/uL — ABNORMAL LOW (ref 3.87–5.11)
RDW: 16.6 % — ABNORMAL HIGH (ref 11.5–15.5)
WBC: 7.5 10*3/uL (ref 4.0–10.5)

## 2016-10-28 LAB — BASIC METABOLIC PANEL
ANION GAP: 6 (ref 5–15)
BUN: 15 mg/dL (ref 6–20)
CHLORIDE: 109 mmol/L (ref 101–111)
CO2: 25 mmol/L (ref 22–32)
Calcium: 8.6 mg/dL — ABNORMAL LOW (ref 8.9–10.3)
Creatinine, Ser: 0.96 mg/dL (ref 0.44–1.00)
GFR calc Af Amer: >60 mL/min (ref >60)
GFR calc non Af Amer: >60 mL/min (ref >60)
Glucose, Bld: 105 mg/dL — ABNORMAL HIGH (ref 65–99)
Potassium: 3.7 mmol/L (ref 3.5–5.1)
Sodium: 140 mmol/L (ref 135–145)

## 2016-10-28 LAB — HIV ANTIBODY (ROUTINE TESTING W REFLEX): HIV Screen 4th Generation wRfx: NONREACTIVE

## 2016-10-28 LAB — MRSA PCR SCREENING: MRSA by PCR: NEGATIVE

## 2016-10-28 MED ORDER — SODIUM CHLORIDE 0.9 % IV SOLN
750.0000 mg | Freq: Two times a day (BID) | INTRAVENOUS | Status: DC
  Administered 2016-10-28 (×2): 750 mg via INTRAVENOUS
  Filled 2016-10-28 (×3): qty 7.5

## 2016-10-28 MED ORDER — DEXTROSE 5 % IV SOLN
1.0000 g | INTRAVENOUS | Status: DC
  Administered 2016-10-28: 1 g via INTRAVENOUS
  Filled 2016-10-28: qty 10

## 2016-10-28 MED ORDER — SODIUM CHLORIDE 0.9 % IV SOLN
INTRAVENOUS | Status: DC
  Administered 2016-10-28 – 2016-10-29 (×3): via INTRAVENOUS

## 2016-10-28 NOTE — Progress Notes (Signed)
Progress Note    Alexandria Lowe  ZOX:[id number] DOB: 1956-02-26  DOA: 10/27/2016 PCP: Ron Parker, MD    Brief Narrative:   Chief complaint: Follow-up generalized weakness/fall  Medical records reviewed and are as summarized below:  Alexandria Lowe is an 61 y.o. female the PMH of Alzheimer's dementia, seizure disorder, hypertension and chronic normocytic anemia who was admitted 10/27/16 from her SNF after suffering from an unwitnessed fall. At baseline, the patient is minimally communicative and ambulates. Nursing staff reported that she was more somnolent than usual and not able to follow commands. CT of the head and cervical spine were unremarkable. Noted to have a mild increase in her creatinine and evidence of possible UTI on urinalysis.  Assessment/Plan:   Principal Problem:   Acute lower UTI complicated by acute encephalopathy/depressed level of consciousness CT of the head was negative for intracranial abnormality other than significant atrophy, encephalopathy likely from UTI. Urine culture sent. Given a 1 L normal saline bolus and started on empiric Rocephin. Follow-up culture data. Given ongoing unresponsiveness and h/o seizures, check EEG, ? Seizure/ post ictal status.  Active Problems:   Dementia/Alzheimer's disease Supportive care. Monitor for ongoing mental status changes.    Convulsions/seizures (HCC) Continue Keppra and valproic acid. Check EEG.    Essential hypertension Lisinopril/HCTZ held given acute kidney injury.    Fall No fractures identified on radiographic evaluation done on admission.    Thrombocytopenia (HCC) Mild. May be an acute phase reaction, as platelet count was normal in March of this year.    Stage II-III CKD GFR has ranged from 42-greater than 60, and creatinine is consistent with stage II-III chronic kidney disease, and has improved with IV fluids.    HIV screening The patient falls between the ages of 13-64 and should be screened for HIV,  therefore HIV testing ordered.    Underweight Body mass index is 21.88 kg/m.   Family Communication/Anticipated D/C date and plan/Code Status   DVT prophylaxis: Lovenox ordered. Code Status: Full Code.  Family Communication: No family at the bedside. Disposition Plan: SNF when stable.   Medical Consultants:    None.   Procedures:    None  Anti-Infectives:    Rocephin 10/27/16--->  Subjective:   Unresponsive.  RN unable to give her meds orally.  Objective:    Vitals:   10/27/16 1915 10/27/16 1930 10/27/16 2105 10/28/16 0417  BP: (!) 154/65 (!) 151/61 (!) 150/60 (!) 107/55  Pulse: 63 62 79 68  Resp: 13 17 17 16   Temp:   98.2 F (36.8 C) 97.9 F (36.6 C)  TempSrc:   Oral Oral  SpO2: 99% 100% 100% 95%  Weight:   54.3 kg (119 lb 9.6 oz)     Intake/Output Summary (Last 24 hours) at 10/28/16 0819 Last data filed at 10/28/16 0200  Gross per 24 hour  Intake             1050 ml  Output                0 ml  Net             1050 ml   Filed Weights   10/27/16 1252 10/27/16 2105  Weight: 60.8 kg (134 lb) 54.3 kg (119 lb 9.6 oz)    Exam: General exam: Appears calm and comfortable. Non-responsive other than to sternal rub. Respiratory system: Clear to auscultation. Respiratory effort normal. Cardiovascular system: S1 & S2 heard, RRR. No JVD,  rubs, gallops  or clicks. No murmurs. Gastrointestinal system: Abdomen is nondistended, soft and nontender. No organomegaly or masses felt. Normal bowel sounds heard. Central nervous system: Unresponsive except to sternal rub. Extremities: No clubbing,  or cyanosis. No edema. Skin: Ecchymosis left forehead. Psychiatry: Non-responsive.   Data Reviewed:   I have personally reviewed following labs and imaging studies:  Labs: Basic Metabolic Panel:  Recent Labs Lab 10/27/16 1400 10/28/16 0617  NA 142 140  K 4.1 3.7  CL 107 109  CO2 26 25  GLUCOSE 88 105*  BUN 26* 15  CREATININE 1.33* 0.96  CALCIUM 9.1 8.6*    GFR Estimated Creatinine Clearance: 48.7 mL/min (by C-G formula based on SCr of 0.96 mg/dL). Liver Function Tests:  Recent Labs Lab 10/27/16 1400  AST 43*  ALT 34  ALKPHOS 45  BILITOT 0.6  PROT 6.6  ALBUMIN 3.1*   CBC:  Recent Labs Lab 10/27/16 1400 10/28/16 0617  WBC 7.6 7.5  NEUTROABS 5.2 5.5  HGB 11.5* 10.1*  HCT 35.9* 32.1*  MCV 85.9 84.5  PLT 104* 127*   Cardiac Enzymes:  Recent Labs Lab 10/27/16 1400  CKTOTAL 131   Microbiology Recent Results (from the past 240 hour(s))  MRSA PCR Screening     Status: None   Collection Time: 10/28/16  6:37 AM  Result Value Ref Range Status   MRSA by PCR NEGATIVE NEGATIVE Final    Comment:        The GeneXpert MRSA Assay (FDA approved for NASAL specimens only), is one component of a comprehensive MRSA colonization surveillance program. It is not intended to diagnose MRSA infection nor to guide or monitor treatment for MRSA infections.     Radiology: Ct Head Wo Contrast  Result Date: 10/27/2016 CLINICAL DATA:  Unwitnessed fall. EXAM: CT HEAD WITHOUT CONTRAST CT CERVICAL SPINE WITHOUT CONTRAST TECHNIQUE: Multidetector CT imaging of the head and cervical spine was performed following the standard protocol without intravenous contrast. Multiplanar CT image reconstructions of the cervical spine were also generated. COMPARISON:  08/10/2016 FINDINGS: CT HEAD FINDINGS Brain: There is no evidence for acute hemorrhage, hydrocephalus, mass lesion, or abnormal extra-axial fluid collection. No definite CT evidence for acute infarction. Diffuse loss of parenchymal volume is consistent with atrophy. Patchy low attenuation in the deep hemispheric and periventricular white matter is nonspecific, but likely reflects chronic microvascular ischemic demyelination. Vascular: No hyperdense vessel or unexpected calcification. Skull: No evidence for fracture. No worrisome lytic or sclerotic lesion. Sinuses/Orbits: The visualized paranasal  sinuses and mastoid air cells are clear. Visualized portions of the globes and intraorbital fat are unremarkable. Other: Left frontotemporal scalp contusion noted. CT CERVICAL SPINE FINDINGS Alignment: No subluxation. Reversal of normal cervical lordosis is evident. Skull base and vertebrae: No acute fracture. No primary bone lesion or focal pathologic process. Soft tissues and spinal canal: No prevertebral fluid or swelling. No visible canal hematoma. Disc levels:  Maintained. Upper chest: Negative. Other: None. IMPRESSION: 1. No acute intracranial abnormality. Atrophy with chronic small vessel white matter ischemic disease. 2. No acute cervical spine fracture. 3. Loss of cervical lordosis. This can be related to patient positioning, muscle spasm or soft tissue injury. Electronically Signed   By: Kennith Center M.D.   On: 10/27/2016 17:20   Ct Cervical Spine Wo Contrast  Result Date: 10/27/2016 CLINICAL DATA:  Unwitnessed fall. EXAM: CT HEAD WITHOUT CONTRAST CT CERVICAL SPINE WITHOUT CONTRAST TECHNIQUE: Multidetector CT imaging of the head and cervical spine was performed following the standard protocol without intravenous contrast. Multiplanar  CT image reconstructions of the cervical spine were also generated. COMPARISON:  08/10/2016 FINDINGS: CT HEAD FINDINGS Brain: There is no evidence for acute hemorrhage, hydrocephalus, mass lesion, or abnormal extra-axial fluid collection. No definite CT evidence for acute infarction. Diffuse loss of parenchymal volume is consistent with atrophy. Patchy low attenuation in the deep hemispheric and periventricular white matter is nonspecific, but likely reflects chronic microvascular ischemic demyelination. Vascular: No hyperdense vessel or unexpected calcification. Skull: No evidence for fracture. No worrisome lytic or sclerotic lesion. Sinuses/Orbits: The visualized paranasal sinuses and mastoid air cells are clear. Visualized portions of the globes and intraorbital fat are  unremarkable. Other: Left frontotemporal scalp contusion noted. CT CERVICAL SPINE FINDINGS Alignment: No subluxation. Reversal of normal cervical lordosis is evident. Skull base and vertebrae: No acute fracture. No primary bone lesion or focal pathologic process. Soft tissues and spinal canal: No prevertebral fluid or swelling. No visible canal hematoma. Disc levels:  Maintained. Upper chest: Negative. Other: None. IMPRESSION: 1. No acute intracranial abnormality. Atrophy with chronic small vessel white matter ischemic disease. 2. No acute cervical spine fracture. 3. Loss of cervical lordosis. This can be related to patient positioning, muscle spasm or soft tissue injury. Electronically Signed   By: Kennith Center M.D.   On: 10/27/2016 17:20    Medications:   . brimonidine  1 drop Both Eyes BID  . brinzolamide  1 drop Both Eyes BID  . calcium-vitamin D  1 tablet Oral Q breakfast  . enoxaparin (LOVENOX) injection  40 mg Subcutaneous Q24H  . feeding supplement (ENSURE ENLIVE)  237 mL Oral TID BM  . fluticasone  2 spray Each Nare Daily  . hydrocerin   Topical QHS  . latanoprost  1 drop Both Eyes QHS  . levETIRAcetam  750 mg Oral BID  . lidocaine  1 patch Transdermal Q24H  . loratadine  10 mg Oral Q breakfast  . mirtazapine  30 mg Oral QHS  . sertraline  100 mg Oral Daily  . Valproate Sodium  625 mg Oral BID   Continuous Infusions: . cefTRIAXone (ROCEPHIN)  IV      Medical decision making is of high complexity and this patient is at high risk of deterioration, therefore this is a level 3 visit.  (> 4 problem points, >4 data points, high risk: Need 2 out of 3)   Problems/DDx Points   Self limited or minor (max 2)       1   Established problem, stable (HTN, CKD)       1  2  Established problem, worsening       2   New problem, no additional W/U planned (max 1)       3   New problem, additional W/U planned        4  4   Data Reviewed Points   Review/order clinical lab tests       1  1    Review/order x-rays       1   Review/order tests (Echo, EKG, PFTs, etc)       1   Discussion of test results w/ performing MD       1   Independent review of image, tracing or specimen       2  2  Decision to obtain old records       1   Review and summation of old records       2  2   Level of risk Presenting prob  Diagnostics Management   Minimal 1 self limited/minor Labs CXR EKG/EEG U/A U/S Rest Gargles Bandages Dressings   Low 2 or more self limited/minor 1 stable chronic Acute uncomplicated illness Tests (PFTS) Non-CV imaging Arterial labs Biopsies of skin OTC drugs Minor surgery-no risk PT OT IVF without additives    Moderate 1 or more chronic illnesses w/ mild exac, progression or S/E from tx 2 or more stable chronic illnesses Undiagnosed new problem w/ uncertain prognosis Acute complicated injury  Stress tests Endoscopies with no risk factors Deep needle or incisional bx CV imaging without risk LP Thoracentesis Paracentesis Minor surgery w/ risks Elective major surgery w/ no risk (open, percutaneous or endoscopic) Prescription drugs Therapeutic nucl med IVF with additives Closed tx of fracture/dislocation    High Severe exac of chronic illness Acute or chronic illness/injury may pose a threat to life or bodily function (ARF) Change in neuro status    CV imaging w/ contrast and risk Cardio electophysiologic tests Endoscopies w/ risk Discography Elective major surgery Emergency major surgery Parenteral controlled substances Drug therapy req monitoring for toxicity DNR/de-escalation of care    MDM Prob points Data points Risk   Straightforward    <1    <1    Min   Low complexity    2    2    Low   Moderate    3    3    Mod   High Complexity    4 or more    4 or more    High  x     LOS: 0 days   Marvella Jenning  [facility] Pager [phone]. If unable to reach me by pager, please call my cell phone at [phone].  *Please refer to  [email or web address], password [email or web address] to get updated schedule on who will round on this patient, as hospitalists switch teams weekly. If 7PM-7AM, please contact night-coverage at [email or web address], password [email or web address] for any overnight needs.  10/28/2016, 8:19 AM

## 2016-10-28 NOTE — Progress Notes (Signed)
Patient only responding to painful stimuli this morning during assessment. Patient family arrived and patient responded to her sisters voice and tried to open eyes, cannot open left eye possibly due to swelling. Patient remains nonverbal and lethargic

## 2016-10-29 ENCOUNTER — Inpatient Hospital Stay (HOSPITAL_COMMUNITY): Payer: Medicare HMO

## 2016-10-29 DIAGNOSIS — E440 Moderate protein-calorie malnutrition: Secondary | ICD-10-CM

## 2016-10-29 DIAGNOSIS — S0003XA Contusion of scalp, initial encounter: Secondary | ICD-10-CM

## 2016-10-29 DIAGNOSIS — R404 Transient alteration of awareness: Secondary | ICD-10-CM

## 2016-10-29 DIAGNOSIS — F0391 Unspecified dementia with behavioral disturbance: Secondary | ICD-10-CM

## 2016-10-29 HISTORY — DX: Moderate protein-calorie malnutrition: E44.0

## 2016-10-29 LAB — URINE CULTURE: Culture: >=100000 — AB

## 2016-10-29 LAB — CBC
HEMATOCRIT: 31.1 % — AB (ref 36.0–46.0)
HEMOGLOBIN: 10.1 g/dL — AB (ref 12.0–15.0)
MCH: 27.8 pg (ref 26.0–34.0)
MCHC: 32.5 g/dL (ref 30.0–36.0)
MCV: 85.7 fL (ref 78.0–100.0)
Platelets: 113 10*3/uL — ABNORMAL LOW (ref 150–400)
RBC: 3.63 MIL/uL — AB (ref 3.87–5.11)
RDW: 16.9 % — ABNORMAL HIGH (ref 11.5–15.5)
WBC: 6.5 10*3/uL (ref 4.0–10.5)

## 2016-10-29 LAB — BASIC METABOLIC PANEL
Anion gap: 6 (ref 5–15)
BUN: 9 mg/dL (ref 6–20)
CALCIUM: 8.2 mg/dL — AB (ref 8.9–10.3)
CO2: 22 mmol/L (ref 22–32)
CREATININE: 0.82 mg/dL (ref 0.44–1.00)
Chloride: 110 mmol/L (ref 101–111)
GFR calc non Af Amer: >60 mL/min (ref >60)
GLUCOSE: 86 mg/dL (ref 65–99)
Potassium: 3.6 mmol/L (ref 3.5–5.1)
Sodium: 138 mmol/L (ref 135–145)

## 2016-10-29 MED ORDER — SODIUM CHLORIDE 0.9 % IV SOLN
1.0000 g | Freq: Four times a day (QID) | INTRAVENOUS | Status: DC
  Administered 2016-10-29 – 2016-10-31 (×9): 1 g via INTRAVENOUS
  Filled 2016-10-29 (×10): qty 1000

## 2016-10-29 MED ORDER — HYDROCHLOROTHIAZIDE 12.5 MG PO CAPS
12.5000 mg | ORAL_CAPSULE | Freq: Every day | ORAL | Status: DC
  Administered 2016-10-29 – 2016-11-01 (×3): 12.5 mg via ORAL
  Filled 2016-10-29 (×4): qty 1

## 2016-10-29 MED ORDER — CALCIUM CARBONATE-VITAMIN D 500-200 MG-UNIT PO TABS: 1.0000 | ORAL_TABLET | Freq: Every day | ORAL | Status: DC

## 2016-10-29 MED ORDER — LEVETIRACETAM 100 MG/ML PO SOLN
750.0000 mg | Freq: Two times a day (BID) | ORAL | Status: DC
  Administered 2016-10-29 – 2016-11-01 (×7): 750 mg via ORAL
  Filled 2016-10-29 (×8): qty 7.5

## 2016-10-29 MED ORDER — LORATADINE 10 MG PO TABS
10.0000 mg | ORAL_TABLET | Freq: Every day | ORAL | Status: DC
  Administered 2016-10-29 – 2016-11-01 (×4): 10 mg via ORAL
  Filled 2016-10-29 (×4): qty 1

## 2016-10-29 MED ORDER — ENSURE ENLIVE PO LIQD
237.0000 mL | Freq: Two times a day (BID) | ORAL | Status: DC
  Administered 2016-10-29 – 2016-11-01 (×6): 237 mL via ORAL

## 2016-10-29 MED ORDER — SERTRALINE HCL 100 MG PO TABS
100.0000 mg | ORAL_TABLET | Freq: Every day | ORAL | Status: DC
Start: 2016-10-29 — End: 2016-11-01
  Administered 2016-10-29 – 2016-11-01 (×4): 100 mg via ORAL
  Filled 2016-10-29 (×4): qty 1

## 2016-10-29 MED ORDER — LISINOPRIL-HYDROCHLOROTHIAZIDE 20-12.5 MG PO TABS
1.0000 | ORAL_TABLET | Freq: Every day | ORAL | Status: DC
Start: 2016-10-30 — End: 2016-10-29

## 2016-10-29 MED ORDER — LISINOPRIL 20 MG PO TABS
20.0000 mg | ORAL_TABLET | Freq: Every day | ORAL | Status: DC
  Administered 2016-10-31: 20 mg via ORAL
  Filled 2016-10-29 (×4): qty 1

## 2016-10-29 MED ORDER — CALCIUM CARBONATE-VITAMIN D 500-200 MG-UNIT PO TABS
1.0000 | ORAL_TABLET | Freq: Every day | ORAL | Status: DC
  Administered 2016-10-29 – 2016-11-01 (×4): 1 via ORAL
  Filled 2016-10-29 (×4): qty 1

## 2016-10-29 NOTE — Progress Notes (Addendum)
Initial Nutrition Assessment  DOCUMENTATION CODES:   Non-severe (moderate) malnutrition in context of chronic illness  INTERVENTION:   Ensure Enlive po BID, each supplement provides 350 kcal and 20 grams of protein  Magic cup TID with meals, each supplement provides 290 kcal and 9 grams of protein  NUTRITION DIAGNOSIS:   Malnutrition (moderate) related to chronic illness, alzheimer's as evidenced by moderate depletion of body fat, moderate depletions of muscle mass, 26 percent weight loss over one year and a 14% weight loss over 2 months.  GOAL:   Patient will meet greater than or equal to 90% of their needs  MONITOR:   PO intake, Supplement acceptance, Weight trends, Labs  REASON FOR ASSESSMENT:   Malnutrition Screening Tool    ASSESSMENT:    61 y.o. female the PMH of Alzheimer's dementia, seizure disorder, hypertension and chronic normocytic anemia who was admitted 10/27/16 from her SNF after suffering from an unwitnessed fall. At baseline, the patient is minimally communicative and ambulates. Nursing staff reported that she was more somnolent than usual and not able to follow commands. CT of the head and cervical spine were unremarkable. Noted to have a mild increase in her creatinine and evidence of possible UTI on urinalysis.   Unable to communicate with pt. Per chart, pt is eating <25% meals. Pt had a tray on her side table which was <25% eaten. Per chart, pt has lost 39lbs(26%) in one year and 18lbs(14%) in 2 months; this is severe. RD will order supplements; encourage intakes of meals and supplements.   Medications reviewed and include: lovenox, ampicillin  Labs reviewed: Ca 8.2(L)  Nutrition-Focused physical exam completed. Findings are moderate fat depletion in chest, moderate muscle depletion in BLE, and no edema.   Diet Order:  Diet regular Room service appropriate? Yes; Fluid consistency: Thin  Skin:  Reviewed, no issues  Last BM:  none since admit  Height:    Ht Readings from Last 1 Encounters:  08/25/16 5\' 2"  (1.575 m)    Weight:   Wt Readings from Last 1 Encounters:  10/28/16 116 lb (52.6 kg)    Ideal Body Weight:  50 kg  BMI:  Body mass index is 21.22 kg/m.  Estimated Nutritional Needs:   Kcal:  1600-1800kcal/day   Protein:  58-68g/day   Fluid:  >1.6L/day   EDUCATION NEEDS:   No education needs identified at this time  Betsey Holidayasey Estanislao Harmon MS, RD, LDN Pager #- [phone]

## 2016-10-29 NOTE — Evaluation (Signed)
Physical Therapy Evaluation Patient Details Name: Alexandria Lowe MRN: [id number] DOB: 02/13/1956 Today's Date: 10/29/2016   History of Present Illness  Pt is 61 y/o female admitted secondary to unwitnessed fall and decreased responsivity. Upon admission, pt found to have acute UTI. CT of head and spine negative for acute abnormality. PMH includes alzheimer's dementia, HTN, thrombocytopenia, seizure disorder, and history of falls.   Clinical Impression  Pt admitted for problem above with deficits below. Per RN, pt is non-verbal at baseline, however, [facility] No family available to gather other history. Upon evaluation, pt awake, however, unable to respond to commands to move extremities. Pt very resistive to movement with heavy posterior lean when attempted to sit at EOB. Required total A +2 for bed mobility, however, unable to come up to upright sitting secondary to resistance to movement. Recommending return to SNF at d/c to increase independence with functional mobility. Will continue to follow to increase functional mobility independence.     Follow Up Recommendations SNF    Equipment Recommendations  None recommended by PT    Recommendations for Other Services       Precautions / Restrictions Precautions Precautions: Fall Precaution Comments: Pt admitted secondary to fall at SNF Restrictions Weight Bearing Restrictions: No      Mobility  Bed Mobility Overal bed mobility: Needs Assistance Bed Mobility: Supine to Sit;Sit to Supine     Supine to sit: +2 for physical assistance;Total assist Sit to supine: +2 for physical assistance;Total assist   General bed mobility comments: RN present in room. Pt extremely resistive to bed mobility and required max A +2 to attempt bed mobility. Pt unable to come up to full upright sitting secondary to resistance. Pt with heavy posterior lean and unable to respond to cues to sit upright. Pt grabbing at PT and  nursing when attempting to sit up and pushing posteriorly.   Transfers                 General transfer comment: Not attempted secondary to increased resistance to movement and decreased safety.   Ambulation/Gait                Stairs            Wheelchair Mobility    Modified Rankin (Stroke Patients Only)       Balance Overall balance assessment: Needs assistance Sitting-balance support: No upper extremity supported;Feet unsupported Sitting balance-Leahy Scale: Poor Sitting balance - Comments: Unable to get up to full upright sitting secondary to increased resistance. Max to total A required to maintain sitting balance.  Postural control: Posterior lean                                   Pertinent Vitals/Pain Pain Assessment: Faces Faces Pain Scale: Hurts a little bit Pain Location: generalized  Pain Descriptors / Indicators: Grimacing Pain Intervention(s): Limited activity within patient's tolerance;Monitored during session;Repositioned    Home Living Family/patient expects to be discharged to:: Skilled nursing facility                 Additional Comments: RN in room upon entry and states pt will be going back to SNF     Prior Function Level of Independence: Needs assistance   Gait / Transfers Assistance Needed: Per RN, pt was ambulatory PTA at SNF.   ADL's / Homemaking Assistance Needed: dependent  Hand Dominance        Extremity/Trunk Assessment   Upper Extremity Assessment Upper Extremity Assessment: Defer to OT evaluation    Lower Extremity Assessment Lower Extremity Assessment: Difficult to assess due to impaired cognition (unable to follow commands to move LE)    Cervical / Trunk Assessment Cervical / Trunk Assessment: Kyphotic  Communication   Communication: Expressive difficulties (Non-verbal )  Cognition Arousal/Alertness: Awake/alert Behavior During Therapy:  (resistive to movement ) Overall  Cognitive Status: History of cognitive impairments - at baseline                                 General Comments: History of alzheimer's dementia.       General Comments General comments (skin integrity, edema, etc.): No family present during session, however, RN able to provide some history. Reports pt will be returning to SNF.     Exercises     Assessment/Plan    PT Assessment Patient needs continued PT services  PT Problem List Decreased strength;Decreased range of motion;Decreased activity tolerance;Decreased balance;Decreased mobility;Decreased coordination;Decreased cognition;Decreased knowledge of use of DME;Decreased safety awareness;Decreased knowledge of precautions       PT Treatment Interventions DME instruction;Gait training;Functional mobility training;Therapeutic exercise;Therapeutic activities;Balance training;Neuromuscular re-education;Patient/family education;Cognitive remediation    PT Goals (Current goals can be found in the Care Plan section)  Acute Rehab PT Goals Patient Stated Goal: unable to state  PT Goal Formulation: Patient unable to participate in goal setting Time For Goal Achievement: 11/12/16 Potential to Achieve Goals: Fair    Frequency Min 2X/week   Barriers to discharge        Co-evaluation               AM-PAC PT "6 Clicks" Daily Activity  Outcome Measure Difficulty turning over in bed (including adjusting bedclothes, sheets and blankets)?: Total Difficulty moving from lying on back to sitting on the side of the bed? : Total Difficulty sitting down on and standing up from a chair with arms (e.g., wheelchair, bedside commode, etc,.)?: Total Help needed moving to and from a bed to chair (including a wheelchair)?: Total Help needed walking in hospital room?: Total Help needed climbing 3-5 steps with a railing? : Total 6 Click Score: 6    End of Session   Activity Tolerance: Other (comment) (resistive to movement  ) Patient left: in bed;with call [place]/phone within reach;with bed alarm set;with nursing/sitter in room Nurse Communication: Mobility status PT Visit Diagnosis: Repeated falls (R29.6);Other symptoms and signs involving the nervous system (R29.898);Other abnormalities of gait and mobility (R26.89)    Time: 1105-1120 PT Time Calculation (min) (ACUTE ONLY): 15 min   Charges:   PT Evaluation $PT Eval Moderate Complexity: 1 Procedure     PT G Codes:        Margot Chimes, PT, DPT  Acute Rehabilitation Services  Pager: [phone]   Melvyn Novas 10/29/2016, 11:41 AM

## 2016-10-29 NOTE — Procedures (Signed)
ELECTROENCEPHALOGRAM REPORT  Date of Study: 10/29/2016  Patient's Name: Alexandria Lowe MRN: [id number] Date of Birth: 1955-12-16  Referring Provider: Dr. Trula Orehristina Rama  Clinical History: This is a 61 year old woman with altered mental status.  Medications: Valproate Sodium (DEPAKENE) solution 625 mg  levETIRAcetam (KEPPRA) 100 MG/ML solution 750 mg  acetaminophen (TYLENOL) tablet 650 mg  alum & mag hydroxide-simeth (MAALOX/MYLANTA) 200-200-20 MG/5ML suspension 30 mL  ampicillin (OMNIPEN) 1 g in sodium chloride 0.9 % 50 mL IVPB  brimonidine (ALPHAGAN) 0.15 % ophthalmic solution 1 drop  brinzolamide (AZOPT) 1 % ophthalmic suspension 1 drop  calcium-vitamin D (OSCAL WITH D) 500-200 MG-UNIT per tablet 1 tablet  enoxaparin (LOVENOX) injection 40 mg  feeding supplement (ENSURE ENLIVE) (ENSURE ENLIVE) liquid 237 mL  fluticasone (FLONASE) 50 MCG/ACT nasal spray 2 spray  hydrALAZINE (APRESOLINE) injection 10 mg  hydrocerin (EUCERIN) cream  hydrochlorothiazide (MICROZIDE) capsule 12.5 mg  latanoprost (XALATAN) 0.005 % ophthalmic solution 1 drop  lidocaine (LIDODERM) 5 % 1 patch  lisinopril (PRINIVIL,ZESTRIL) tablet 20 mg  loratadine (CLARITIN) tablet 10 mg  neomycin-bacitracin-polymyxin (NEOSPORIN) ointment 1 application  ondansetron (ZOFRAN) injection 4 mg  sertraline (ZOLOFT) tablet 100 mg   Technical Summary: A multichannel digital EEG recording measured by the international 10-20 system with electrodes applied with paste and impedances below 5000 ohms performed in our laboratory with EKG monitoring in an awake and unresponsive patient.  Hyperventilation and photic stimulation were not performed.  The digital EEG was referentially recorded, reformatted, and digitally filtered in a variety of bipolar and referential montages for optimal display.    Description: The patient is awake and unresponsive during the recording. This is a limited study with only a total of around 6-7 minutes of  EEG not obscured by chewing artifact. In between artifact, there is no clear posterior dominant rhythm seen. The record is symmetric with no focal slowing seen. Sleep is not captured. Hyperventilation and photic stimulation were not performed.  There were no epileptiform discharges or electrographic seizures seen.    EKG lead was unremarkable.  Impression: This awake EEG is limited due to chewing artifact throughout most of the study. In between artifact, EEG was within normal limits.  Clinical Correlation: A normal EEG does not exclude a clinical diagnosis of epilepsy. Clinical correlation is advised.   Patrcia DollyKaren Kavontae Pritchard, M.D.

## 2016-10-29 NOTE — Progress Notes (Signed)
EEG Completed; Results Pending  

## 2016-10-29 NOTE — Progress Notes (Signed)
Progress Note    Alexandria Lowe  ZOX:[id number] DOB: [birth year]  DOA: 10/27/2016 PCP: Ron Parker, MD    Brief Narrative:   Chief complaint: Follow-up generalized weakness/fall  Medical records reviewed and are as summarized below:  Alexandria Lowe is an 61 y.o. female the PMH of Alzheimer's dementia, seizure disorder, hypertension and chronic normocytic anemia who was admitted 10/27/16 from her SNF after suffering from an unwitnessed fall. At baseline, the patient is minimally communicative and ambulates. Nursing staff reported that she was more somnolent than usual and not able to follow commands. CT of the head and cervical spine were unremarkable. Noted to have a mild increase in her creatinine and evidence of possible UTI on urinalysis.  Assessment/Plan:   Principal Problem:   Acute lower UTI complicated by acute encephalopathy/depressed level of consciousness CT of the head was negative for intracranial abnormality other than significant atrophy, encephalopathy likely from UTI.  Given a 1 L normal saline bolus and started on empiric Rocephin. Urine culture positive for Enterococcus faecalis, follow-up sensitivities and switch antibiotics to ampicillin. Given ongoing unresponsiveness and h/o seizures, EEG ordered, but has not yet been done. ? Seizure/ post ictal status.  Active Problems:   Dementia/Alzheimer's disease Supportive care. Mental status appears to be back to baseline. PT evaluation performed with recommendations for ongoing SNF care.    Convulsions/seizures (HCC) Continue Keppra and valproic acid. Follow-up EEG.    Essential hypertension Lisinopril/HCTZ held given acute kidney injury. Blood pressure up. Continue hydralazine as needed. Resume oral medications.    Fall No fractures identified on radiographic evaluation done on admission.    Thrombocytopenia (HCC) Mild. May be an acute phase reaction, as platelet count was normal in March of this year.    Acute  kidney injury/Stage II-III CKD GFR has ranged from 42-greater than 60, and creatinine is consistent with stage II-III chronic kidney disease, and has improved with IV fluids. Creatinine 1.33 ---> 0.82.    HIV screening The patient falls between the ages of 13-64 and should be screened for HIV, therefore HIV testing ordered: Nonreactive.    Underweight/moderate protein calorie malnutrition Body mass index is 21.22 kg/m. Dietitian following. Supplements ordered.   Family Communication/Anticipated D/C date and plan/Code Status   DVT prophylaxis: Lovenox ordered. Code Status: Full Code.  Family Communication: No family at the bedside. Disposition Plan: SNF either today or tomorrow, depending on when EEG gets done.   Medical Consultants:    None.   Procedures:    None  Anti-Infectives:    Rocephin 10/27/16--->  Subjective:   Awake and alert, nonverbal which is her baseline. Nursing staff feeding the patient who is eating now.  Objective:    Vitals:   10/28/16 1703 10/28/16 2214 10/29/16 0500 10/29/16 0926  BP: 126/75 (!) 158/57 (!) 169/79 (!) 162/79  Pulse: 75 60 (!) 58 60  Resp: 18 17 16 16   Temp: 98.2 F (36.8 C) 99 F (37.2 C) 97.2 F (36.2 C) 98.2 F (36.8 C)  TempSrc: Oral Oral Oral Oral  SpO2: 98% 100% 94% 100%  Weight:  52.6 kg (116 lb)      Intake/Output Summary (Last 24 hours) at 10/29/16 1253 Last data filed at 10/29/16 1059  Gross per 24 hour  Intake              405 ml  Output                0 ml  Net  405 ml   Filed Weights   10/27/16 1252 10/27/16 2105 10/28/16 2214  Weight: 60.8 kg (134 lb) 54.3 kg (119 lb 9.6 oz) 52.6 kg (116 lb)    Exam: General exam: Awake, alert, and being fed by nursing staff. Respiratory system: Diminished breath sounds, otherwise clear and with normal respiratory effort. Cardiovascular system: Regular rate, and rhythm. No murmurs, rubs, or gallops. Gastrointestinal system: Abdomen is soft, nontender,  nondistended with normal active bowel sounds. Central nervous system: Nonverbal, disoriented, nonfocal. Extremities: No clubbing, cyanosis or edema. Skin: Bruise to the left forehead. Psychiatry: Flat affect.   Data Reviewed:   I have personally reviewed following labs and imaging studies:  Labs: Basic Metabolic Panel:  Recent Labs Lab 10/27/16 1400 10/28/16 0617 10/29/16 0632  NA 142 140 138  K 4.1 3.7 3.6  CL 107 109 110  CO2 26 25 22   GLUCOSE 88 105* 86  BUN 26* 15 9  CREATININE 1.33* 0.96 0.82  CALCIUM 9.1 8.6* 8.2*   GFR Estimated Creatinine Clearance: 57 mL/min (by C-G formula based on SCr of 0.82 mg/dL). Liver Function Tests:  Recent Labs Lab 10/27/16 1400  AST 43*  ALT 34  ALKPHOS 45  BILITOT 0.6  PROT 6.6  ALBUMIN 3.1*   CBC:  Recent Labs Lab 10/27/16 1400 10/28/16 0617 10/29/16 0632  WBC 7.6 7.5 6.5  NEUTROABS 5.2 5.5  --   HGB 11.5* 10.1* 10.1*  HCT 35.9* 32.1* 31.1*  MCV 85.9 84.5 85.7  PLT 104* 127* 113*   Cardiac Enzymes:  Recent Labs Lab 10/27/16 1400  CKTOTAL 131   Microbiology Recent Results (from the past 240 hour(s))  Urine culture     Status: Abnormal   Collection Time: 10/27/16  4:57 PM  Result Value Ref Range Status   Specimen Description URINE, CATHETERIZED  Final   Special Requests NONE  Final   Culture >=100,000 COLONIES/mL ENTEROCOCCUS FAECALIS (A)  Final   Report Status 10/29/2016 FINAL  Final   Organism ID, Bacteria ENTEROCOCCUS FAECALIS (A)  Final      Susceptibility   Enterococcus faecalis - MIC*    AMPICILLIN <=2 SENSITIVE Sensitive     LEVOFLOXACIN 1 SENSITIVE Sensitive     NITROFURANTOIN <=16 SENSITIVE Sensitive     VANCOMYCIN 1 SENSITIVE Sensitive     * >=100,000 COLONIES/mL ENTEROCOCCUS FAECALIS  MRSA PCR Screening     Status: None   Collection Time: 10/28/16  6:37 AM  Result Value Ref Range Status   MRSA by PCR NEGATIVE NEGATIVE Final    Comment:        The GeneXpert MRSA Assay (FDA approved for  NASAL specimens only), is one component of a comprehensive MRSA colonization surveillance program. It is not intended to diagnose MRSA infection nor to guide or monitor treatment for MRSA infections.     Radiology: Ct Head Wo Contrast  Result Date: 10/27/2016 CLINICAL DATA:  Unwitnessed fall. EXAM: CT HEAD WITHOUT CONTRAST CT CERVICAL SPINE WITHOUT CONTRAST TECHNIQUE: Multidetector CT imaging of the head and cervical spine was performed following the standard protocol without intravenous contrast. Multiplanar CT image reconstructions of the cervical spine were also generated. COMPARISON:  08/10/2016 FINDINGS: CT HEAD FINDINGS Brain: There is no evidence for acute hemorrhage, hydrocephalus, mass lesion, or abnormal extra-axial fluid collection. No definite CT evidence for acute infarction. Diffuse loss of parenchymal volume is consistent with atrophy. Patchy low attenuation in the deep hemispheric and periventricular white matter is nonspecific, but likely reflects chronic microvascular ischemic demyelination. Vascular:  No hyperdense vessel or unexpected calcification. Skull: No evidence for fracture. No worrisome lytic or sclerotic lesion. Sinuses/Orbits: The visualized paranasal sinuses and mastoid air cells are clear. Visualized portions of the globes and intraorbital fat are unremarkable. Other: Left frontotemporal scalp contusion noted. CT CERVICAL SPINE FINDINGS Alignment: No subluxation. Reversal of normal cervical lordosis is evident. Skull base and vertebrae: No acute fracture. No primary bone lesion or focal pathologic process. Soft tissues and spinal canal: No prevertebral fluid or swelling. No visible canal hematoma. Disc levels:  Maintained. Upper chest: Negative. Other: None. IMPRESSION: 1. No acute intracranial abnormality. Atrophy with chronic small vessel white matter ischemic disease. 2. No acute cervical spine fracture. 3. Loss of cervical lordosis. This can be related to patient  positioning, muscle spasm or soft tissue injury. Electronically Signed   By: Kennith Center M.D.   On: 10/27/2016 17:20   Ct Cervical Spine Wo Contrast  Result Date: 10/27/2016 CLINICAL DATA:  Unwitnessed fall. EXAM: CT HEAD WITHOUT CONTRAST CT CERVICAL SPINE WITHOUT CONTRAST TECHNIQUE: Multidetector CT imaging of the head and cervical spine was performed following the standard protocol without intravenous contrast. Multiplanar CT image reconstructions of the cervical spine were also generated. COMPARISON:  08/10/2016 FINDINGS: CT HEAD FINDINGS Brain: There is no evidence for acute hemorrhage, hydrocephalus, mass lesion, or abnormal extra-axial fluid collection. No definite CT evidence for acute infarction. Diffuse loss of parenchymal volume is consistent with atrophy. Patchy low attenuation in the deep hemispheric and periventricular white matter is nonspecific, but likely reflects chronic microvascular ischemic demyelination. Vascular: No hyperdense vessel or unexpected calcification. Skull: No evidence for fracture. No worrisome lytic or sclerotic lesion. Sinuses/Orbits: The visualized paranasal sinuses and mastoid air cells are clear. Visualized portions of the globes and intraorbital fat are unremarkable. Other: Left frontotemporal scalp contusion noted. CT CERVICAL SPINE FINDINGS Alignment: No subluxation. Reversal of normal cervical lordosis is evident. Skull base and vertebrae: No acute fracture. No primary bone lesion or focal pathologic process. Soft tissues and spinal canal: No prevertebral fluid or swelling. No visible canal hematoma. Disc levels:  Maintained. Upper chest: Negative. Other: None. IMPRESSION: 1. No acute intracranial abnormality. Atrophy with chronic small vessel white matter ischemic disease. 2. No acute cervical spine fracture. 3. Loss of cervical lordosis. This can be related to patient positioning, muscle spasm or soft tissue injury. Electronically Signed   By: Kennith Center M.D.    On: 10/27/2016 17:20    Medications:   . brimonidine  1 drop Both Eyes BID  . brinzolamide  1 drop Both Eyes BID  . calcium-vitamin D  1 tablet Oral Q breakfast  . enoxaparin (LOVENOX) injection  40 mg Subcutaneous Q24H  . feeding supplement (ENSURE ENLIVE)  237 mL Oral BID BM  . fluticasone  2 spray Each Nare Daily  . hydrocerin   Topical QHS  . lisinopril  20 mg Oral Q breakfast   Or  . hydrochlorothiazide  12.5 mg Oral Q breakfast  . latanoprost  1 drop Both Eyes QHS  . levETIRAcetam  750 mg Oral BID  . lidocaine  1 patch Transdermal Q24H  . loratadine  10 mg Oral Q breakfast  . sertraline  100 mg Oral Daily  . Valproate Sodium  625 mg Oral BID   Continuous Infusions: . sodium chloride 10 mL/hr at 10/29/16 1004  . ampicillin (OMNIPEN) IV 1 g (10/29/16 1214)    Medical decision making is of Moderate complexity and this patient is at moderate risk of deterioration, therefore  this is a level 2 visit.  (> 4 problem points, 1 data points, moderate risk: Need 2 out of 3)   Problems/DDx Points   Self limited or minor (max 2)       1   Established problem, stable (HTN, CKD)       1  2  Established problem, worsening       2   New problem, no additional W/U planned (max 1)       3   New problem, additional W/U planned        4  4   Data Reviewed Points   Review/order clinical lab tests       1  1  Review/order x-rays       1   Review/order tests (Echo, EKG, PFTs, etc)       1   Discussion of test results w/ performing MD       1   Independent review of image, tracing or specimen       2    Decision to obtain old records       1   Review and summation of old records       2     Level of risk Presenting prob Diagnostics Management   Minimal 1 self limited/minor Labs CXR EKG/EEG U/A U/S Rest Gargles Bandages Dressings   Low 2 or more self limited/minor 1 stable chronic Acute uncomplicated illness Tests (PFTS) Non-CV imaging Arterial labs Biopsies of skin OTC  drugs Minor surgery-no risk PT OT IVF without additives    Moderate 1 or more chronic illnesses w/ mild exac, progression or S/E from tx 2 or more stable chronic illnesses Undiagnosed new problem w/ uncertain prognosis Acute complicated injury  Stress tests Endoscopies with no risk factors Deep needle or incisional bx CV imaging without risk LP Thoracentesis Paracentesis Minor surgery w/ risks Elective major surgery w/ no risk (open, percutaneous or endoscopic) Prescription drugs Therapeutic nucl med IVF with additives Closed tx of fracture/dislocation    High Severe exac of chronic illness Acute or chronic illness/injury may pose a threat to life or bodily function (ARF) Change in neuro status    CV imaging w/ contrast and risk Cardio electophysiologic tests Endoscopies w/ risk Discography Elective major surgery Emergency major surgery Parenteral controlled substances Drug therapy req monitoring for toxicity DNR/de-escalation of care    MDM Prob points Data points Risk   Straightforward    <1    <1    Min   Low complexity    2    2    Low   Moderate    3    3    Mod  x  High Complexity    4 or more    4 or more    High       LOS: 1 day   Nasteho Glantz  [facility] Pager [phone]. If unable to reach me by pager, please call my cell phone at [phone].  *Please refer to [email or web address], password [email or web address] to get updated schedule on who will round on this patient, as hospitalists switch teams weekly. If 7PM-7AM, please contact night-coverage at [email or web address], password [email or web address] for any overnight needs.  10/29/2016, 12:53 PM

## 2016-10-30 ENCOUNTER — Encounter (HOSPITAL_COMMUNITY): Payer: Self-pay | Admitting: Internal Medicine

## 2016-10-30 DIAGNOSIS — G92 Toxic encephalopathy: Secondary | ICD-10-CM | POA: Diagnosis present

## 2016-10-30 DIAGNOSIS — N183 Chronic kidney disease, stage 3 unspecified: Secondary | ICD-10-CM | POA: Diagnosis present

## 2016-10-30 DIAGNOSIS — G929 Unspecified toxic encephalopathy: Secondary | ICD-10-CM | POA: Diagnosis present

## 2016-10-30 DIAGNOSIS — Z8669 Personal history of other diseases of the nervous system and sense organs: Secondary | ICD-10-CM

## 2016-10-30 HISTORY — DX: Chronic kidney disease, stage 3 unspecified: N18.30

## 2016-10-30 MED ORDER — AMPICILLIN 250 MG PO CAPS: 250.0000 mg | ORAL_CAPSULE | Freq: Four times a day (QID) | ORAL | 0 refills | Status: DC

## 2016-10-30 NOTE — Evaluation (Signed)
Occupational Therapy Evaluation Patient Details Name: Alexandria Lowe MRN: [id number] DOB: 11/04/1955 Today's Date: 10/30/2016    History of Present Illness Pt is 61 y/o female admitted secondary to unwitnessed fall and decreased responsivity. Upon admission, pt found to have acute UTI. CT of head and spine negative for acute abnormality. PMH includes alzheimer's dementia, HTN, thrombocytopenia, seizure disorder, and history of falls.    Clinical Impression   Patient evaluated by Occupational Therapy with no further acute OT needs identified. All further treatment to defer to SNF level care at this time. Pt is total (A) for all adls and nonambulatory at this time. Ot to sign off acutely.     Follow Up Recommendations  SNF;Supervision/Assistance - 24 hour    Equipment Recommendations  Other (comment) (defer to SNF)    Recommendations for Other Services       Precautions / Restrictions Precautions Precautions: Fall Precaution Comments: Pt admitted secondary to fall at SNF Restrictions Weight Bearing Restrictions: No      Mobility Bed Mobility Overal bed mobility: Needs Assistance Bed Mobility: Rolling Rolling: Total assist         General bed mobility comments: pt resistance to R to L log roll at this time for peri care. Pt will require two person (A) to static sit. Pt is a hoyer lift to the chair from supine position  Transfers                 General transfer comment: not attempted    Balance                                           ADL either performed or assessed with clinical judgement   ADL Overall ADL's : Needs assistance/impaired     Grooming: Total assistance;Bed level Grooming Details (indicate cue type and reason): hand over hand to wash face     Lower Body Bathing: Total assistance;Bed level Lower Body Bathing Details (indicate cue type and reason): attempted hand over hand to have patient (A) to reduce startle and  resistance. pt with bil LE adducted firmly making peri care difficult                        General ADL Comments: pt no awareness to incontinence and in a fetal position. Pt provided new linen gown and hygiene . pt not following any commands.      Vision         Perception     Praxis      Pertinent Vitals/Pain Pain Assessment: No/denies pain     Hand Dominance  (unknown)   Extremity/Trunk Assessment Upper Extremity Assessment Upper Extremity Assessment: Generalized weakness;Difficult to assess due to impaired cognition   Lower Extremity Assessment Lower Extremity Assessment: Defer to PT evaluation   Cervical / Trunk Assessment Cervical / Trunk Assessment: Kyphotic   Communication Communication Communication: Expressive difficulties (Non-verbal )   Cognition Arousal/Alertness: Awake/alert Behavior During Therapy: Flat affect (resistive to movement ) Overall Cognitive Status: History of cognitive impairments - at baseline                                 General Comments: History of alzheimer's dementia.    General Comments  noted large bruise and bump on L side of head  Exercises     Shoulder Instructions      Home Living Family/patient expects to be discharged to:: Skilled nursing facility                                 Additional Comments: notes in chart report to return to SNF level care      Prior Functioning/Environment Level of Independence: Needs assistance  Gait / Transfers Assistance Needed: Per RN, pt was ambulatory PTA at SNF.  ADL's / Homemaking Assistance Needed: dependent            OT Problem List:        OT Treatment/Interventions:      OT Goals(Current goals can be found in the care plan section) Acute Rehab OT Goals Patient Stated Goal: unable to state  Potential to Achieve Goals: Fair  OT Frequency:     Barriers to D/C:            Co-evaluation              AM-PAC PT "6  Clicks" Daily Activity     Outcome Measure Help from another person eating meals?: Total Help from another person taking care of personal grooming?: Total Help from another person toileting, which includes using toliet, bedpan, or urinal?: Total Help from another person bathing (including washing, rinsing, drying)?: Total Help from another person to put on and taking off regular upper body clothing?: Total Help from another person to put on and taking off regular lower body clothing?: Total 6 Click Score: 6   End of Session Nurse Communication: Mobility status;Precautions;Need for lift equipment  Activity Tolerance: Patient tolerated treatment well Patient left: in bed;with call [place]/phone within reach;with bed alarm set  OT Visit Diagnosis: Unsteadiness on feet (R26.81)                Time: 9629-5284 OT Time Calculation (min): 16 min Charges:  OT General Charges $OT Visit: 1 Procedure OT Evaluation $OT Eval Moderate Complexity: 1 Procedure G-Codes:      Mateo Flow   OTR/L Pager: [phone] Office: [phone] .   Boone Master B 10/30/2016, 9:40 AM

## 2016-10-30 NOTE — Discharge Summary (Addendum)
Physician Discharge Summary  Alexandria Lowe ZOX:[id number] DOB: August 13, 1955 DOA: 10/27/2016  PCP: Ron Parker, MD  Admit date: 10/27/2016 Discharge date: 10/30/2016  Admitted From: SNF Discharge disposition: SNF   Recommendations for Outpatient Follow-Up:   1. Limit sedating medications, if possible.  Ativan d/c'd. Consider reducing dose of Remeron.   Discharge Diagnosis:   Principal Problem:   Toxic encephalopathy from Acute enterococcal lower UTI Active Problems:   Dementia   Alzheimer's disease   Convulsions/seizures (HCC)   Depressed level of consciousness   Essential hypertension   Fall at home, initial encounter   Thrombocytopenia (HCC)   Mild renal insufficiency/stage III CKD   UTI (urinary tract infection)   AKI (acute kidney injury) (HCC)   Malnutrition of moderate degree    Discharge Condition: Improved.  Diet recommendation: Low sodium, heart healthy.    History of Present Illness:   Alexandria Lowe Lowe is an 61 y.o. female the PMH of Alzheimer's dementia, seizure disorder, hypertension and chronic normocytic anemia who was admitted 10/27/16 from her SNF after suffering from an unwitnessed fall. At baseline, the patient is minimally communicative and ambulates. Nursing staff reported that she was more somnolent than usual and not able to follow commands. CT of the head and cervical spine were unremarkable. Noted to have a mild increase in her creatinine and evidence of possible UTI on urinalysis.   Hospital Course by Problem:   Principal Problem:   Acute lower UTI complicated by acute encephalopathy/depressed level of consciousness CT of the head was negative for intracranial abnormality other than significant atrophy, encephalopathy likely from UTI.  Given a 1 L normal saline bolus and started on empiric Rocephin. Urine culture positive for Enterococcus faecalis, discharge on 3 days of oral ampicillin. Given ongoing unresponsiveness and h/o seizures, EEG was performed  and was within normal limits.  Active Problems:   Dementia/Alzheimer's disease Supportive care. Mental status appears to be back to baseline. PT evaluation performed with recommendations for ongoing SNF care.    Convulsions/seizures (HCC) Continue Keppra and valproic acid. EEG WNL.    Essential hypertension Lisinopril/HCTZ held given acute kidney injury. Blood pressure improved but somewhat labile, lisinopril/HCTZ resumed 10/29/16.    Fall No fractures identified on radiographic evaluation done on admission.    Thrombocytopenia (HCC) Mild. May be an acute phase reaction, as platelet count was normal in March of this year.    Acute kidney injury/Stage II-III CKD GFR has ranged from 42-greater than 60, and creatinine is consistent with stage II-III chronic kidney disease, and has improved with IV fluids. Creatinine 1.33 ---> 0.82.    HIV screening The patient falls between the ages of 13-64 and should be screened for HIV, therefore HIV testing ordered: Nonreactive.    Underweight/moderate protein calorie malnutrition Body mass index is 22.86 kg/m. Dietitian following. Supplements ordered.  Medical Consultants:    None.   Discharge Exam:   Vitals:   10/29/16 2144 10/30/16 0440  BP: (!) 141/68 (!) 152/85  Pulse: 64 60  Resp: 16 16  Temp: 98.9 F (37.2 C) 97.4 F (36.3 C)   Vitals:   10/29/16 0926 10/29/16 1735 10/29/16 2144 10/30/16 0440  BP: (!) 162/79 (!) 99/54 (!) 141/68 (!) 152/85  Pulse: 60 70 64 60  Resp: 16 17 16 16   Temp: 98.2 F (36.8 C) 98.3 F (36.8 C) 98.9 F (37.2 C) 97.4 F (36.3 C)  TempSrc: Oral Oral Oral Oral  SpO2: 100% 100% 100% 98%  Weight:   56.7 kg (  125 lb)   Height:   5\' 2"  (1.575 m)     General exam: Appears calm and comfortable. Non-verbal. Respiratory system: Remains clear to auscultation but dimnished. Respiratory effort remains normal. Cardiovascular system: Unchanged: S1 & S2 heard, RRR. No JVD,  rubs, gallops or clicks. No  murmurs. Gastrointestinal system: Unchanged: Abdomen is nondistended, soft and nontender. No organomegaly or masses felt. Normal bowel sounds heard. Central nervous system: Alert, unable to assess orientation: Non-verbal. No obvious focal neurological deficits. Extremities: Unchanged: No clubbing,  or cyanosis. No edema. Skin: No rashes or ulcers. Bruising to upper left forehead. Psychiatry: Judgement and insight are impaired. Mood & affect flat.    The results of significant diagnostics from this hospitalization (including imaging, microbiology, ancillary and laboratory) are listed below for reference.     Procedures and Diagnostic Studies:   Ct Head Wo Contrast  Result Date: 10/27/2016 CLINICAL DATA:  Unwitnessed fall. EXAM: CT HEAD WITHOUT CONTRAST CT CERVICAL SPINE WITHOUT CONTRAST TECHNIQUE: Multidetector CT imaging of the head and cervical spine was performed following the standard protocol without intravenous contrast. Multiplanar CT image reconstructions of the cervical spine were also generated. COMPARISON:  08/10/2016 FINDINGS: CT HEAD FINDINGS Brain: There is no evidence for acute hemorrhage, hydrocephalus, mass lesion, or abnormal extra-axial fluid collection. No definite CT evidence for acute infarction. Diffuse loss of parenchymal volume is consistent with atrophy. Patchy low attenuation in the deep hemispheric and periventricular white matter is nonspecific, but likely reflects chronic microvascular ischemic demyelination. Vascular: No hyperdense vessel or unexpected calcification. Skull: No evidence for fracture. No worrisome lytic or sclerotic lesion. Sinuses/Orbits: The visualized paranasal sinuses and mastoid air cells are clear. Visualized portions of the globes and intraorbital fat are unremarkable. Other: Left frontotemporal scalp contusion noted. CT CERVICAL SPINE FINDINGS Alignment: No subluxation. Reversal of normal cervical lordosis is evident. Skull base and vertebrae: No  acute fracture. No primary bone lesion or focal pathologic process. Soft tissues and spinal canal: No prevertebral fluid or swelling. No visible canal hematoma. Disc levels:  Maintained. Upper chest: Negative. Other: None. IMPRESSION: 1. No acute intracranial abnormality. Atrophy with chronic small vessel white matter ischemic disease. 2. No acute cervical spine fracture. 3. Loss of cervical lordosis. This can be related to patient positioning, muscle spasm or soft tissue injury. Electronically Signed   By: Kennith CenterEric  Mansell M.D.   On: 10/27/2016 17:20   Ct Cervical Spine Wo Contrast  Result Date: 10/27/2016 CLINICAL DATA:  Unwitnessed fall. EXAM: CT HEAD WITHOUT CONTRAST CT CERVICAL SPINE WITHOUT CONTRAST TECHNIQUE: Multidetector CT imaging of the head and cervical spine was performed following the standard protocol without intravenous contrast. Multiplanar CT image reconstructions of the cervical spine were also generated. COMPARISON:  08/10/2016 FINDINGS: CT HEAD FINDINGS Brain: There is no evidence for acute hemorrhage, hydrocephalus, mass lesion, or abnormal extra-axial fluid collection. No definite CT evidence for acute infarction. Diffuse loss of parenchymal volume is consistent with atrophy. Patchy low attenuation in the deep hemispheric and periventricular white matter is nonspecific, but likely reflects chronic microvascular ischemic demyelination. Vascular: No hyperdense vessel or unexpected calcification. Skull: No evidence for fracture. No worrisome lytic or sclerotic lesion. Sinuses/Orbits: The visualized paranasal sinuses and mastoid air cells are clear. Visualized portions of the globes and intraorbital fat are unremarkable. Other: Left frontotemporal scalp contusion noted. CT CERVICAL SPINE FINDINGS Alignment: No subluxation. Reversal of normal cervical lordosis is evident. Skull base and vertebrae: No acute fracture. No primary bone lesion or focal pathologic process. Soft tissues and  spinal canal:  No prevertebral fluid or swelling. No visible canal hematoma. Disc levels:  Maintained. Upper chest: Negative. Other: None. IMPRESSION: 1. No acute intracranial abnormality. Atrophy with chronic small vessel white matter ischemic disease. 2. No acute cervical spine fracture. 3. Loss of cervical lordosis. This can be related to patient positioning, muscle spasm or soft tissue injury. Electronically Signed   By: Kennith Center M.D.   On: 10/27/2016 17:20     Labs:   Basic Metabolic Panel:  Recent Labs Lab 10/27/16 1400 10/28/16 0617 10/29/16 0632  NA 142 140 138  K 4.1 3.7 3.6  CL 107 109 110  CO2 26 25 22   GLUCOSE 88 105* 86  BUN 26* 15 9  CREATININE 1.33* 0.96 0.82  CALCIUM 9.1 8.6* 8.2*   GFR Estimated Creatinine Clearance: 57 mL/min (by C-G formula based on SCr of 0.82 mg/dL). Liver Function Tests:  Recent Labs Lab 10/27/16 1400  AST 43*  ALT 34  ALKPHOS 45  BILITOT 0.6  PROT 6.6  ALBUMIN 3.1*   No results for input(s): LIPASE, AMYLASE in the last 168 hours. No results for input(s): AMMONIA in the last 168 hours. Coagulation profile No results for input(s): INR, PROTIME in the last 168 hours.  CBC:  Recent Labs Lab 10/27/16 1400 10/28/16 0617 10/29/16 0632  WBC 7.6 7.5 6.5  NEUTROABS 5.2 5.5  --   HGB 11.5* 10.1* 10.1*  HCT 35.9* 32.1* 31.1*  MCV 85.9 84.5 85.7  PLT 104* 127* 113*   Cardiac Enzymes:  Recent Labs Lab 10/27/16 1400  CKTOTAL 131   BNP: Invalid input(s): POCBNP CBG: No results for input(s): GLUCAP in the last 168 hours. D-Dimer No results for input(s): DDIMER in the last 72 hours. Hgb A1c No results for input(s): HGBA1C in the last 72 hours. Lipid Profile No results for input(s): CHOL, HDL, LDLCALC, TRIG, CHOLHDL, LDLDIRECT in the last 72 hours. Thyroid function studies No results for input(s): TSH, T4TOTAL, T3FREE, THYROIDAB in the last 72 hours.  Invalid input(s): FREET3 Anemia work up No results for input(s): VITAMINB12,  FOLATE, FERRITIN, TIBC, IRON, RETICCTPCT in the last 72 hours. Microbiology Recent Results (from the past 240 hour(s))  Urine culture     Status: Abnormal   Collection Time: 10/27/16  4:57 PM  Result Value Ref Range Status   Specimen Description URINE, CATHETERIZED  Final   Special Requests NONE  Final   Culture >=100,000 COLONIES/mL ENTEROCOCCUS FAECALIS (A)  Final   Report Status 10/29/2016 FINAL  Final   Organism ID, Bacteria ENTEROCOCCUS FAECALIS (A)  Final      Susceptibility   Enterococcus faecalis - MIC*    AMPICILLIN <=2 SENSITIVE Sensitive     LEVOFLOXACIN 1 SENSITIVE Sensitive     NITROFURANTOIN <=16 SENSITIVE Sensitive     VANCOMYCIN 1 SENSITIVE Sensitive     * >=100,000 COLONIES/mL ENTEROCOCCUS FAECALIS  MRSA PCR Screening     Status: None   Collection Time: 10/28/16  6:37 AM  Result Value Ref Range Status   MRSA by PCR NEGATIVE NEGATIVE Final    Comment:        The GeneXpert MRSA Assay (FDA approved for NASAL specimens only), is one component of a comprehensive MRSA colonization surveillance program. It is not intended to diagnose MRSA infection nor to guide or monitor treatment for MRSA infections.      Discharge Instructions:   Discharge Instructions    Call MD for:  extreme fatigue    Complete by:  As  directed    Call MD for:  persistant nausea and vomiting    Complete by:  As directed    Call MD for:  temperature >100.4    Complete by:  As directed    Diet - low sodium heart healthy    Complete by:  As directed    Increase activity slowly    Complete by:  As directed      Allergies as of 10/30/2016   No Known Allergies     Medication List    STOP taking these medications   LORazepam 0.5 MG tablet Commonly known as:  ATIVAN     TAKE these medications   acetaminophen 500 MG tablet Commonly known as:  TYLENOL Take 500 mg by mouth every 4 (four) hours as needed for mild pain, moderate pain, fever or headache.   ampicillin 250 MG  capsule Commonly known as:  PRINCIPEN Take 1 capsule (250 mg total) by mouth 4 (four) times daily.   ASPERCREME LIDOCAINE 4 % Ptch Generic drug:  Lidocaine Apply 1 patch topically every 12 (twelve) hours. Apply to right shoulder   brimonidine 0.1 % Soln Commonly known as:  ALPHAGAN P Place 1 drop into both eyes 2 (two) times daily.   brinzolamide 1 % ophthalmic suspension Commonly known as:  AZOPT Place 1 drop into both eyes 2 (two) times daily.   Calcium-D 600-400 MG-UNIT Tabs Take 1 tablet by mouth daily with breakfast.   guaiFENesin 100 MG/5ML liquid Commonly known as:  ROBITUSSIN Take 200 mg by mouth every 6 (six) hours as needed for cough.   levETIRAcetam 100 MG/ML solution Commonly known as:  KEPPRA Take 750 mg by mouth 2 (two) times daily.   lisinopril-hydrochlorothiazide 20-12.5 MG tablet Commonly known as:  PRINZIDE,ZESTORETIC Take 1 tablet by mouth daily with breakfast.   loperamide 2 MG capsule Commonly known as:  IMODIUM Take 2 mg by mouth as needed for diarrhea or loose stools.   loratadine 10 MG tablet Commonly known as:  CLARITIN Take 10 mg by mouth daily with breakfast.   magnesium hydroxide 400 MG/5ML suspension Commonly known as:  MILK OF MAGNESIA Take 30 mLs by mouth at bedtime as needed for mild constipation.   meloxicam 15 MG tablet Commonly known as:  MOBIC Take 15 mg by mouth daily.   MINERIN Crea Apply 1 application topically at bedtime. Applies to whole body   MINTOX 200-200-20 MG/5ML suspension Generic drug:  alum & mag hydroxide-simeth Take 30 mLs by mouth as needed for indigestion or heartburn.   mirtazapine 30 MG tablet Commonly known as:  REMERON Take 30 mg by mouth at bedtime.   mometasone 50 MCG/ACT nasal spray Commonly known as:  NASONEX Place 2 sprays into the nose daily after breakfast.   neomycin-bacitracin-polymyxin [phone] ointment Apply 1 application topically as needed (for wound care).   NUTRITIONAL SHAKE  PO Take 1 Can by mouth 3 (three) times daily. Mighty Shakes   sertraline 100 MG tablet Commonly known as:  ZOLOFT Take 100 mg by mouth daily.   TRAVATAN Z 0.004 % Soln ophthalmic solution Generic drug:  Travoprost (BAK Free) Place 1 drop into both eyes at bedtime.   Valproic Acid 250 MG/5ML Soln Take 12.5 mLs by mouth 2 (two) times daily.      Follow-up Information    Ron Parker, MD. Schedule an appointment as soon as possible for a visit in 1 week(s).   Specialty:  Internal Medicine Why:  Hospital follow up. Contact information: [phone]  [address] [place] [address] [phone]            Time coordinating discharge: 35 minutes.  Signed:  RAMA,CHRISTINA  Pager [phone] [facility] 10/30/2016, 9:10 AM

## 2016-10-30 NOTE — Care Management Note (Signed)
Case Management Note  Patient Details  Name: Arnoldo MoraleBonnie L Arnesen MRN: [id number] Date of Birth: 04/10/1956  Subjective/Objective:      CM following for progression and d/c planning.               Action/Plan: Noted plan for pt to d/c to short term rehab at SNF. [facility] [place] [facility] [place] [facility]. CSW, Genelle BalVanessa Crawford working with pt and family.   Expected Discharge Date:  10/30/16               Expected Discharge Plan:  Skilled Nursing Facility  In-House Referral:  Clinical Social Work  Discharge planning Services  NA  Post Acute Care Choice:  NA Choice offered to:  NA  DME Arranged:  N/A DME Agency:  NA  HH Arranged:  NA HH Agency:  NA  Status of Service:  Completed, signed off  If discussed at [employer] Length of Stay Meetings, dates discussed:    Additional Comments:  Starlyn SkeansRoyal, Tangala Wiegert U, RN 10/30/2016, 3:04 PM

## 2016-10-30 NOTE — Discharge Instructions (Signed)

## 2016-10-30 NOTE — Clinical Social Work Placement (Addendum)
   CLINICAL SOCIAL WORK PLACEMENT  NOTE **Initiated authorization with [employer] at 2:55 pm, after hearing from patient's son with facility decision. Pending case #[id number] **Waiting to hear from the [facility] representative that patient has been assigned to regarding authorization.  Date:  10/30/2016  Patient Details  Name: Alexandria Lowe MRN: [id number] Date of Birth: 10/09/1955  Clinical Social Work is seeking post-discharge placement for this patient at the Skilled  Nursing Facility level of care (*CSW will initial, date and re-position this form in  chart as items are completed):  Yes   Patient/family provided with [facility] Clinical Social Work Department's list of facilities offering this level of care within the geographic area requested by the patient (or if unable, by the patient's family).  Yes   Patient/family informed of their freedom to choose among providers that offer the needed level of care, that participate in Medicare, Medicaid or managed care program needed by the patient, have an available bed and are willing to accept the patient.  Yes   Patient/family informed of [facility]'s ownership interest in [facility] and [facility], as well as of the fact that they are under no obligation to receive care at these facilities.  PASRR submitted to EDS on 10/30/16     PASRR number received on 10/30/16 ([id number] A - eff. 10/30/16)     Existing PASRR number confirmed on       FL2 transmitted to all facilities in geographic area requested by pt/family on 10/30/16     FL2 transmitted to all facilities within larger geographic area on       Patient informed that his/her managed care company has contracts with or will negotiate with certain facilities, including the following:        Yes   Patient/family informed of bed offers received.  [facility]     Physician recommends and patient chooses bed at      Patient to be [facility]  [facility] on  .  Patient to be transferred to facility by       Patient family notified on   of transfer.  Name of family member notified:        PHYSICIAN       Additional Comment:    _______________________________________________ Cristobal Goldmannrawford, Sherron Mapp Bradley, LCSW 10/30/2016, 3:08 PM

## 2016-10-30 NOTE — NC FL2 (Signed)
[place] MEDICAID FL2 LEVEL OF CARE SCREENING TOOL     IDENTIFICATION  Patient Name: Alexandria Lowe Birthdate: 1955/07/31 Sex: female Admission Date (Current Location): 10/27/2016  [place] and [place] Number:  [name] [id number] S Facility and Address:  [facility], [address] [place], [place] [address]      Provider Number: [id number]  Attending Physician Name and Address:  Rama, Maryruth Bun, MD  Relative Name and Phone Number:  Ashton Sabine - son; [phone] (cell), [phone] (home)    Current Level of Care: Hospital Recommended Level of Care: Skilled Nursing Facility Prior Approval Number:    Date Approved/Denied:   PASRR Number:    Discharge Plan: SNF    Current Diagnoses: Patient Active Problem List   Diagnosis Date Noted  . CKD (chronic kidney disease), stage III 10/30/2016  . H/O tonic-clonic seizures 10/30/2016  . Malnutrition of moderate degree 10/29/2016  . AKI (acute kidney injury) (HCC)   . Thrombocytopenia (HCC) 10/27/2016  . Anemia 11/05/2015  . Essential hypertension 11/05/2015  . Hyperlipidemia 11/05/2015  . "walking corpse" syndrome   . Alzheimer's disease 06/17/2014  . Dementia     Orientation RESPIRATION BLADDER Height & Weight     Self  Normal Incontinent (External urinary catheter placed 6/5) Weight: 125 lb (56.7 kg) Height:  5\' 2"  (157.5 cm)  BEHAVIORAL SYMPTOMS/MOOD NEUROLOGICAL BOWEL NUTRITION STATUS    Convulsions/Seizures (History of seizures) Continent Diet (Low sodium - Heart healthy)  AMBULATORY STATUS COMMUNICATION OF NEEDS Skin   Total Care (Patient did not ambulate with PT during evaluation on 6/4) Verbally (Patient speaks very slowly per ALF staff person) Other (Comment) (Abrasion left arm and Ecchymosis left side of head)                       Personal Care Assistance Level of Assistance  Bathing, Feeding, Dressing Bathing Assistance: Maximum assistance Feeding assistance: Limited  assistance Dressing Assistance: Maximum assistance     Functional Limitations Info  Sight, Hearing, Speech Sight Info: Adequate Hearing Info: Adequate Speech Info: Adequate (Patient talks per ALF, but very slow to respond)    SPECIAL CARE FACTORS FREQUENCY  PT (By licensed PT), OT (By licensed OT)     PT Frequency: Evaluated 6/4 and a minimun of 2X per week therapy recommended OT Frequency: Evaluated 6/5            Contractures Contractures Info: Not present    Additional Factors Info  Code Status, Allergies Code Status Info: Full Allergies Info: No known allergies           Current Medications (10/30/2016):  This is the current hospital active medication list Current Facility-Administered Medications  Medication Dose Route Frequency Provider Last Rate Last Dose  . 0.9 %  sodium chloride infusion   Intravenous Continuous Rama, Maryruth Bun, MD 10 mL/hr at 10/29/16 1004    . acetaminophen (TYLENOL) tablet 650 mg  650 mg Oral Q6H PRN Opyd, Lavone Neri, MD       Or  . acetaminophen (TYLENOL) suppository 650 mg  650 mg Rectal Q6H PRN Opyd, Lavone Neri, MD      . alum & mag hydroxide-simeth (MAALOX/MYLANTA) 200-200-20 MG/5ML suspension 30 mL  30 mL Oral PRN Opyd, Lavone Neri, MD      . ampicillin (OMNIPEN) 1 g in sodium chloride 0.9 % 50 mL IVPB  1 g Intravenous Q6H Rama, Maryruth Bun, MD 150 mL/hr at 10/30/16 0620 1 g at 10/30/16 0620  .  brimonidine (ALPHAGAN) 0.15 % ophthalmic solution 1 drop  1 drop Both Eyes BID Opyd, Lavone Neriimothy S, MD   1 drop at 10/29/16 2300  . brinzolamide (AZOPT) 1 % ophthalmic suspension 1 drop  1 drop Both Eyes BID Opyd, Lavone Neriimothy S, MD   1 drop at 10/29/16 2300  . calcium-vitamin D (OSCAL WITH D) 500-200 MG-UNIT per tablet 1 tablet  1 tablet Oral Q breakfast Rama, Maryruth Bunhristina P, MD   1 tablet at 10/30/16 0816  . enoxaparin (LOVENOX) injection 40 mg  40 mg Subcutaneous Q24H Opyd, Lavone Neriimothy S, MD   40 mg at 10/29/16 2300  . feeding supplement (ENSURE ENLIVE) (ENSURE  ENLIVE) liquid 237 mL  237 mL Oral BID BM Rama, Maryruth Bunhristina P, MD   237 mL at 10/29/16 1639  . fluticasone (FLONASE) 50 MCG/ACT nasal spray 2 spray  2 spray Each Nare Daily Opyd, Lavone Neriimothy S, MD   2 spray at 10/29/16 1110  . hydrALAZINE (APRESOLINE) injection 10 mg  10 mg Intravenous Q4H PRN Opyd, Lavone Neriimothy S, MD      . hydrocerin (EUCERIN) cream   Topical QHS Opyd, Lavone Neriimothy S, MD      . lisinopril (PRINIVIL,ZESTRIL) tablet 20 mg  20 mg Oral Q breakfast Rama, Maryruth Bunhristina P, MD       Or  . hydrochlorothiazide (MICROZIDE) capsule 12.5 mg  12.5 mg Oral Q breakfast Rama, Maryruth Bunhristina P, MD   12.5 mg at 10/30/16 0817  . latanoprost (XALATAN) 0.005 % ophthalmic solution 1 drop  1 drop Both Eyes QHS Opyd, Lavone Neriimothy S, MD   1 drop at 10/29/16 2300  . levETIRAcetam (KEPPRA) 100 MG/ML solution 750 mg  750 mg Oral BID Rama, Maryruth Bunhristina P, MD   750 mg at 10/29/16 2300  . lidocaine (LIDODERM) 5 % 1 patch  1 patch Transdermal Q24H Opyd, Lavone Neriimothy S, MD   1 patch at 10/29/16 2200  . loratadine (CLARITIN) tablet 10 mg  10 mg Oral Q breakfast Rama, Maryruth Bunhristina P, MD   10 mg at 10/30/16 0817  . neomycin-bacitracin-polymyxin (NEOSPORIN) ointment 1 application  1 application Topical PRN Opyd, Lavone Neriimothy S, MD      . ondansetron (ZOFRAN) injection 4 mg  4 mg Intravenous Q6H PRN Opyd, Lavone Neriimothy S, MD      . sertraline (ZOLOFT) tablet 100 mg  100 mg Oral Daily Rama, Maryruth Bunhristina P, MD   100 mg at 10/29/16 1113  . Valproate Sodium (DEPAKENE) solution 625 mg  625 mg Oral BID Opyd, Lavone Neriimothy S, MD   625 mg at 10/29/16 2300     Discharge Medications: Please see discharge summary for a list of discharge medications.  Relevant Imaging Results:  Relevant Lab Results:   Additional Information ss#[id number]  Cristobal GoldmannCrawford, Artyom Stencel Bradley, LCSW

## 2016-10-30 NOTE — Clinical Social Work Note (Signed)
Clinical Social Work Assessment  Patient Details  Name: Alexandria Lowe MRN: [id number] Date of Birth: 01/30/1956  Date of referral:  10/30/16               Reason for consult:  Facility Placement                Permission sought to share information with:  Family Supports Permission granted to share information::  No (Patient disoriented x4)  Name::     Alexandria Lowe  Agency::     Relationship::  Son  SolicitorContact Information:  [phone] (mobile)  Housing/Transportation Living arrangements for the past 2 months:  Assisted Living Facility [facility]([place]) Source of Information:  Other (Comment Required), Adult Children (Chart) Patient Interpreter Needed:  None Criminal Activity/Legal Involvement Pertinent to Current Situation/Hospitalization:  No - Comment as needed Significant Relationships:  Siblings, Adult Children (Son Alexandria Lowe and sister Alexandria Lowe) Lives with:  Facility Resident [facility] [facility]) Do you feel safe going back to the place where you live?  No (Son in agreement with ST rehab before returning to ALF) Need for family participation in patient care:  Yes (Comment)  Care giving concerns:  CSW talked with son by phone and he is in agreement with ST rehab based on PT and MD recommendations.   Social Worker assessment / plan:  CSW talked with son Alexandria Lowe regarding discharge disposition and recommendation of ST rehab. Patient has been at ALF since 12/24/13. Discussed PT/OT evaluations with son and he is in agreement with ST rehab and later provided CSW with a facility choice [facility]. Discussed patient's verbal ability and per son, patient's ability to communicate has been hampered by the Alzheimer's dementia.   Employment status:  Disabled (Comment on whether or not currently receiving Disability) Insurance information:    PT Recommendations:  Skilled Nursing Facility Information / Referral to community resources:  [facility] (Skilled facility lists  emailed to son )  Patient/Family's Response to care:  No concerns expressed by son regarding   Patient/Family's Understanding of and Emotional Response to Diagnosis, Current Treatment, and Prognosis:  Son expressed understanding that his mother's dementia is progressive.  Emotional Assessment Appearance:  Other (Comment Required (Did not visit with patient, talked with son by phone) Attitude/Demeanor/Rapport:  Unable to Assess Affect (typically observed):  Unable to Assess Orientation:   (Per nursing, patient disoriented X4) Alcohol / Substance use:  Never Used Psych involvement (Current and /or in the community):  No (Comment)  Discharge Needs  Concerns to be addressed:  Discharge Planning Concerns Readmission within the last 30 days:  No Current discharge risk:  None Barriers to Discharge:  Insurance Authorization   Alexandria Lowe, Alexandria Lanzo Bradley, LCSW 10/30/2016, 3:18 PM

## 2016-10-31 MED ORDER — AMPICILLIN 250 MG PO CAPS
250.0000 mg | ORAL_CAPSULE | Freq: Four times a day (QID) | ORAL | Status: DC
  Filled 2016-10-31: qty 1

## 2016-10-31 MED ORDER — AMOXICILLIN 500 MG PO CAPS
500.0000 mg | ORAL_CAPSULE | Freq: Two times a day (BID) | ORAL | Status: DC
  Administered 2016-10-31 – 2016-11-01 (×3): 500 mg via ORAL
  Filled 2016-10-31 (×4): qty 1

## 2016-10-31 MED ORDER — AMOXICILLIN 500 MG PO CAPS: 500.0000 mg | ORAL_CAPSULE | Freq: Two times a day (BID) | ORAL | Status: DC

## 2016-10-31 NOTE — Progress Notes (Signed)
Physical Therapy Treatment Patient Details Name: Alexandria MoraleBonnie L Lowe MRN: [id number] DOB: 11/22/1955 Today's Date: 10/31/2016    History of Present Illness Pt is 61 y/o female admitted secondary to unwitnessed fall and decreased responsivity. Upon admission, pt found to have acute UTI. CT of head and spine negative for acute abnormality. PMH includes alzheimer's dementia, HTN, thrombocytopenia, seizure disorder, and history of falls.     PT Comments    Pt slowly progressing towards goals. Slightly decreased resistance to movement this session, however, continues to exhibit some resistance. [facility] with manual cues for short period and then became resistive to assist. Continue to recommend SNF at d/c to increase independence with mobility, and pt was previously ambulatory. Will continue to follow acutely.    Follow Up Recommendations  SNF     Equipment Recommendations  None recommended by PT    Recommendations for Other Services       Precautions / Restrictions Precautions Precautions: Fall Precaution Comments: Pt admitted secondary to fall at SNF Restrictions Weight Bearing Restrictions: No    Mobility  Bed Mobility Overal bed mobility: Needs Assistance Bed Mobility: Supine to Sit     Supine to sit: +2 for physical assistance;Total assist Sit to supine: +2 for physical assistance;Total assist   General bed mobility comments: Pt with less resistance to mobility this session, however, continues to push posteriorly and is resistive to bed mobility. Unsafe to attempt other mobility at this time   Transfers                    Ambulation/Gait                 Stairs            Wheelchair Mobility    Modified Rankin (Stroke Patients Only)       Balance Overall balance assessment: Needs assistance Sitting-balance support: No upper extremity supported;Feet unsupported Sitting balance-Leahy Scale: Poor Sitting balance - Comments: max  to total A to maintain sitting balance  Postural control: Posterior lean                                  Cognition Arousal/Alertness: Awake/alert Behavior During Therapy: Flat affect (resistive to movement ) Overall Cognitive Status: History of cognitive impairments - at baseline                                 General Comments: History of alzheimer's dementia.       Exercises Other Exercises Other Exercises: AAROM; sidelying heel slides X 12. Did not respond to verbal commands, but assisted with movement with manual cues.  Other Exercises: AAROM; sidelying ankle pumps X 12. Did not respond to verbal commands, but assisted with movement with manual cues. Some resistance to movement towards end of repetitions     General Comments        Pertinent Vitals/Pain Pain Assessment: Faces Faces Pain Scale: No hurt    Home Living                      Prior Function            PT Goals (current goals can now be found in the care plan section) Acute Rehab PT Goals Patient Stated Goal: unable to state  PT Goal Formulation: Patient unable to participate in  goal setting Time For Goal Achievement: 11/12/16 Potential to Achieve Goals: Fair Progress towards PT goals: Progressing toward goals    Frequency    Min 2X/week      PT Plan Current plan remains appropriate    Co-evaluation              AM-PAC PT "6 Clicks" Daily Activity  Outcome Measure  Difficulty turning over in bed (including adjusting bedclothes, sheets and blankets)?: Total Difficulty moving from lying on back to sitting on the side of the bed? : Total Difficulty sitting down on and standing up from a chair with arms (e.g., wheelchair, bedside commode, etc,.)?: Total Help needed moving to and from a bed to chair (including a wheelchair)?: Total Help needed walking in hospital room?: Total Help needed climbing 3-5 steps with a railing? : Total 6 Click Score: 6     End of Session   Activity Tolerance: Other (comment) (resistive to movement ) Patient left: in bed;with call Orsak/phone within reach;with bed alarm set Nurse Communication: Mobility status PT Visit Diagnosis: Repeated falls (R29.6);Other symptoms and signs involving the nervous system (R29.898);Other abnormalities of gait and mobility (R26.89)     Time: 9604-5409 PT Time Calculation (min) (ACUTE ONLY): 10 min  Charges:  $Therapeutic Activity: 8-22 mins                    G Codes:       Margot Chimes, PT, DPT  Acute Rehabilitation Services  Pager: [phone]    Melvyn Novas 10/31/2016, 1:30 PM

## 2016-10-31 NOTE — Clinical Social Work Note (Signed)
CS[facility] and was provided with a Pending Case # - [id number] and advised that case will be assigned to [employer]. On 6/6 CSW contacted by Alan Ripperlaire with [employer] [phone]. [phone]) and clinicals faxed [phone]). CSW received call from Sarah with [employer] at 4:51 pm and was advised that clinicals reviewed and because patient is a total assist, a peer-to-peer will be needed. MD name and pager number provided to representative. Dr. Randol KernElgergawy contacted and advised of peer-to-peer. [facility] contacted and updated. CSW will be contacted on 6/7 with outcome of peer-to-peer. CSW will continue to follow and [facility] if patient approved for ST rehab.   Genelle BalVanessa Lindy Garczynski, MSW, LCSW Licensed Clinical Social Worker Clinical Social Work Department [facility] [phone]

## 2016-10-31 NOTE — Progress Notes (Signed)
Occupational Therapy Treatment Patient Details Name: Alexandria Lowe MRN: [id number] DOB: 03/09/1956 Today's Date: 10/31/2016    History of present illness Pt is 61 y/o female admitted secondary to unwitnessed fall and decreased responsivity. Upon admission, pt found to have acute UTI. CT of head and spine negative for acute abnormality. PMH includes alzheimer's dementia, HTN, thrombocytopenia, seizure disorder, and history of falls.    OT comments  Pt continues to require total A for ADLs. Pt performed grooming with hand-over-hand and Max verbal and tactile cues. [facility] in [facility] exercises; pt willing to perform LUE exercises but resistant of movement for RUE. Continue to recommend dc to SNF to increase occupational performance and participation. Defer further OT treatment to SNF at this time.   Follow Up Recommendations  SNF;Supervision/Assistance - 24 hour    Equipment Recommendations  Other (comment) (Defer to SNF)    Recommendations for Other Services      Precautions / Restrictions Precautions Precautions: Fall Precaution Comments: Pt admitted secondary to fall at SNF Restrictions Weight Bearing Restrictions: No       Mobility Bed Mobility Overal bed mobility: Needs Assistance Bed Mobility: Supine to Sit     Supine to sit: +2 for physical assistance;Total assist Sit to supine: +2 for physical assistance;Total assist   General bed mobility comments: Pt with less resistance to mobility this session, however, continues to push posteriorly and is resistive to bed mobility. Unsafe to attempt other mobility at this time   Transfers                 General transfer comment: Not attempted    Balance Overall balance assessment: Needs assistance Sitting-balance support: No upper extremity supported;Feet unsupported Sitting balance-Leahy Scale: Poor Sitting balance - Comments: max to total A to maintain sitting balance  Postural control: Posterior lean                                  ADL either performed or assessed with clinical judgement   ADL Overall ADL's : Needs assistance/impaired     Grooming: Oral care;Bed level;Total assistance;Wash/dry face Grooming Details (indicate cue type and reason): hand over hand for washing face. Pt very resistant to movement of RUE. Pt total for oral care - tendency to suck on tooth brush.                               General ADL Comments: Pt total A for all ADLs and functional mobility.      Vision       Perception     Praxis      Cognition Arousal/Alertness: Awake/alert Behavior During Therapy: Flat affect Overall Cognitive Status: History of cognitive impairments - at baseline                                 General Comments: History of alzheimer's dementia.         Exercises Exercises: General Upper Extremity General Exercises - Upper Extremity Shoulder Flexion: AAROM;Right;Left;5 reps;Supine Shoulder Horizontal ABduction: AAROM;Both;5 reps;Supine Other Exercises Other Exercises: AAROM; sidelying heel slides X 12. Did not respond to verbal commands, but assisted with movement with manual cues.  Other Exercises: AAROM; sidelying ankle pumps X 12. Did not respond to verbal commands, but assisted with movement with manual cues. Some resistance  to movement towards end of repetitions    Shoulder Instructions       General Comments No family present for session. RN reports that pt has very supportive family.  Pt currently demonstrating contracted body positioning in bed.    Pertinent Vitals/ Pain       Pain Assessment: Faces Faces Pain Scale: Hurts a little bit Pain Location: generalized  Pain Descriptors / Indicators: Grimacing Pain Intervention(s): Limited activity within patient's tolerance  Home Living                                          Prior Functioning/Environment              Frequency            Progress Toward Goals  OT Goals(current goals can now be found in the care plan section)  Progress towards OT goals: Not progressing toward goals - comment  Acute Rehab OT Goals Patient Stated Goal: unable to state   Plan Discharge plan remains appropriate    Co-evaluation                 AM-PAC PT "6 Clicks" Daily Activity     Outcome Measure   Help from another person eating meals?: Total Help from another person taking care of personal grooming?: Total Help from another person toileting, which includes using toliet, bedpan, or urinal?: Total Help from another person bathing (including washing, rinsing, drying)?: Total Help from another person to put on and taking off regular upper body clothing?: Total Help from another person to put on and taking off regular lower body clothing?: Total 6 Click Score: 6    End of Session    OT Visit Diagnosis: Unsteadiness on feet (R26.81)   Activity Tolerance Patient tolerated treatment well   Patient Left in bed;with call Lorenzetti/phone within reach;with bed alarm set   Nurse Communication Mobility status;Precautions;Need for lift equipment        Time: 1610-9604 OT Time Calculation (min): 10 min  Charges: OT General Charges $OT Visit: 1 Procedure OT Treatments $Self Care/Home Management : 8-22 mins  Kyleigha Markert, OTR/L [phone]   Theodoro Grist Victorio Creeden 10/31/2016, 1:57 PM

## 2016-10-31 NOTE — Progress Notes (Signed)
Progress Note    Alexandria Lowe  ZOX:[id number] DOB: 1956-01-02  DOA: 10/27/2016 PCP: Ron Parker, MD    Brief Narrative:   Chief complaint: Follow-up generalized weakness/fall  Medical records reviewed and are as summarized below:  Alexandria Lowe is an 61 y.o. female the PMH of Alzheimer's dementia, seizure disorder, hypertension and chronic normocytic anemia who was admitted 10/27/16 from her SNF after suffering from an unwitnessed fall. At baseline, the patient is minimally communicative and ambulates. Nursing staff reported that she was more somnolent than usual and not able to follow commands. CT of the head and cervical spine were unremarkable. Noted to have a mild increase in her creatinine and evidence of possible UTI on urinalysis.  Assessment/Plan:   Principal Problem:   Acute lower UTI complicated by acute encephalopathy/depressed level of consciousness CT of the head was negative for intracranial abnormality other than significant atrophy, encephalopathy likely from UTI.  Given a 1 L normal saline bolus and started on empiric Rocephin. Urine culture positive for Enterococcus faecalis, Patient changed to amoxicillin , EEG was performed and within normal limits    Active Problems:   Dementia/Alzheimer's disease Supportive care. Mental status appears to be back to baseline. PT evaluation performed with recommendations for ongoing SNF care.    Convulsions/seizures (HCC) Continue Keppra and valproic acid. EEG within normal limits    Essential hypertension Lisinopril/HCTZ held given acute kidney injury. Blood pressure up. Continue hydralazine as needed. Resume oral medications.    Fall No fractures identified on radiographic evaluation done on admission.    Thrombocytopenia (HCC) Mild. May be an acute phase reaction, as platelet count was normal in March of this year.    Acute kidney injury/Stage II-III CKD GFR has ranged from 42-greater than 60, and creatinine is  consistent with stage II-III chronic kidney disease, and has improved with IV fluids.     HIV screening The patient falls between the ages of 13-64 and should be screened for HIV, therefore HIV testing ordered: Nonreactive.    Underweight/moderate protein calorie malnutrition Body mass index is 22.86 kg/m. Dietitian following. Supplements ordered.   Family Communication/Anticipated D/C date and plan/Code Status   DVT prophylaxis: Lovenox ordered. Code Status: Full Code.  Family Communication: No family at the bedside. Disposition Plan: SNF  Medical Consultants:    None.   Procedures:    None  Anti-Infectives:    Rocephin 10/27/16--->  Subjective:   Awake and alert, nonverbal which is her baseline. No significant events overnight  Objective:    Vitals:   10/30/16 1702 10/30/16 2026 10/31/16 0610 10/31/16 0954  BP: 139/75 138/82 (!) 168/75 (!) 105/48  Pulse: (!) 56 65 61 65  Resp: 16 16 14 16   Temp: 98.4 F (36.9 C) 99.1 F (37.3 C) 98.2 F (36.8 C) 97.8 F (36.6 C)  TempSrc: Oral Axillary  Oral  SpO2: 99% 100% 100% 100%  Weight:      Height:        Intake/Output Summary (Last 24 hours) at 10/31/16 1550 Last data filed at 10/31/16 1535  Gross per 24 hour  Intake          1272.67 ml  Output              525 ml  Net           747.67 ml   Filed Weights   10/27/16 2105 10/28/16 2214 10/29/16 2144  Weight: 54.3 kg (119 lb 9.6 oz) 52.6 kg (116  lb) 56.7 kg (125 lb)    Exam: General exam:Awake, alert, noncommunicative Respiratory system: Diminished air entry bilaterally, clear, no wheezing  Cardiovascular system: RRR, no rubs, murmurs or gallops . Gastrointestinal system: Abdomen soft, nontender, nondistended  Central nervous system: Nonverbal, disoriented, nonfocal Extremities: No clubbing, cyanosis or edema. Skin: Left forehead bruise Psychiatry: Flat affect, impaired judgment and insight  Data Reviewed:   I have personally reviewed following labs  and imaging studies:  Labs: Basic Metabolic Panel:  Recent Labs Lab 10/27/16 1400 10/28/16 0617 10/29/16 0632  NA 142 140 138  K 4.1 3.7 3.6  CL 107 109 110  CO2 26 25 22   GLUCOSE 88 105* 86  BUN 26* 15 9  CREATININE 1.33* 0.96 0.82  CALCIUM 9.1 8.6* 8.2*   GFR Estimated Creatinine Clearance: 57 mL/min (by C-G formula based on SCr of 0.82 mg/dL). Liver Function Tests:  Recent Labs Lab 10/27/16 1400  AST 43*  ALT 34  ALKPHOS 45  BILITOT 0.6  PROT 6.6  ALBUMIN 3.1*   CBC:  Recent Labs Lab 10/27/16 1400 10/28/16 0617 10/29/16 0632  WBC 7.6 7.5 6.5  NEUTROABS 5.2 5.5  --   HGB 11.5* 10.1* 10.1*  HCT 35.9* 32.1* 31.1*  MCV 85.9 84.5 85.7  PLT 104* 127* 113*   Cardiac Enzymes:  Recent Labs Lab 10/27/16 1400  CKTOTAL 131   Microbiology Recent Results (from the past 240 hour(s))  Urine culture     Status: Abnormal   Collection Time: 10/27/16  4:57 PM  Result Value Ref Range Status   Specimen Description URINE, CATHETERIZED  Final   Special Requests NONE  Final   Culture >=100,000 COLONIES/mL ENTEROCOCCUS FAECALIS (A)  Final   Report Status 10/29/2016 FINAL  Final   Organism ID, Bacteria ENTEROCOCCUS FAECALIS (A)  Final      Susceptibility   Enterococcus faecalis - MIC*    AMPICILLIN <=2 SENSITIVE Sensitive     LEVOFLOXACIN 1 SENSITIVE Sensitive     NITROFURANTOIN <=16 SENSITIVE Sensitive     VANCOMYCIN 1 SENSITIVE Sensitive     * >=100,000 COLONIES/mL ENTEROCOCCUS FAECALIS  MRSA PCR Screening     Status: None   Collection Time: 10/28/16  6:37 AM  Result Value Ref Range Status   MRSA by PCR NEGATIVE NEGATIVE Final    Comment:        The GeneXpert MRSA Assay (FDA approved for NASAL specimens only), is one component of a comprehensive MRSA colonization surveillance program. It is not intended to diagnose MRSA infection nor to guide or monitor treatment for MRSA infections.     Radiology: No results found.  Medications:   . amoxicillin   500 mg Oral Q12H  . brimonidine  1 drop Both Eyes BID  . brinzolamide  1 drop Both Eyes BID  . calcium-vitamin D  1 tablet Oral Q breakfast  . enoxaparin (LOVENOX) injection  40 mg Subcutaneous Q24H  . feeding supplement (ENSURE ENLIVE)  237 mL Oral BID BM  . fluticasone  2 spray Each Nare Daily  . hydrocerin   Topical QHS  . lisinopril  20 mg Oral Q breakfast   Or  . hydrochlorothiazide  12.5 mg Oral Q breakfast  . latanoprost  1 drop Both Eyes QHS  . levETIRAcetam  750 mg Oral BID  . lidocaine  1 patch Transdermal Q24H  . loratadine  10 mg Oral Q breakfast  . sertraline  100 mg Oral Daily  . Valproate Sodium  625 mg Oral BID  Continuous Infusions: . sodium chloride 10 mL/hr at 10/29/16 1004        LOS: 3 days   [facility] County[facility]GAW[facility], Alexandria Radabaugh  [facility] Pager [phone]  *Please refer to [email or web address], password [email or web address] to get updated schedule on who will round on this patient, as hospitalists switch teams weekly. If 7PM-7AM, please contact night-coverage at [email or web address], password [email or web address] for any overnight needs.  10/31/2016, 3:50 PM

## 2016-11-01 DIAGNOSIS — G92 Toxic encephalopathy: Secondary | ICD-10-CM

## 2016-11-01 MED ORDER — AMOXICILLIN 500 MG PO CAPS: 500.0000 mg | ORAL_CAPSULE | Freq: Two times a day (BID) | ORAL | 0 refills | Status: AC

## 2016-11-01 NOTE — Care Management Note (Signed)
Case Management Note  Patient Details  Name: Alexandria Lowe MRN: [id number] Date of Birth: 07/05/1955  Subjective/Objective:      CM following for progression and d/c planning.               Action/Plan: Noted plan for d/c to SNF however pt insurance denied this and plan is for pt to return to ALF.  Expected Discharge Date:  10/30/16               Expected Discharge Plan:  Assisted Living / Rest Home  In-House Referral:  Clinical Social Work  Discharge planning Services  NA  Post Acute Care Choice:  NA Choice offered to:  NA  DME Arranged:  N/A DME Agency:  NA  HH Arranged:  NA HH Agency:  NA  Status of Service:  Completed, signed off  If discussed at [employer] Length of Stay Meetings, dates discussed:    Additional Comments:  Starlyn SkeansRoyal, Anthoni Geerts U, RN 11/01/2016, 12:52 PM

## 2016-11-01 NOTE — Discharge Summary (Addendum)
Alexandria Lowe, is a 61 y.o. female  DOB Dec 25, 1955  MRN [id number].  Admission date:  10/27/2016  Admitting Physician  Briscoe Deutscher, MD  Discharge Date:  11/01/2016   Primary MD  Ron Parker, MD  Recommendations for primary care physician for things to follow:  - Please check CBC, BMP in 1 week   Admission Diagnosis  Acute lower UTI [N39.0] Contusion of scalp, initial encounter [S00.03XA]   Discharge Diagnosis  Acute lower UTI [N39.0] Contusion of scalp, initial encounter [S00.03XA]    Active Problems:   Dementia   Alzheimer's disease   Essential hypertension   Thrombocytopenia (HCC)   AKI (acute kidney injury) (HCC)   Malnutrition of moderate degree   CKD (chronic kidney disease), stage III   H/O tonic-clonic seizures      Past Medical History:  Diagnosis Date  . Alzheimer's disease 06/17/2014  . Anemia 11/05/2015  . CKD (chronic kidney disease), stage III 10/30/2016  . Convulsions/seizures (HCC) 06/17/2014  . Dementia   . Depression   . Essential hypertension 11/05/2015  . Fracture of right humerus 11/05/2015  . Hyperlipidemia   . Hypertension   . Malnutrition of moderate degree 10/29/2016  . Nonverbal    "for awhile now"/sister (06/01/2015)  . Thrombocytopenia (HCC) 10/27/2016    Past Surgical History:  Procedure Laterality Date  . DILATION AND CURETTAGE OF UTERUS    . VAGINAL HYSTERECTOMY         History of present illness and  Hospital Course:     Kindly see H&P for history of present illness and admission details, please review complete Labs, Consult reports and Test reports for all details in brief  HPI  from the history and physical done on the day of admission  HPI: Alexandria Lowe a 61 y.o.femalewith medical history significant for Alzheimer dementia, seizure disorder, hypertension, and chronic normocytic anemia, now presenting to the emergency department after an  unwitnessed fall at her nursing facility. At her baseline, patient is reportedly communicative, but slow to respond and gives very simple answers only. She reportedly follows commands and ambulates on her own at baseline. She was discovered to have suffered an unwitnessed fall at the nursing facility today and was brought into the ED for evaluation of this. She was also noted to be more somnolent than usual, not responding verbally, and not following commands as she usually does. She appeared to be too weak to get up from the floor on her own, which is unusual for her. She had not been voicing any complaints leading up to this.  ED Course:Upon arrival to the ED, patient is found to be afebrile, saturating well on room air, and with vital signs stable. EKG features a sinus rhythm with early R transition. Noncontrast head CT is negative for acute intracranial abnormality and CT cervical spine is negative for fracture. Chemistry panels notable for a serum creatinine 1.33, up from an apparent baseline of less than 1. CBC is notable for a stable normocytic anemia with hemoglobin of  11.5, and a recurrent thrombocytopenia with platelets 104,000. Urinalysis is suggestive of infection and features hemoglobin on the dipstick. Urine was sent for culture, patient was given 1 L of normal saline, and treated with empiric Rocephin. She remained hemodynamically stable in the ED and has not been in any apparent respiratory distress. She'll be observed on the medical surgical unit for ongoing evaluation and management of depressed level of consciousness with fall, likely secondary to UTI.   Hospital Course  Alexandria Lowe an 61 y.o.femalethe PMH of Alzheimer's dementia, seizure disorder, hypertension and chronic normocytic anemia who was admitted 10/27/16 from her SNF after suffering from an unwitnessed fall. At baseline, the patient is minimally communicative and ambulates. Nursing staff reported that she was more  somnolent than usual and not able to follow commands. CT of the head and cervical spine were unremarkable. Noted to have a mild increase in her creatinine and evidence of UTI on urinalysis.  Acute lower UTI complicated by acute encephalopathy/depressed level of consciousness CT of the head was negative for intracranial abnormality other than significant atrophy, encephalopathy likely from UTI. Given a 1 L normal saline bolus and started on empiric Rocephin. Urine culture positive for Enterococcus faecalis, dischargeon 2days of oral amoxicillin, Given ongoing unresponsiveness and h/o seizures, EEG was performed and was within normal limits.  Dementia/Alzheimer's disease Supportive care. Mental status appears to be back to baseline. PT   Convulsions/seizures (HCC) Continue Keppra and valproic acid. EEG WNL.  Essential hypertension Lisinopril/HCTZ held given acute kidney injury.Blood pressure improved but somewhat labile, lisinopril/HCTZ resumed6/4/18.  Fall No fractures identified on radiographic evaluation done on admission.  Thrombocytopenia (HCC) Mild. May be an acute phase reaction, as platelet count was normal in March of this year.  Acute kidney injury/Stage II-III CKD GFR has ranged from 42-greater than 60, and creatinine is consistent with stage II-III chronic kidney disease, and has improved with IV fluids.  HIV screening The patient falls between the ages of 13-64 and should be screened for HIV, therefore HIV testing ordered: Nonreactive.  Underweight/moderate protein calorie malnutrition Body mass index is 22.86 kg/m.Dietitian following. Supplements ordered.       Discharge Condition:  stable   Follow UP   Contact information for follow-up providers    [name], [name], MD. Schedule an appointment as soon as possible for a visit in 1 week(s).   Specialty:  Internal Medicine Why:  Office will contact you to schedule a follow up  appointment. Thank you.  Contact information: [address] [place] [id number] [phone]            Contact information for after-discharge care    Destination    [facility] .   Specialty:  Assisted Living Facility Contact information: [address] [place] [id number] [phone]                    Discharge Instructions  and  Discharge Medications    Discharge Instructions    Call MD for:  extreme fatigue    Complete by:  As directed    Call MD for:  persistant nausea and vomiting    Complete by:  As directed    Call MD for:  temperature >100.4    Complete by:  As directed    Diet - low sodium heart healthy    Complete by:  As directed    Discharge instructions    Complete by:  As directed    Follow with Primary MD  Ron Parker, MD in 7 days   Get CBC, CMP, checked  by Primary MD next visit.    Activity: As tolerated with Full fall precautions use walker/cane & assistance as needed   Disposition ALF   Diet: Heart Healthy , with feeding assistance and aspiration precautions.  For Heart failure patients - Check your Weight same time everyday, if you gain over 2 pounds, or you develop in leg swelling, experience more shortness of breath or chest pain, call your Primary MD immediately. Follow Cardiac Low Salt Diet and 1.5 lit/day fluid restriction.   On your next visit with your primary care physician please Get Medicines reviewed and adjusted.   Please request your Prim.MD to go over all Hospital Tests and Procedure/Radiological results at the follow up, please get all Hospital records sent to your Prim MD by signing hospital release before you go home.   If you experience worsening of your admission symptoms, develop shortness of breath, life threatening emergency, suicidal or homicidal thoughts you must seek medical attention immediately by calling 911 or calling your MD immediately  if symptoms less severe.  You  Must read complete instructions/literature along with all the possible adverse reactions/side effects for all the Medicines you take and that have been prescribed to you. Take any new Medicines after you have completely understood and accpet all the possible adverse reactions/side effects.   Do not drive, operating heavy machinery, perform activities at heights, swimming or participation in water activities or provide baby sitting services if your were admitted for syncope or siezures until you have seen by Primary MD or a Neurologist and advised to do so again.  Do not drive when taking Pain medications.    Do not take more than prescribed Pain, Sleep and Anxiety Medications  Special Instructions: If you have smoked or chewed Tobacco  in the last 2 yrs please stop smoking, stop any regular Alcohol  and or any Recreational drug use.  Wear Seat belts while driving.   Please note  You were cared for by a hospitalist during your hospital stay. If you have any questions about your discharge medications or the care you received while you were in the hospital after you are discharged, you can call the unit and asked to speak with the hospitalist on call if the hospitalist that took care of you is not available. Once you are discharged, your primary care physician will handle any further medical issues. Please note that NO REFILLS for any discharge medications will be authorized once you are discharged, as it is imperative that you return to your primary care physician (or establish a relationship with a primary care physician if you do not have one) for your aftercare needs so that they can reassess your need for medications and monitor your lab values.   Increase activity slowly    Complete by:  As directed    Increase activity slowly    Complete by:  As directed      Allergies as of 11/01/2016   No Known Allergies     Medication List    STOP taking these medications   ASPERCREME LIDOCAINE 4 %  Ptch Generic drug:  Lidocaine   LORazepam 0.5 MG tablet Commonly known as:  ATIVAN     TAKE these medications   acetaminophen 500 MG tablet Commonly known as:  TYLENOL Take 500 mg by mouth every 4 (four) hours as needed for mild pain, moderate pain, fever or headache.   amoxicillin 500 MG  capsule Commonly known as:  AMOXIL Take 1 capsule (500 mg total) by mouth every 12 (twelve) hours.   brimonidine 0.1 % Soln Commonly known as:  ALPHAGAN P Place 1 drop into both eyes 2 (two) times daily.   brinzolamide 1 % ophthalmic suspension Commonly known as:  AZOPT Place 1 drop into both eyes 2 (two) times daily.   Calcium-D 600-400 MG-UNIT Tabs Take 1 tablet by mouth daily with breakfast.   guaiFENesin 100 MG/5ML liquid Commonly known as:  ROBITUSSIN Take 200 mg by mouth every 6 (six) hours as needed for cough.   levETIRAcetam 100 MG/ML solution Commonly known as:  KEPPRA Take 750 mg by mouth 2 (two) times daily.   lisinopril-hydrochlorothiazide 20-12.5 MG tablet Commonly known as:  PRINZIDE,ZESTORETIC Take 1 tablet by mouth daily with breakfast.   loperamide 2 MG capsule Commonly known as:  IMODIUM Take 2 mg by mouth as needed for diarrhea or loose stools.   loratadine 10 MG tablet Commonly known as:  CLARITIN Take 10 mg by mouth daily with breakfast.   magnesium hydroxide 400 MG/5ML suspension Commonly known as:  MILK OF MAGNESIA Take 30 mLs by mouth at bedtime as needed for mild constipation.   meloxicam 15 MG tablet Commonly known as:  MOBIC Take 15 mg by mouth daily.   MINERIN Crea Apply 1 application topically at bedtime. Applies to whole body   MINTOX 200-200-20 MG/5ML suspension Generic drug:  alum & mag hydroxide-simeth Take 30 mLs by mouth as needed for indigestion or heartburn.   mirtazapine 30 MG tablet Commonly known as:  REMERON Take 30 mg by mouth at bedtime.   mometasone 50 MCG/ACT nasal spray Commonly known as:  NASONEX Place 2 sprays into  the nose daily after breakfast.   neomycin-bacitracin-polymyxin [id number] ointment Apply 1 application topically as needed (for wound care).   NUTRITIONAL SHAKE PO Take 1 Can by mouth 3 (three) times daily. Mighty Shakes   sertraline 100 MG tablet Commonly known as:  ZOLOFT Take 100 mg by mouth daily.   TRAVATAN Z 0.004 % Soln ophthalmic solution Generic drug:  Travoprost (BAK Free) Place 1 drop into both eyes at bedtime.   Valproic Acid 250 MG/5ML Soln Take 12.5 mLs by mouth 2 (two) times daily.         Diet and Activity recommendation: See Discharge Instructions above   Consults obtained -  none   Major procedures and Radiology Reports - PLEASE review detailed and final reports for all details, in brief -      Ct Head Wo Contrast  Result Date: 10/27/2016 CLINICAL DATA:  Unwitnessed fall. EXAM: CT HEAD WITHOUT CONTRAST CT CERVICAL SPINE WITHOUT CONTRAST TECHNIQUE: Multidetector CT imaging of the head and cervical spine was performed following the standard protocol without intravenous contrast. Multiplanar CT image reconstructions of the cervical spine were also generated. COMPARISON:  08/10/2016 FINDINGS: CT HEAD FINDINGS Brain: There is no evidence for acute hemorrhage, hydrocephalus, mass lesion, or abnormal extra-axial fluid collection. No definite CT evidence for acute infarction. Diffuse loss of parenchymal volume is consistent with atrophy. Patchy low attenuation in the deep hemispheric and periventricular white matter is nonspecific, but likely reflects chronic microvascular ischemic demyelination. Vascular: No hyperdense vessel or unexpected calcification. Skull: No evidence for fracture. No worrisome lytic or sclerotic lesion. Sinuses/Orbits: The visualized paranasal sinuses and mastoid air cells are clear. Visualized portions of the globes and intraorbital fat are unremarkable. Other: Left frontotemporal scalp contusion noted. CT CERVICAL SPINE FINDINGS Alignment: No  subluxation. Reversal of normal cervical lordosis is evident. Skull base and vertebrae: No acute fracture. No primary bone lesion or focal pathologic process. Soft tissues and spinal canal: No prevertebral fluid or swelling. No visible canal hematoma. Disc levels:  Maintained. Upper chest: Negative. Other: None. IMPRESSION: 1. No acute intracranial abnormality. Atrophy with chronic small vessel white matter ischemic disease. 2. No acute cervical spine fracture. 3. Loss of cervical lordosis. This can be related to patient positioning, muscle spasm or soft tissue injury. Electronically Signed   By: Kennith Center M.D.   On: 10/27/2016 17:20   Ct Cervical Spine Wo Contrast  Result Date: 10/27/2016 CLINICAL DATA:  Unwitnessed fall. EXAM: CT HEAD WITHOUT CONTRAST CT CERVICAL SPINE WITHOUT CONTRAST TECHNIQUE: Multidetector CT imaging of the head and cervical spine was performed following the standard protocol without intravenous contrast. Multiplanar CT image reconstructions of the cervical spine were also generated. COMPARISON:  08/10/2016 FINDINGS: CT HEAD FINDINGS Brain: There is no evidence for acute hemorrhage, hydrocephalus, mass lesion, or abnormal extra-axial fluid collection. No definite CT evidence for acute infarction. Diffuse loss of parenchymal volume is consistent with atrophy. Patchy low attenuation in the deep hemispheric and periventricular white matter is nonspecific, but likely reflects chronic microvascular ischemic demyelination. Vascular: No hyperdense vessel or unexpected calcification. Skull: No evidence for fracture. No worrisome lytic or sclerotic lesion. Sinuses/Orbits: The visualized paranasal sinuses and mastoid air cells are clear. Visualized portions of the globes and intraorbital fat are unremarkable. Other: Left frontotemporal scalp contusion noted. CT CERVICAL SPINE FINDINGS Alignment: No subluxation. Reversal of normal cervical lordosis is evident. Skull base and vertebrae: No acute  fracture. No primary bone lesion or focal pathologic process. Soft tissues and spinal canal: No prevertebral fluid or swelling. No visible canal hematoma. Disc levels:  Maintained. Upper chest: Negative. Other: None. IMPRESSION: 1. No acute intracranial abnormality. Atrophy with chronic small vessel white matter ischemic disease. 2. No acute cervical spine fracture. 3. Loss of cervical lordosis. This can be related to patient positioning, muscle spasm or soft tissue injury. Electronically Signed   By: Kennith Center M.D.   On: 10/27/2016 17:20    Micro Results    Recent Results (from the past 240 hour(s))  Urine culture     Status: Abnormal   Collection Time: 10/27/16  4:57 PM  Result Value Ref Range Status   Specimen Description URINE, CATHETERIZED  Final   Special Requests NONE  Final   Culture >=100,000 COLONIES/mL ENTEROCOCCUS FAECALIS (A)  Final   Report Status 10/29/2016 FINAL  Final   Organism ID, Bacteria ENTEROCOCCUS FAECALIS (A)  Final      Susceptibility   Enterococcus faecalis - MIC*    AMPICILLIN <=2 SENSITIVE Sensitive     LEVOFLOXACIN 1 SENSITIVE Sensitive     NITROFURANTOIN <=16 SENSITIVE Sensitive     VANCOMYCIN 1 SENSITIVE Sensitive     * >=100,000 COLONIES/mL ENTEROCOCCUS FAECALIS  MRSA PCR Screening     Status: None   Collection Time: 10/28/16  6:37 AM  Result Value Ref Range Status   MRSA by PCR NEGATIVE NEGATIVE Final    Comment:        The GeneXpert MRSA Assay (FDA approved for NASAL specimens only), is one component of a comprehensive MRSA colonization surveillance program. It is not intended to diagnose MRSA infection nor to guide or monitor treatment for MRSA infections.        Today   Subjective:   Alexandria Lowe today is noncommunicative which is her baseline,  able to provide any complaints, no significant events overnight per nursing staff.  Objective:   Blood pressure 115/61, pulse 62, temperature 98 F (36.7 C), temperature source Oral,  resp. rate 18, height 5\' 2"  (1.575 m), weight 56 kg (123 lb 7.3 oz), SpO2 95 %.   Intake/Output Summary (Last 24 hours) at 11/01/16 1506 Last data filed at 11/01/16 1428  Gross per 24 hour  Intake              420 ml  Output              300 ml  Net              120 ml    Exam  Awake, nonverbal,, comfortable Left forehead bruise Supple Neck,No JVD,  Symmetrical Chest wall movement, Good air movement bilaterally, CTAB RRR,No Gallops,Rubs or new Murmurs, No Parasternal Heave +ve B.Sounds, Abd Soft, Non tender, No rebound -guarding or rigidity. No Cyanosis, Clubbing or edema, No new Rash or bruise Impaired judgment and insight, flat mood and affect Data Review   CBC w Diff:  Lab Results  Component Value Date   WBC 6.5 10/29/2016   HGB 10.1 (L) 10/29/2016   HCT 31.1 (L) 10/29/2016   PLT 113 (L) 10/29/2016   LYMPHOPCT 19 10/28/2016   MONOPCT 9 10/28/2016   EOSPCT 0 10/28/2016   BASOPCT 0 10/28/2016    CMP:  Lab Results  Component Value Date   NA 138 10/29/2016   K 3.6 10/29/2016   CL 110 10/29/2016   CO2 22 10/29/2016   BUN 9 10/29/2016   CREATININE 0.82 10/29/2016   PROT 6.6 10/27/2016   ALBUMIN 3.1 (L) 10/27/2016   BILITOT 0.6 10/27/2016   ALKPHOS 45 10/27/2016   AST 43 (H) 10/27/2016   ALT 34 10/27/2016  .   Randol Kern, Anjelique Makar M.D on 11/01/2016 at 3:06 PM  [facility]   Office  [phone]

## 2016-11-01 NOTE — [place] FL2 (Signed)
[place] MEDICAID FL2 LEVEL OF CARE SCREENING TOOL     IDENTIFICATION  Patient Name: Alexandria Lowe Birthdate: 05/01/1956 Sex: female Admission Date (Current Location): 10/27/2016  [place]  [place] [id number] S Facility and Address:  [facility], [address], [place], [place] [address]      Provider Number: [id number]  Attending Physician Name and Address:  Elgergawy, Leana Roeawood S, MD  Relative Name and Phone Number:  Desma MaximChristopher Torrens - son; [phone] (cell), [phone] (home)    Current Level of Care: Hospital Recommended Level of Care: Assisted Living Facility [facility]. Prior Approval Number:    Date Approved/Denied:   PASRR Number: [id number] Dolores Hoose(Eff. 02/04/14)  Discharge Plan: Other (Comment) ([facility]    Current Diagnoses: Patient Active Problem List   Diagnosis Date Noted  . CKD (chronic kidney disease), stage III 10/30/2016  . H/O tonic-clonic seizures 10/30/2016  . Malnutrition of moderate degree 10/29/2016  . AKI (acute kidney injury) (HCC)   . Thrombocytopenia (HCC) 10/27/2016  . Anemia 11/05/2015  . Essential hypertension 11/05/2015  . Hyperlipidemia 11/05/2015  . "walking corpse" syndrome   . Alzheimer's disease 06/17/2014  . Dementia     Orientation RESPIRATION BLADDER Height & Weight      (Disoriented X4)  Normal Incontinent, External catheter Weight: 123 lb 7.3 oz (56 kg) Height:  5\' 2"  (157.5 cm)  BEHAVIORAL SYMPTOMS/MOOD NEUROLOGICAL BOWEL NUTRITION STATUS    Convulsions/Seizures (History of seizures) Continent Diet - Finger foods  AMBULATORY STATUS COMMUNICATION OF NEEDS Skin   Total Care (Patient did not ambulate with therapist) Verbally (Patient speaks very slowly) Skin abrasions, Other (Comment) (Abrasion left arm and Ecchymosis left side of head)                       Personal Care Assistance Level of Assistance  Bathing, Feeding,  Dressing Bathing Assistance: Maximum assistance Feeding assistance: Limited assistance Dressing Assistance: Maximum assistance     Functional Limitations Info  Sight, Hearing, Speech Sight Info: Adequate Hearing Info: Adequate Speech Info: Adequate (Patient talks per ALF, but very slow to respond)    SPECIAL CARE FACTORS FREQUENCY  PT (By licensed PT), OT (By licensed OT)     PT Frequency: Evaluated 6/4 and a minimum of 2X per week therapy recommended OT Frequency: Evaluated 6/5            Contractures Contractures Info: Not present    Additional Factors Info  Code Status, Allergies Code Status Info: Full Allergies Info: No known allergies           Current Medications (11/01/2016):  This is the current hospital active medication list Current Facility-Administered Medications  Medication Dose Route Frequency Provider Last Rate Last Dose  . 0.9 %  sodium chloride infusion   Intravenous Continuous Rama, Maryruth Bunhristina P, MD 10 mL/hr at 10/29/16 1004    . acetaminophen (TYLENOL) tablet 650 mg  650 mg Oral Q6H PRN Opyd, Lavone Neriimothy S, MD       Or  . acetaminophen (TYLENOL) suppository 650 mg  650 mg Rectal Q6H PRN Opyd, Lavone Neriimothy S, MD      . alum & mag hydroxide-simeth (MAALOX/MYLANTA) 200-200-20 MG/5ML suspension 30 mL  30 mL Oral PRN Opyd, Lavone Neriimothy S, MD      . amoxicillin (AMOXIL) capsule 500 mg  500 mg Oral Q12H Elgergawy, Leana Roeawood S, MD   500 mg at 11/01/16 0950  . brimonidine (  ALPHAGAN) 0.15 % ophthalmic solution 1 drop  1 drop Both Eyes BID Opyd, Lavone Neri, MD   1 drop at 10/31/16 2307  . brinzolamide (AZOPT) 1 % ophthalmic suspension 1 drop  1 drop Both Eyes BID Opyd, Lavone Neri, MD   1 drop at 10/31/16 2309  . calcium-vitamin D (OSCAL WITH D) 500-200 MG-UNIT per tablet 1 tablet  1 tablet Oral Q breakfast Rama, Maryruth Bun, MD   1 tablet at 11/01/16 0950  . enoxaparin (LOVENOX) injection 40 mg  40 mg Subcutaneous Q24H Opyd, Lavone Neri, MD   40 mg at 10/31/16 2305  . feeding  supplement (ENSURE ENLIVE) (ENSURE ENLIVE) liquid 237 mL  237 mL Oral BID BM Rama, Maryruth Bun, MD   237 mL at 11/01/16 0948  . fluticasone (FLONASE) 50 MCG/ACT nasal spray 2 spray  2 spray Each Nare Daily Opyd, Lavone Neri, MD   2 spray at 10/31/16 0959  . hydrALAZINE (APRESOLINE) injection 10 mg  10 mg Intravenous Q4H PRN Opyd, Lavone Neri, MD      . hydrocerin (EUCERIN) cream   Topical QHS Opyd, Lavone Neri, MD      . lisinopril (PRINIVIL,ZESTRIL) tablet 20 mg  20 mg Oral Q breakfast Rama, Maryruth Bun, MD   20 mg at 10/31/16 0948   Or  . hydrochlorothiazide (MICROZIDE) capsule 12.5 mg  12.5 mg Oral Q breakfast Rama, Maryruth Bun, MD   12.5 mg at 11/01/16 0950  . latanoprost (XALATAN) 0.005 % ophthalmic solution 1 drop  1 drop Both Eyes QHS Opyd, Lavone Neri, MD   1 drop at 10/31/16 2308  . levETIRAcetam (KEPPRA) 100 MG/ML solution 750 mg  750 mg Oral BID Rama, Maryruth Bun, MD   750 mg at 11/01/16 0950  . lidocaine (LIDODERM) 5 % 1 patch  1 patch Transdermal Q24H Opyd, Lavone Neri, MD   1 patch at 10/31/16 2311  . loratadine (CLARITIN) tablet 10 mg  10 mg Oral Q breakfast Rama, Maryruth Bun, MD   10 mg at 11/01/16 0950  . neomycin-bacitracin-polymyxin (NEOSPORIN) ointment 1 application  1 application Topical PRN Opyd, Lavone Neri, MD      . ondansetron (ZOFRAN) injection 4 mg  4 mg Intravenous Q6H PRN Opyd, Lavone Neri, MD      . sertraline (ZOLOFT) tablet 100 mg  100 mg Oral Daily Rama, Maryruth Bun, MD   100 mg at 11/01/16 0950  . Valproate Sodium (DEPAKENE) solution 625 mg  625 mg Oral BID Briscoe Deutscher, MD   625 mg at 11/01/16 3244     Discharge Medications: Please see discharge summary for a list of discharge medications.  Relevant Imaging Results:  Relevant Lab Results:   Additional Information [id number]. Discharge medications below:  STOP taking these medications   LORazepam 0.5 MG tablet Commonly known as:  ATIVAN STOP taking these medications   ASPERCREME LIDOCAINE 4 % Ptch Generic  drug:  Lidocaine        TAKE these medications   acetaminophen 500 MG tablet Commonly known as:  TYLENOL Take 500 mg by mouth every 4 (four) hours as needed for mild pain, moderate pain, fever or headache.   amoxicillin 500 MG capsule Commonly known as:  AMOXIL Take 1 capsule (500 mg total) by mouth every 12 (twelve) hours.     brimonidine 0.1 % Soln Commonly known as:  ALPHAGAN P Place 1 drop into both eyes 2 (two) times daily.   brinzolamide 1 % ophthalmic suspension Commonly known as:  AZOPT  Place 1 drop into both eyes 2 (two) times daily.   Calcium-D 600-400 MG-UNIT Tabs Take 1 tablet by mouth daily with breakfast.   guaiFENesin 100 MG/5ML liquid Commonly known as:  ROBITUSSIN Take 200 mg by mouth every 6 (six) hours as needed for cough.   levETIRAcetam 100 MG/ML solution Commonly known as:  KEPPRA Take 750 mg by mouth 2 (two) times daily.   lisinopril-hydrochlorothiazide 20-12.5 MG tablet Commonly known as:  PRINZIDE,ZESTORETIC Take 1 tablet by mouth daily with breakfast.   loperamide 2 MG capsule Commonly known as:  IMODIUM Take 2 mg by mouth as needed for diarrhea or loose stools.   loratadine 10 MG tablet Commonly known as:  CLARITIN Take 10 mg by mouth daily with breakfast.   magnesium hydroxide 400 MG/5ML suspension Commonly known as:  MILK OF MAGNESIA Take 30 mLs by mouth at bedtime as needed for mild constipation.   meloxicam 15 MG tablet Commonly known as:  MOBIC Take 15 mg by mouth daily.   MINERIN Crea Apply 1 application topically at bedtime. Applies to whole body   MINTOX 200-200-20 MG/5ML suspension Generic drug:  alum & mag hydroxide-simeth Take 30 mLs by mouth as needed for indigestion or heartburn.   mirtazapine 30 MG tablet Commonly known as:  REMERON Take 30 mg by mouth at bedtime.   mometasone 50 MCG/ACT nasal spray Commonly known as:  NASONEX Place 2 sprays into the nose daily after breakfast.    neomycin-bacitracin-polymyxin [phone] ointment Apply 1 application topically as needed (for wound care).   NUTRITIONAL SHAKE PO Take 1 Can by mouth 3 (three) times daily. Mighty Shakes   sertraline 100 MG tablet Commonly known as:  ZOLOFT Take 100 mg by mouth daily.   TRAVATAN Z 0.004 % Soln ophthalmic solution Generic drug:  Travoprost (BAK Free) Place 1 drop into both eyes at bedtime.   Valproic Acid 250 MG/5ML Soln Take 12.5 mLs by mouth 2 (two) times daily.         Cristobal Goldmann, LCSW

## 2016-11-01 NOTE — Progress Notes (Signed)
Patient was discharged [facility], report called to RN. IV discontinued. Family notified of transfer and AVS reviewed. Patients belongings sent with patient via transport

## 2016-11-01 NOTE — Clinical Social Work Placement (Signed)
   CLINICAL SOCIAL WORK PLACEMENT  NOTE 11/01/16 - DISCHARGE BACK [facility], TRANSPORTED BY [facility]  Date:  11/01/2016  Patient Details  Name: Alexandria Lowe MRN: [id number] Date of Birth: 03/03/1956  Clinical Social Work is seeking post-discharge placement for this patient at the Skilled  Nursing Facility level of care (*CSW will initial, date and re-position this form in  chart as items are completed):  Yes   Patient/family provided with [place] Clinical Social Work Department's list of facilities offering this level of care within the geographic area requested by the patient (or if unable, by the patient's family).  Yes   Patient/family informed of their freedom to choose among providers that offer the needed level of care, that participate in Medicare, Medicaid or managed care program needed by the patient, have an available bed and are willing to accept the patient.  Yes   Patient/family informed of 's ownership interest in [facility] and [facility], as well as of the fact that they are under no obligation to receive care at these facilities.  PASRR submitted to EDS on 10/30/16     PASRR number received on 10/30/16 ([id number] A - eff. 10/30/16)     Existing PASRR number confirmed on       FL2 transmitted to all facilities in geographic area requested by pt/family on 10/30/16     FL2 transmitted to all facilities within larger geographic area on       Patient informed that his/her managed care company has contracts with or will negotiate with certain facilities, including the following:        Yes   Patient/family informed of bed offers received.  [facility]     Physician recommends and patient chooses bed at      Patient to be [facility] on  11/01/16. See additional comments below.  Patient to be transferred to ALF facility by  ambulance     Patient family notified on  11/01/16 of  transfer.  Name of family member notified:   Desma MaximChristopher Mcneel, son [phone])     PHYSICIAN      Additional Comment:  Original d/c plan was to SNF for rehab before [facility], however SNF placement denied by insurance. Son, Cristal DeerChristopher informed and in agreement for patient to return to ALF.   _______________________________________________ Cristobal Goldmannrawford, Edmund Holcomb Bradley, LCSW 11/01/2016, 5:31 PM

## 2017-03-11 ENCOUNTER — Telehealth: Payer: Self-pay

## 2017-03-11 NOTE — Telephone Encounter (Signed)
Patient's friend Aram Beecham called to see if this patient really needs to keep coming in to see Dr. Anne Hahn

## 2017-03-11 NOTE — Telephone Encounter (Signed)
I called and talked with the daughter. The patient is now on hospice, she has end stage dementia We were following her mainly for seizures.  She is not getting any medications to this office. Okay to cancel the appointment, we can see her on an as-needed basis.

## 2017-03-11 NOTE — Telephone Encounter (Signed)
Previous message closed in error. Aram Beecham would like to know if patient needs to continue to come in to see Dr. Anne Hahn? Patient currently resides at [facility], wheelchair bound. Aram Beecham can be reached at [phone]

## 2017-03-12 ENCOUNTER — Ambulatory Visit: Payer: Medicare HMO | Admitting: Neurology

## 2017-03-12 ENCOUNTER — Ambulatory Visit: Payer: Medicare HMO | Admitting: Adult Health

## 2017-06-17 IMAGING — CT CT HEAD W/O CM
5 of 6 series · 17 of 47 positions shown, 18 images · non-contrast
Comparison: June 01, 2015

CLINICAL DATA: Seizure.  History of seizure Alzheimer's.

EXAM:
CT HEAD WITHOUT CONTRAST
TECHNIQUE: Contiguous axial images were obtained from the base of the skull
through the vertex without intravenous contrast.

[Series 2: head without · axial · non-contrast · 0.46mm/px · z∈[+1254,+1299]mm · 2 of 29 slices shown, 3 images (1 of 2)]
[im 10/29  brain]
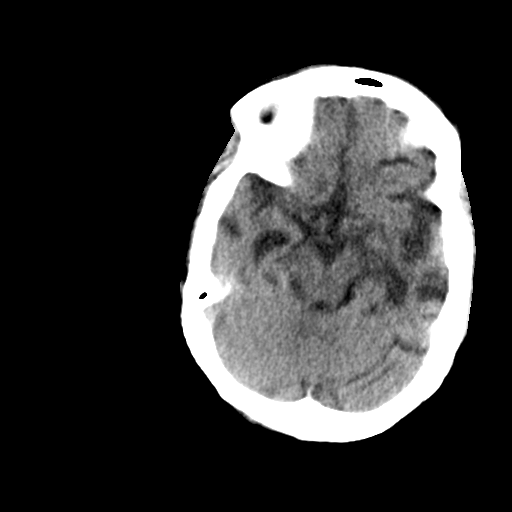
[im 10/29  bone]
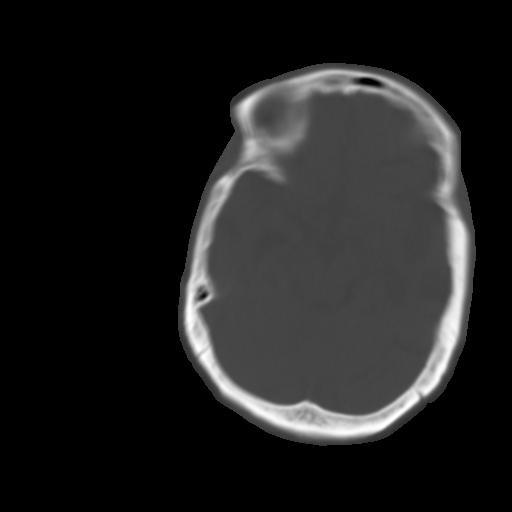
[im 19/29  brain]
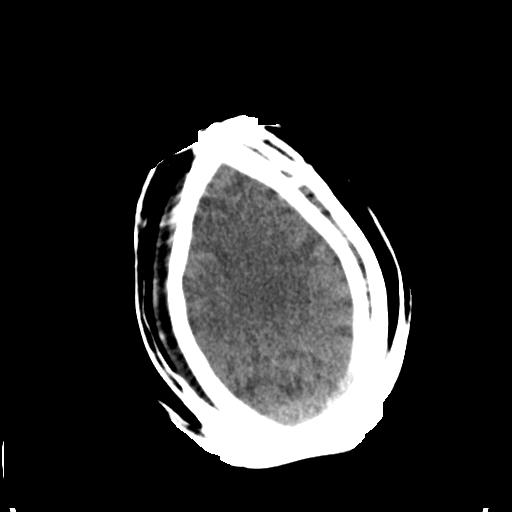

[Series 3: head bone · axial · 0.46mm/px · z∈[+1223,+1321]mm · 7 of 72 slices shown]
[im 8/72  bone]
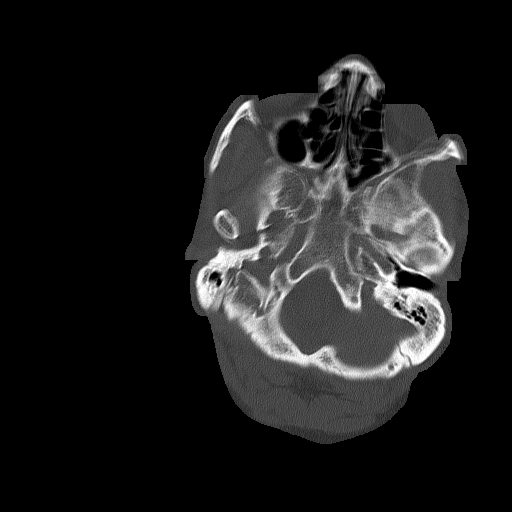
[im 15/72  bone]
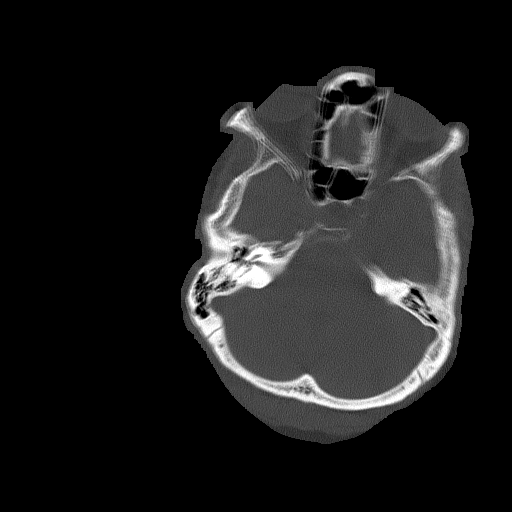
[im 22/72  bone]
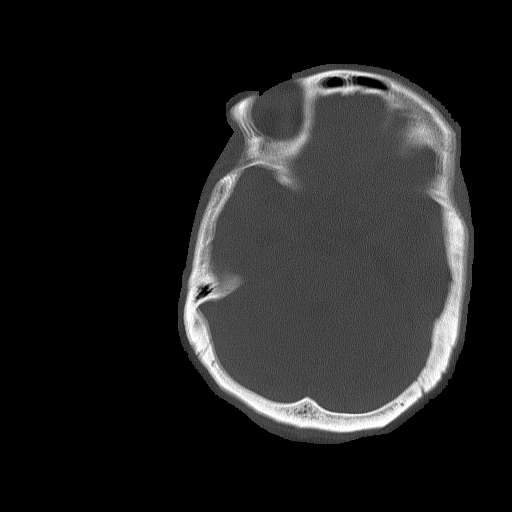
[im 29/72  bone]
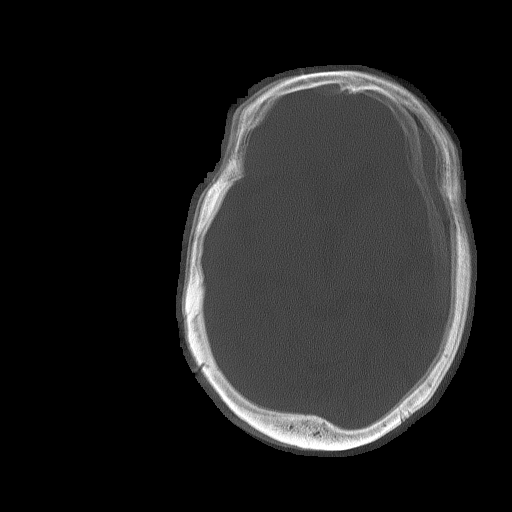
[im 43/72  bone]
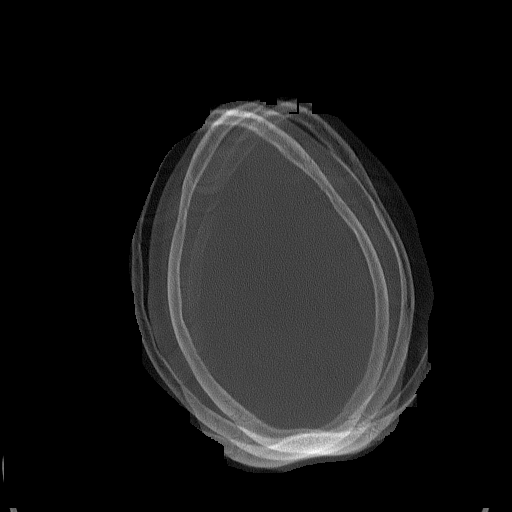
[im 50/72  bone]
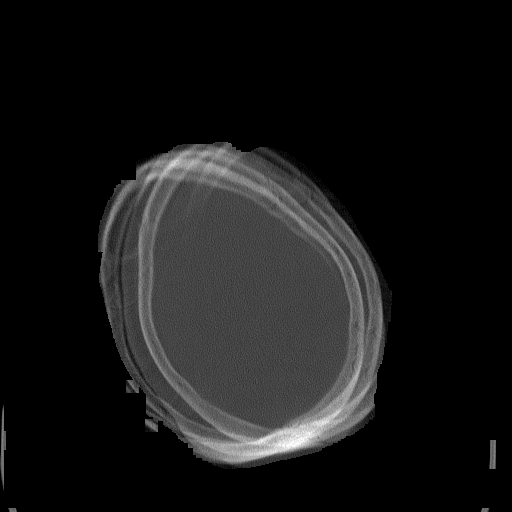
[im 57/72  bone]
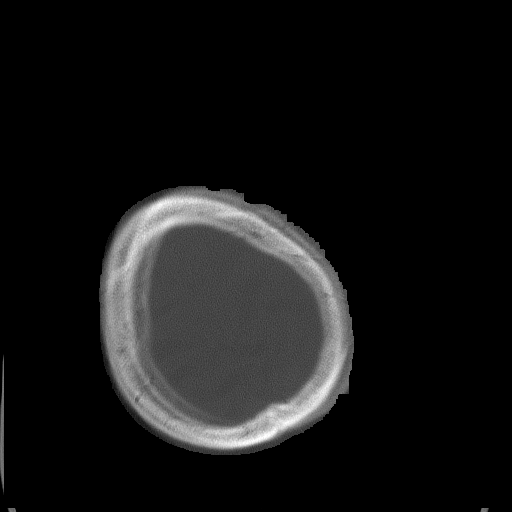

[Series 4: head without · axial · non-contrast · 0.46mm/px · z∈[+1254,+1299]mm · 2 of 29 slices shown (2 of 2)]
[im 10/29  brain]
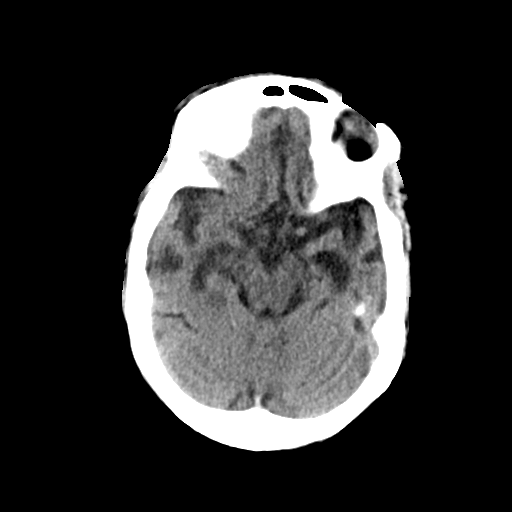
[im 19/29  brain]
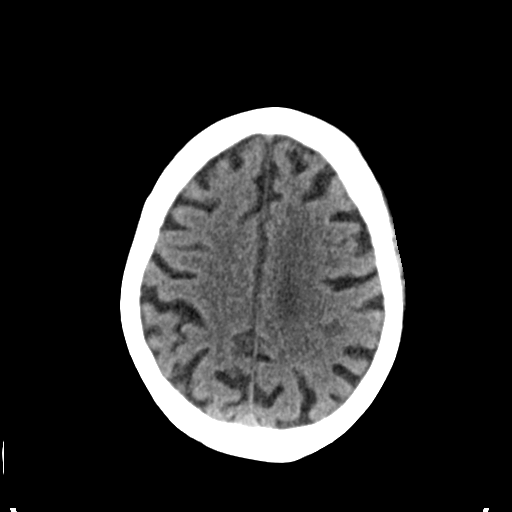

[Series 6: head without cor · coronal · non-contrast · 0.30mm/px · 3 of 56 slices shown]
[im 19/56  brain]
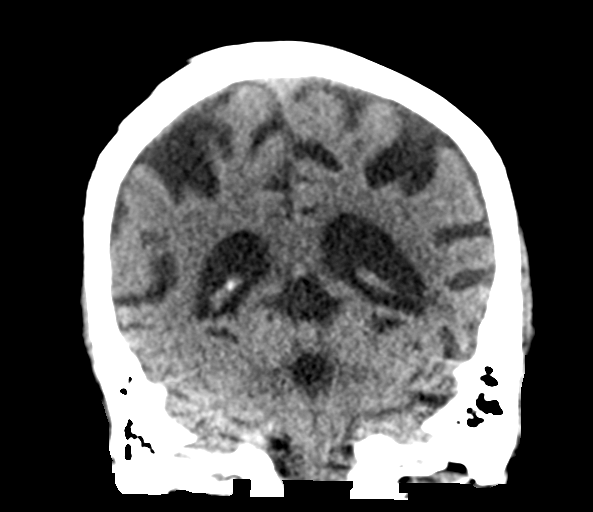
[im 25/56  brain]
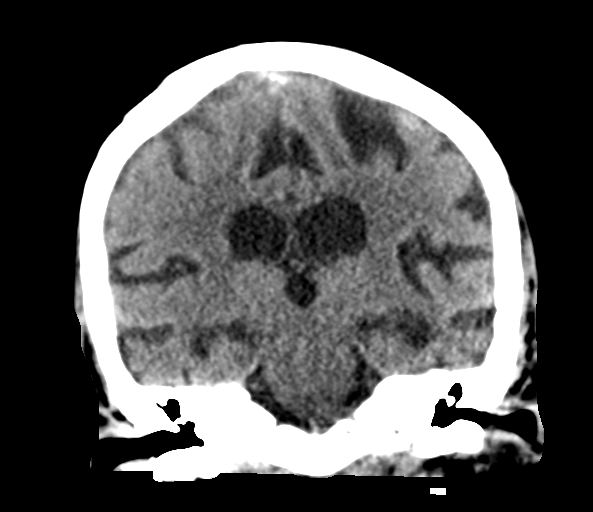
[im 31/56  brain]
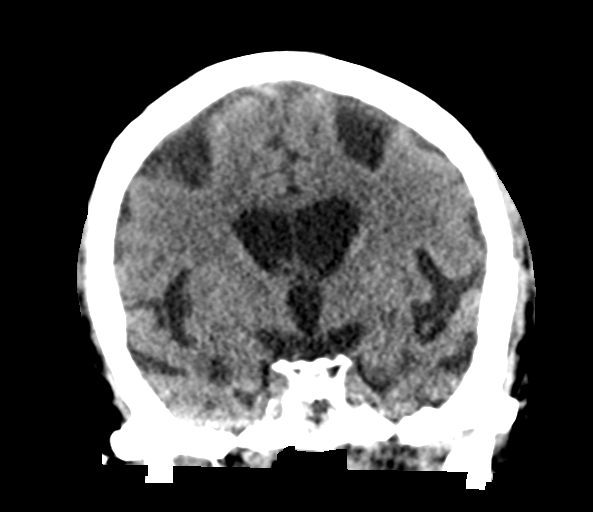

[Series 7: head without sag · sagittal · non-contrast · 0.30mm/px · 3 of 45 slices shown]
[im 15/45  brain]
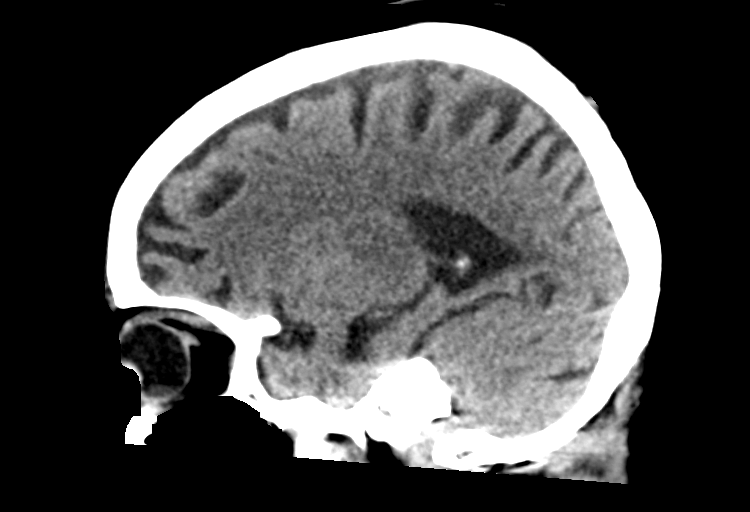
[im 23/45  brain]
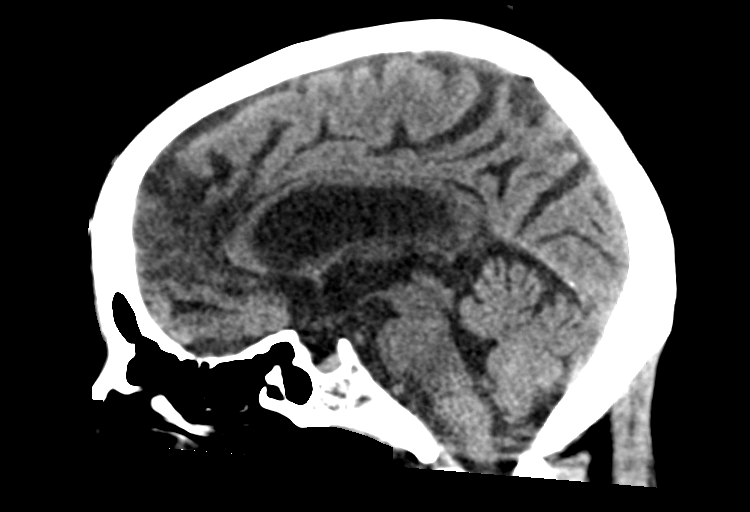
[im 30/45  brain]
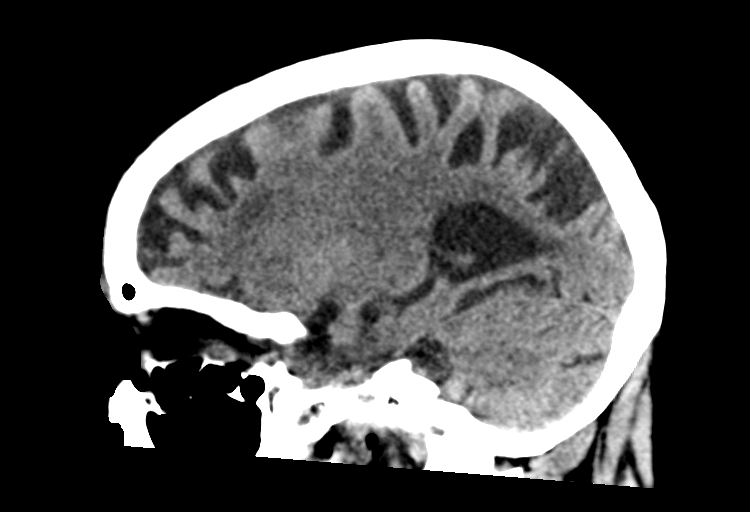

[17 of 47 positions shown; findings below may reference images not displayed]

FINDINGS: The patient was scanned twice due to motion. Paranasal sinuses,
mastoid air cells, and middle ears are unremarkable. Extracranial
soft tissues are unremarkable. No subdural, epidural, or
subarachnoid hemorrhage. Ventricles and sulci are prominent but
stable. No mass, mass effect, or midline shift. Scattered white
matter changes are identified. No acute cortical ischemia or
infarct. Cerebellum, brainstem, and basal cisterns are normal.
IMPRESSION: No acute abnormality.

## 2017-06-17 IMAGING — CR DG HUMERUS 2V *R*
2 series · 2 of 2 positions shown · non-contrast
Comparison: 06/01/2015

CLINICAL DATA: Pain and deformity.

EXAM:
RIGHT HUMERUS - 2+ VIEW

[AP]
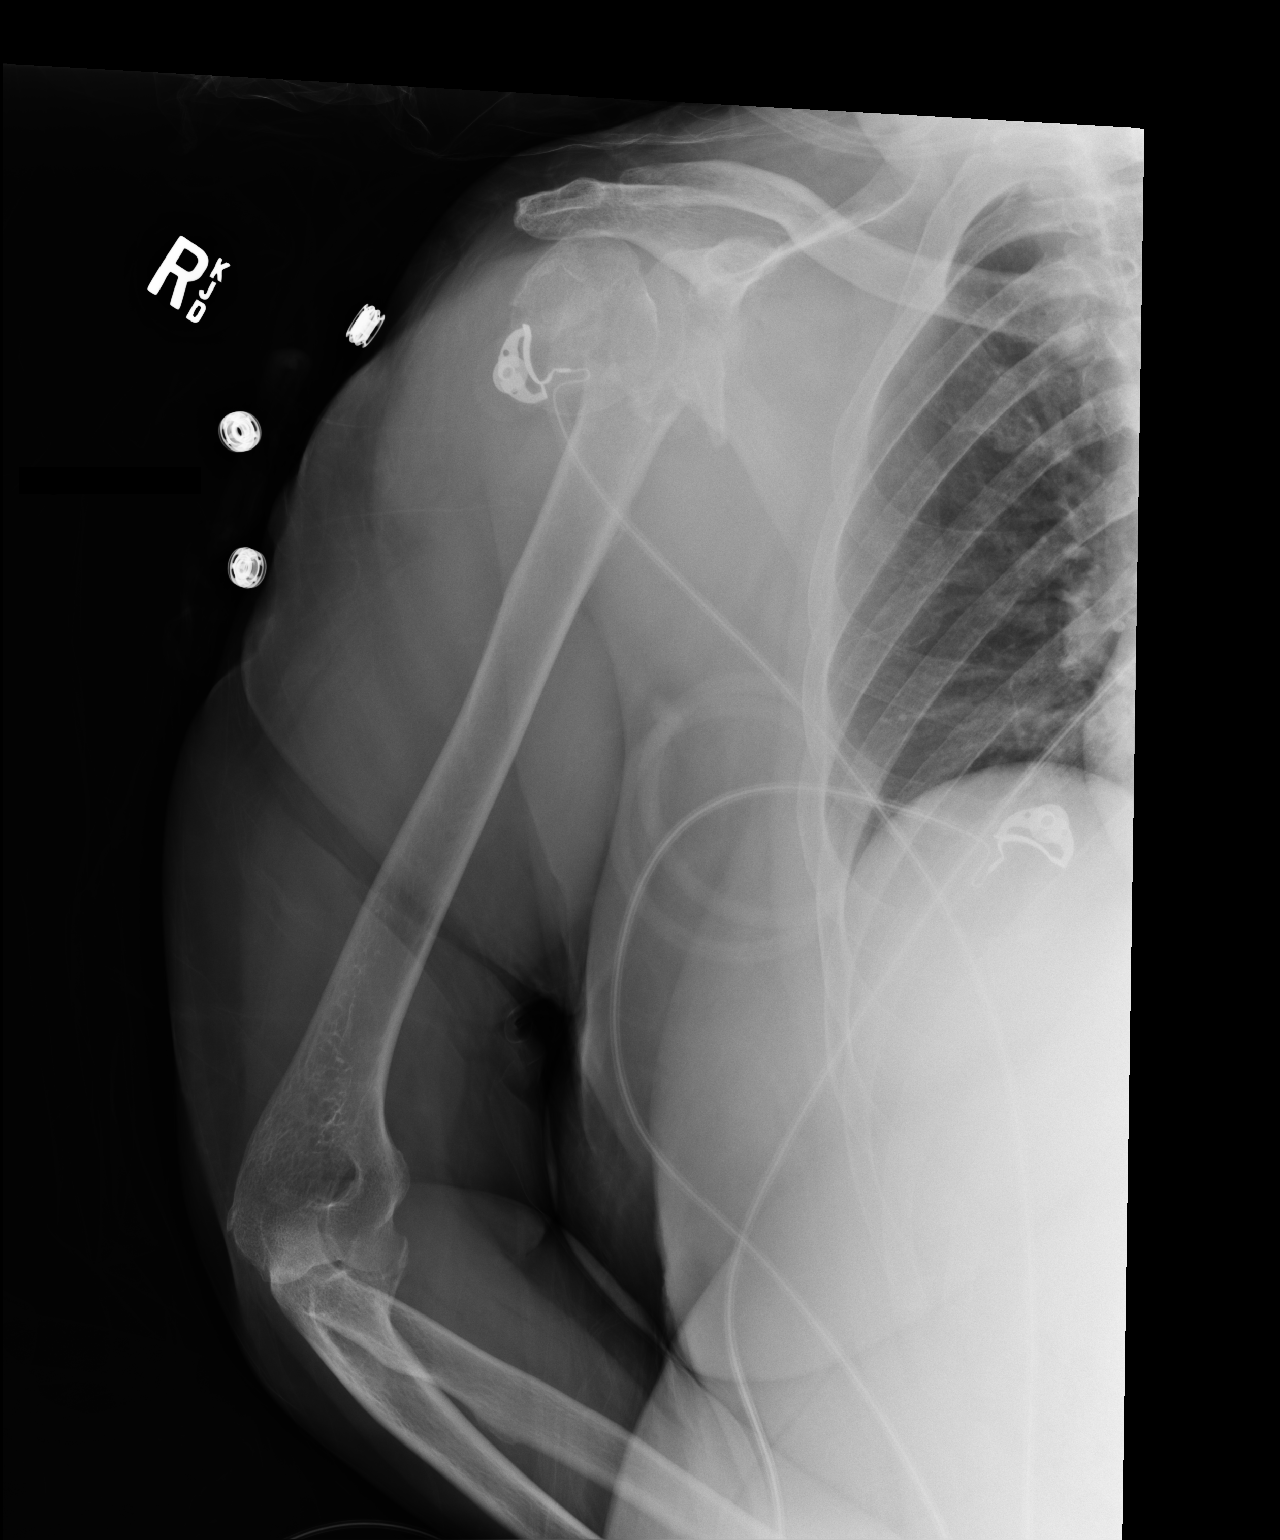

[lateral]
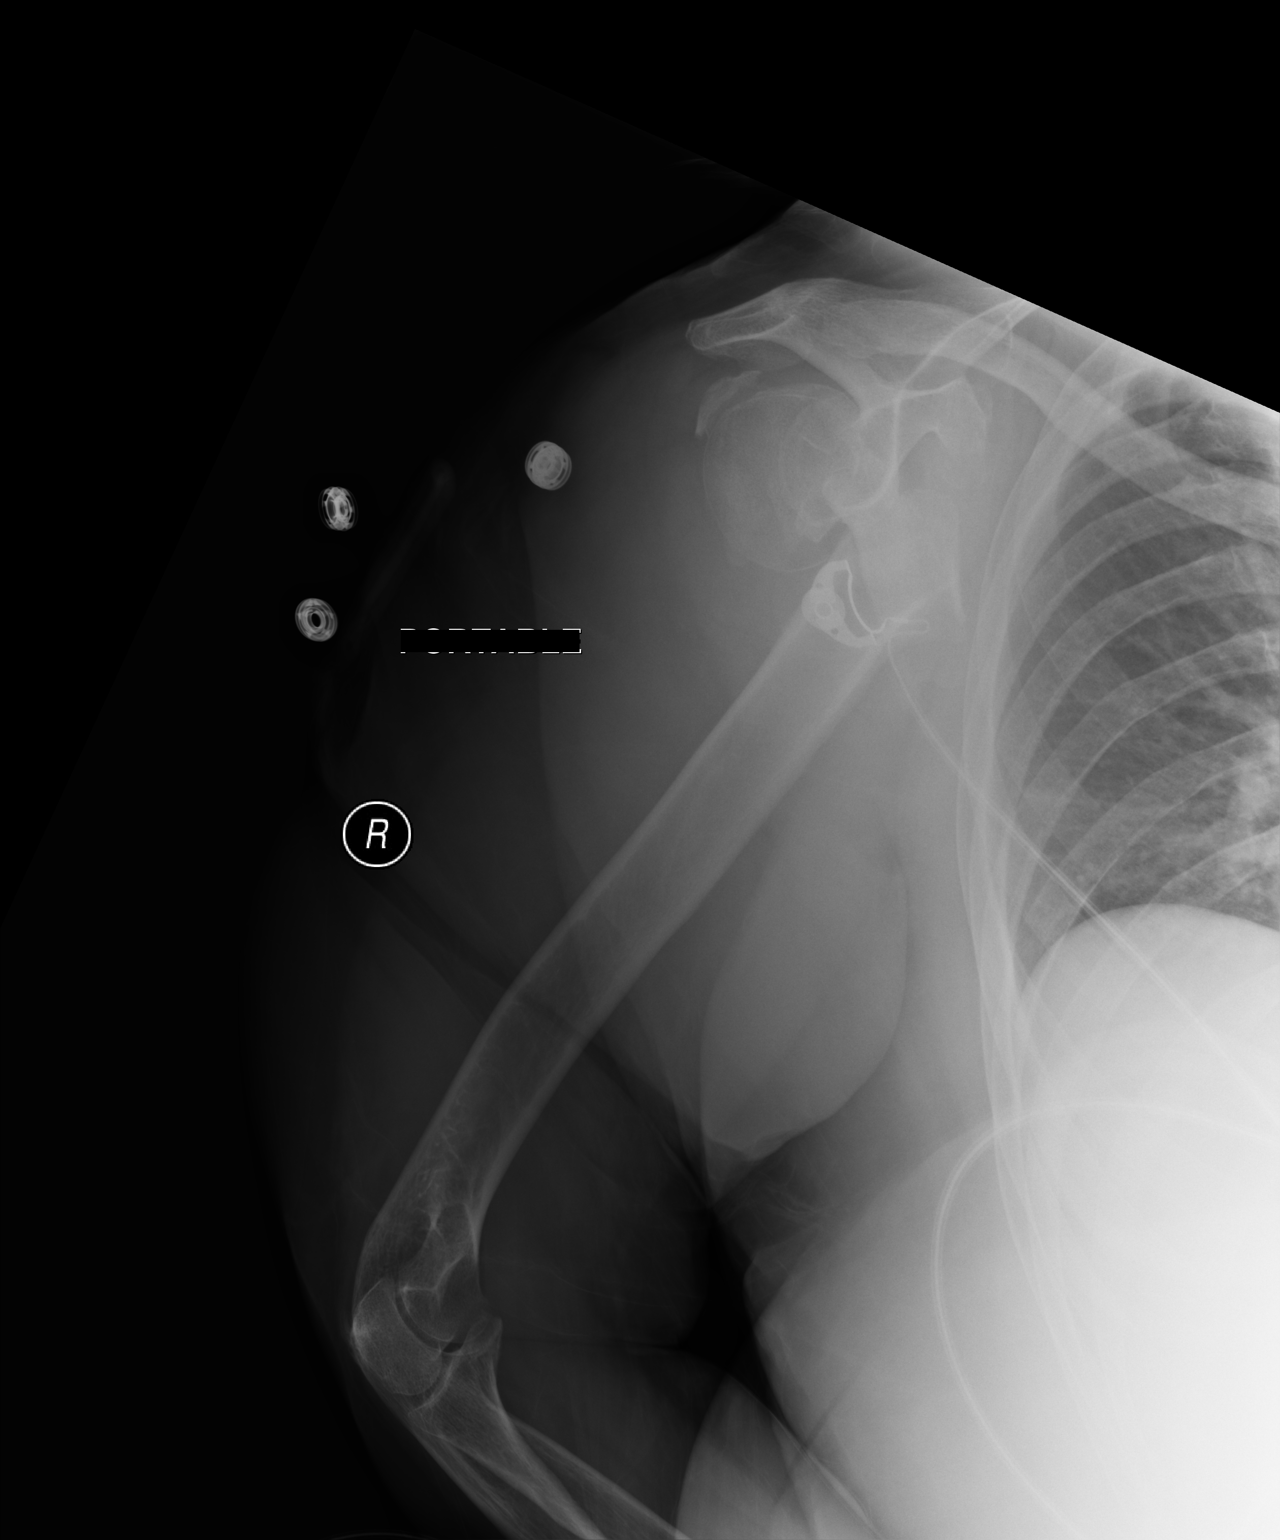

[2 of 2 positions shown; findings below may reference images not displayed]

FINDINGS: Complete fracture at the neck of the humerus displaced anteriorly by
the width of the bone. There may be an intra-articular fragment
adjacent to the humeral head.
IMPRESSION: Complete humeral neck fracture, displaced anteriorly.

## 2017-06-17 IMAGING — CR DG CHEST 2V
2 series · 2 of 2 positions shown · non-contrast
Comparison: Chest x-ray of June 01, 2015

CLINICAL DATA: Breakthrough seizure, evaluate for pneumonia ;
history of Alzheimer's disease.

EXAM:
CHEST  2 VIEW

[chest lat]
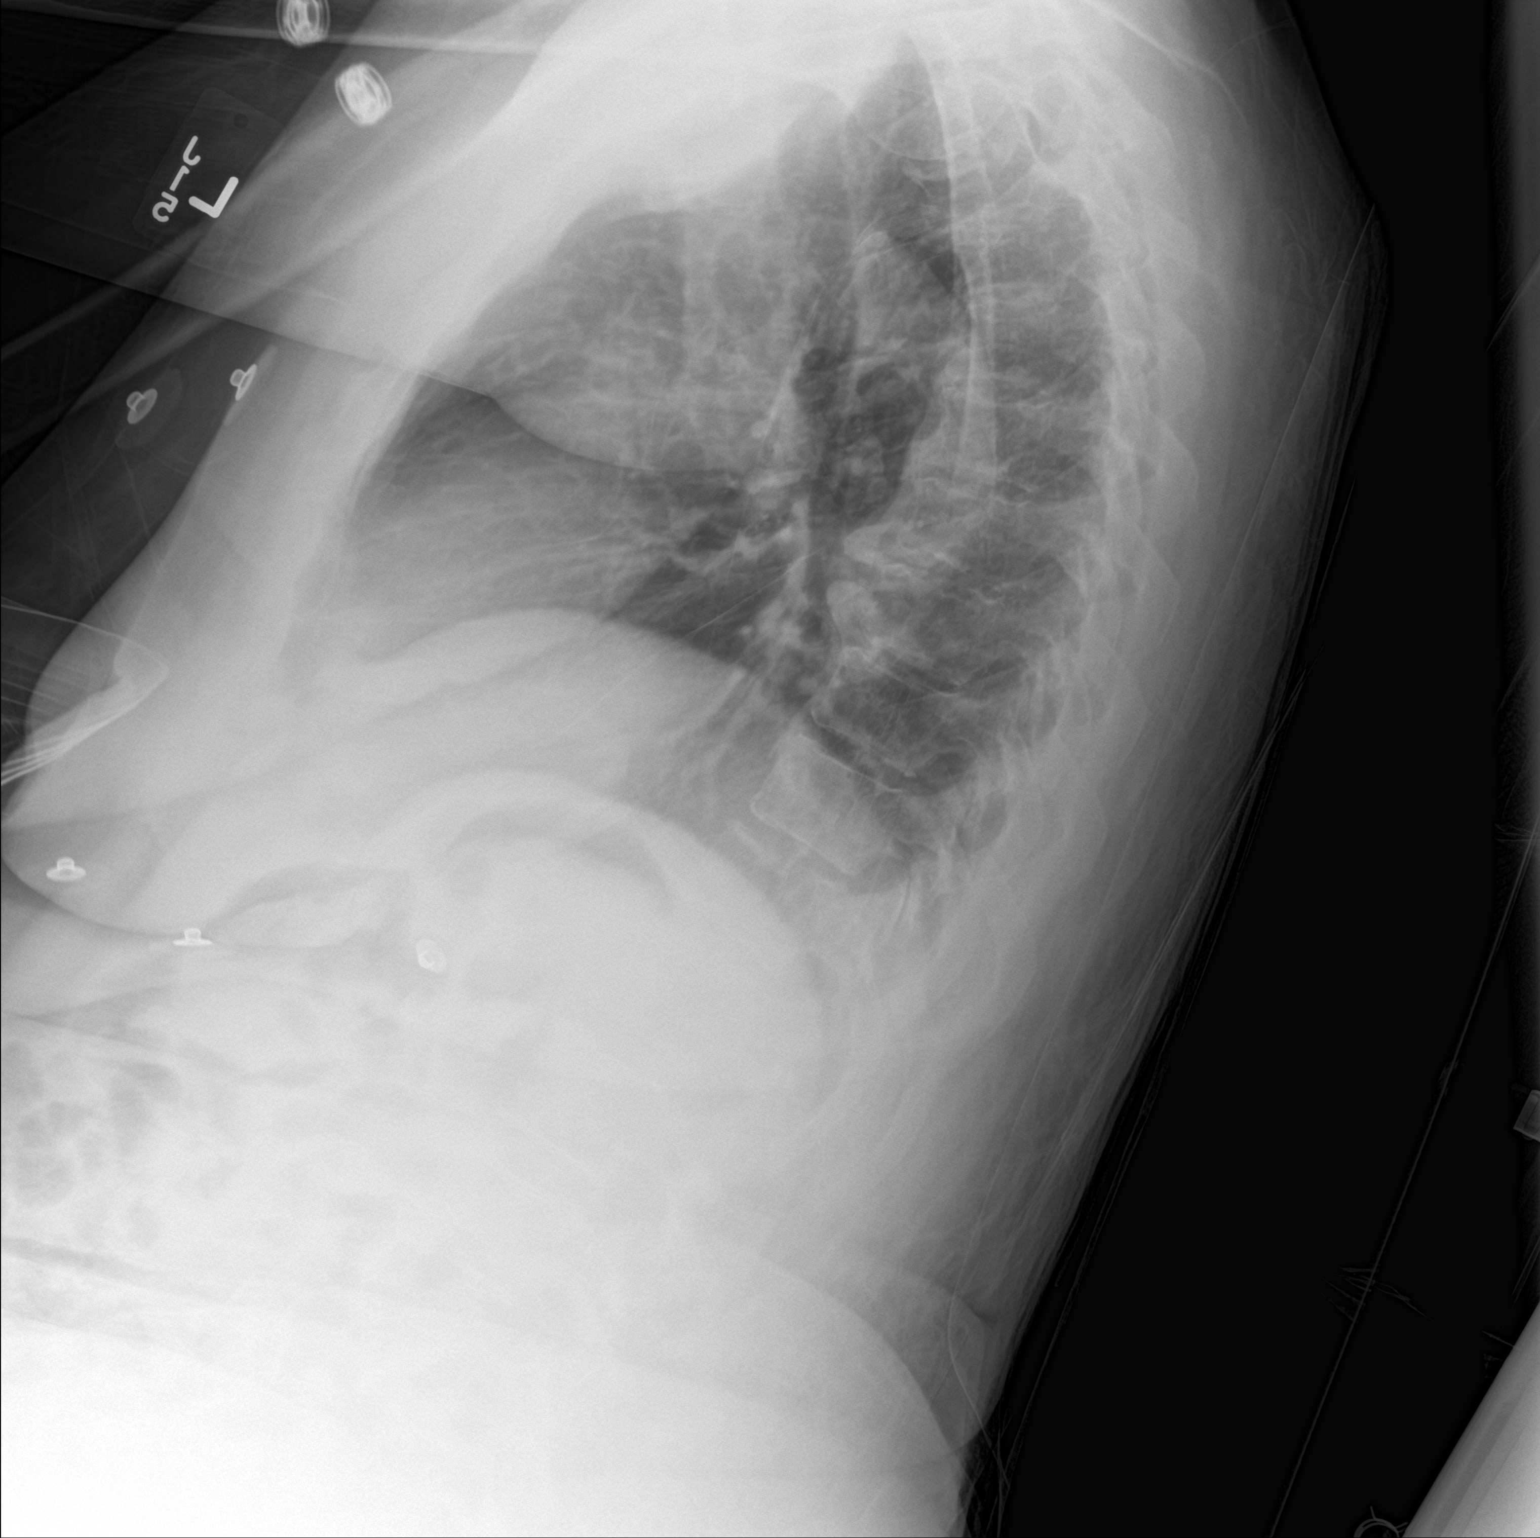

[chest ap]
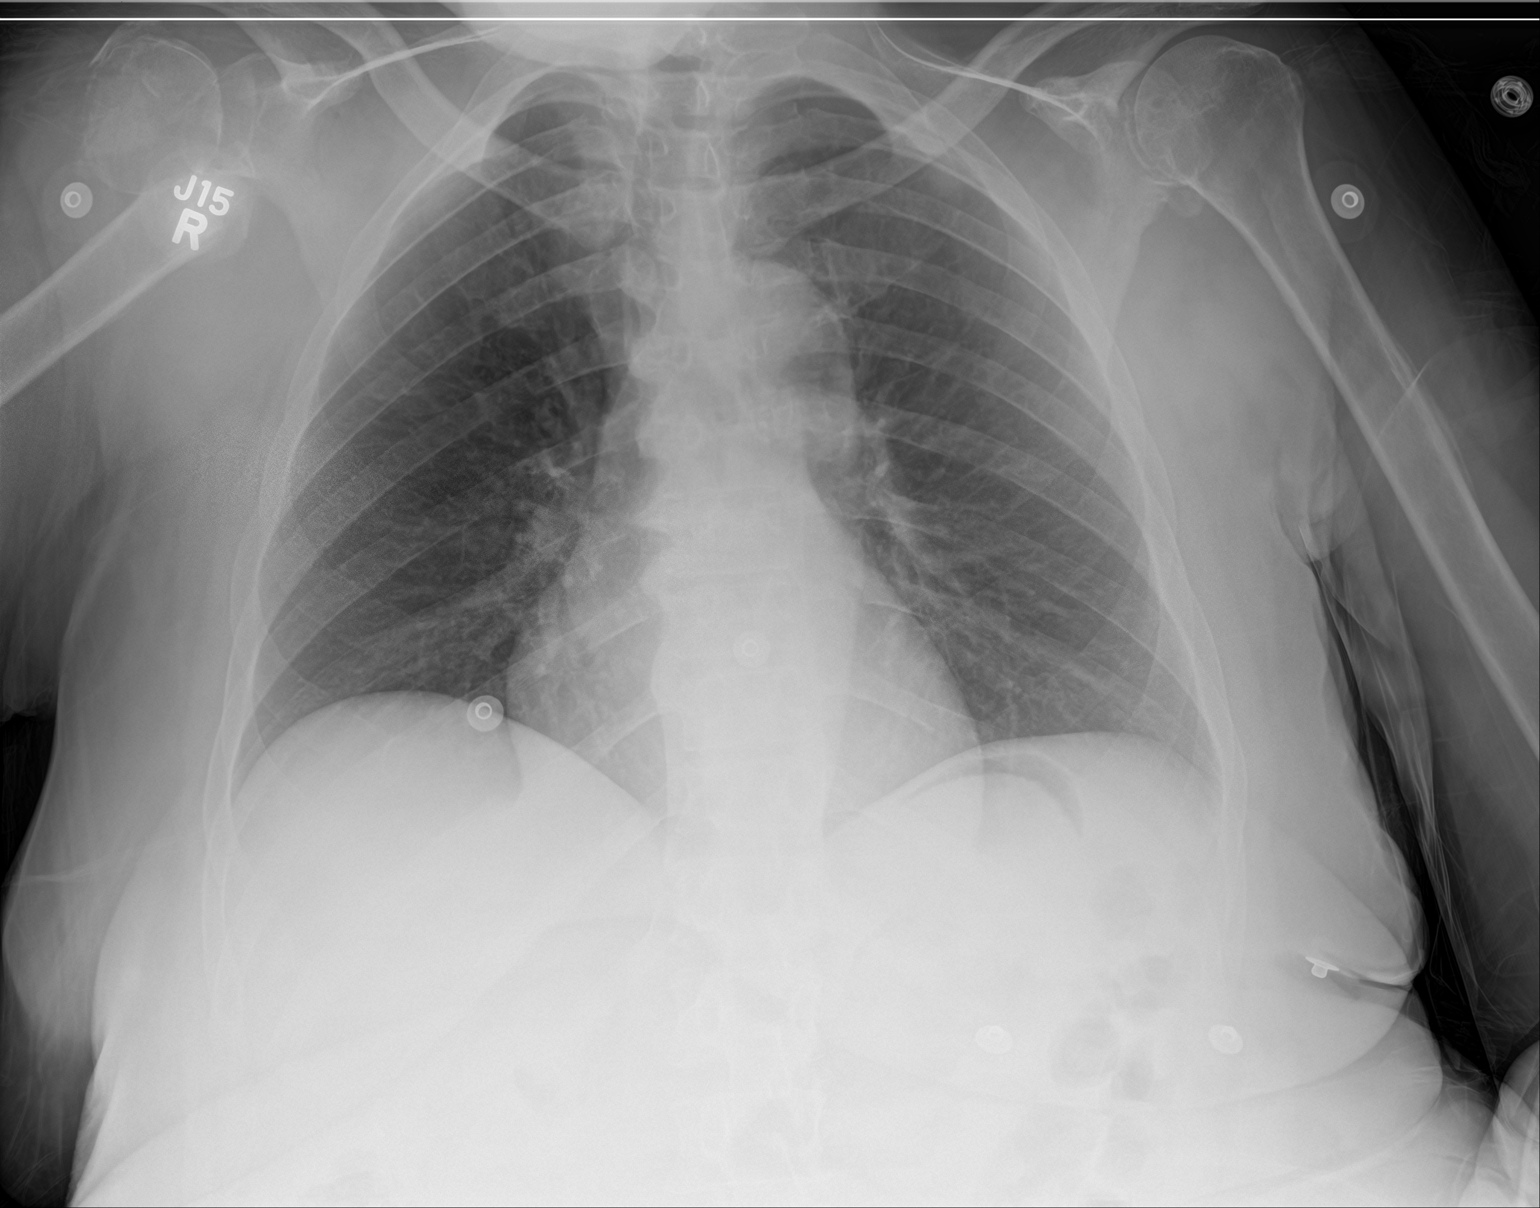

[2 of 2 positions shown; findings below may reference images not displayed]

FINDINGS: The lungs are well-expanded and clear. The heart and pulmonary
vascularity are normal. The mediastinum is normal in width. There is
no pleural effusion. There is multilevel degenerative disc disease
of the thoracic spine.

There is an acute displaced fracture of the neck of the right
humerus.
IMPRESSION: Acute displaced fracture of the neck of the right humerus. There is
no evidence of aspiration nor other acute cardiopulmonary
abnormality.

This report was called by me to to Dr Touhonen in the the [HOSPITAL]
[HOSPITAL] [DATE] p.m. on 04 February, 2016.

## 2018-12-08 ENCOUNTER — Emergency Department (HOSPITAL_COMMUNITY)

## 2018-12-08 ENCOUNTER — Encounter (HOSPITAL_COMMUNITY): Payer: Self-pay

## 2018-12-08 ENCOUNTER — Emergency Department (HOSPITAL_COMMUNITY)
Admission: EM | Admit: 2018-12-08 | Discharge: 2018-12-08 | Disposition: A | Discharge status: Home / Self Care | Attending: Emergency Medicine | Admitting: Emergency Medicine

## 2018-12-08 ENCOUNTER — Other Ambulatory Visit: Payer: Self-pay

## 2018-12-08 DIAGNOSIS — I129 Hypertensive chronic kidney disease with stage 1 through stage 4 chronic kidney disease, or unspecified chronic kidney disease: Secondary | ICD-10-CM | POA: Insufficient documentation

## 2018-12-08 DIAGNOSIS — N183 Chronic kidney disease, stage 3 (moderate): Secondary | ICD-10-CM | POA: Insufficient documentation

## 2018-12-08 DIAGNOSIS — G309 Alzheimer's disease, unspecified: Secondary | ICD-10-CM | POA: Diagnosis not present

## 2018-12-08 DIAGNOSIS — R569 Unspecified convulsions: Secondary | ICD-10-CM | POA: Diagnosis not present

## 2018-12-08 DIAGNOSIS — Z20828 Contact with and (suspected) exposure to other viral communicable diseases: Secondary | ICD-10-CM | POA: Diagnosis not present

## 2018-12-08 DIAGNOSIS — Z79899 Other long term (current) drug therapy: Secondary | ICD-10-CM | POA: Diagnosis not present

## 2018-12-08 DIAGNOSIS — F0280 Dementia in other diseases classified elsewhere without behavioral disturbance: Secondary | ICD-10-CM | POA: Diagnosis not present

## 2018-12-08 LAB — CBC WITH DIFFERENTIAL/PLATELET
Abs Immature Granulocytes: 0.02 10*3/uL (ref 0.00–0.07)
Basophils Absolute: 0 10*3/uL (ref 0.0–0.1)
Basophils Relative: 0 %
Eosinophils Absolute: 0.1 10*3/uL (ref 0.0–0.5)
Eosinophils Relative: 1 %
HCT: 37.8 % (ref 36.0–46.0)
Hemoglobin: 11.5 g/dL — ABNORMAL LOW (ref 12.0–15.0)
Immature Granulocytes: 0 %
Lymphocytes Relative: 24 %
Lymphs Abs: 2.6 10*3/uL (ref 0.7–4.0)
MCH: 26.4 pg (ref 26.0–34.0)
MCHC: 30.4 g/dL (ref 30.0–36.0)
MCV: 86.9 fL (ref 80.0–100.0)
Monocytes Absolute: 0.6 10*3/uL (ref 0.1–1.0)
Monocytes Relative: 6 %
Neutro Abs: 7.3 10*3/uL (ref 1.7–7.7)
Neutrophils Relative %: 69 %
Platelets: 267 10*3/uL (ref 150–400)
RBC: 4.35 MIL/uL (ref 3.87–5.11)
RDW: 15.9 % — ABNORMAL HIGH (ref 11.5–15.5)
WBC: 10.7 10*3/uL — ABNORMAL HIGH (ref 4.0–10.5)
nRBC: 0 % (ref 0.0–0.2)

## 2018-12-08 LAB — URINALYSIS, ROUTINE W REFLEX MICROSCOPIC
Bilirubin Urine: NEGATIVE
Glucose, UA: NEGATIVE mg/dL
Hgb urine dipstick: NEGATIVE
Ketones, ur: NEGATIVE mg/dL
Leukocytes,Ua: NEGATIVE
Nitrite: NEGATIVE
Protein, ur: NEGATIVE mg/dL
Specific Gravity, Urine: 1.005 (ref 1.005–1.030)
pH: 8 (ref 5.0–8.0)

## 2018-12-08 LAB — BASIC METABOLIC PANEL
Anion gap: 16 — ABNORMAL HIGH (ref 5–15)
BUN: 14 mg/dL (ref 8–23)
CO2: 19 mmol/L — ABNORMAL LOW (ref 22–32)
Calcium: 9.3 mg/dL (ref 8.9–10.3)
Chloride: 109 mmol/L (ref 98–111)
Creatinine, Ser: 0.9 mg/dL (ref 0.44–1.00)
GFR calc Af Amer: >60 mL/min (ref >60)
GFR calc non Af Amer: >60 mL/min (ref >60)
Glucose, Bld: 121 mg/dL — ABNORMAL HIGH (ref 70–99)
Potassium: 3.3 mmol/L — ABNORMAL LOW (ref 3.5–5.1)
Sodium: 144 mmol/L (ref 135–145)

## 2018-12-08 LAB — VALPROIC ACID LEVEL: Valproic Acid Lvl: <10 ug/mL — ABNORMAL LOW (ref 50.0–100.0)

## 2018-12-08 LAB — MAGNESIUM: Magnesium: 2.1 mg/dL (ref 1.7–2.4)

## 2018-12-08 LAB — PHOSPHORUS: Phosphorus: 2.8 mg/dL (ref 2.5–4.6)

## 2018-12-08 LAB — CBG MONITORING, ED: Glucose-Capillary: 100 mg/dL — ABNORMAL HIGH (ref 70–99)

## 2018-12-08 MED ORDER — LORAZEPAM 2 MG/ML IJ SOLN
INTRAMUSCULAR | Status: AC
  Filled 2018-12-08: qty 1

## 2018-12-08 MED ORDER — SODIUM CHLORIDE 0.9 % IV SOLN
INTRAVENOUS | Status: DC
  Administered 2018-12-08: 08:00:00 via INTRAVENOUS

## 2018-12-08 MED ORDER — VALPROATE SODIUM 500 MG/5ML IV SOLN: 750.0000 mg | Freq: Once | INTRAVENOUS | Status: DC

## 2018-12-08 MED ORDER — LORAZEPAM 2 MG/ML IJ SOLN
1.0000 mg | Freq: Once | INTRAMUSCULAR | Status: AC
  Administered 2018-12-08: 1 mg via INTRAVENOUS

## 2018-12-08 MED ORDER — LORAZEPAM 2 MG/ML IJ SOLN
1.0000 mg | Freq: Once | INTRAMUSCULAR | Status: AC | PRN
  Administered 2018-12-08: 1 mg via INTRAVENOUS

## 2018-12-08 MED ORDER — VALPROATE SODIUM 500 MG/5ML IV SOLN
750.0000 mg | Freq: Once | INTRAVENOUS | Status: AC
  Administered 2018-12-08: 750 mg via INTRAVENOUS
  Filled 2018-12-08: qty 7.5

## 2018-12-08 MED ORDER — LEVETIRACETAM IN NACL 1500 MG/100ML IV SOLN
1500.0000 mg | Freq: Once | INTRAVENOUS | Status: AC
  Administered 2018-12-08: 1500 mg via INTRAVENOUS
  Filled 2018-12-08: qty 100

## 2018-12-08 MED ORDER — POTASSIUM CHLORIDE 10 MEQ/100ML IV SOLN
10.0000 meq | INTRAVENOUS | Status: AC
  Administered 2018-12-08 (×2): 10 meq via INTRAVENOUS
  Filled 2018-12-08 (×2): qty 100

## 2018-12-08 NOTE — Progress Notes (Signed)
Manufacturing engineer [facility]  Ms. Caraher is our hospice pt at [facility].  Facility transferred her after she had a seizure.  Spoke with ED provider surrounding goals of care.  She is a DNR/DNI, no artificial feeding.  Spoke with son Harrell Gave, he confirms "fix what you can, but make her comfortable".  Spoke of continuous EEG, transport to [facility]--he denied both.  States her seizures are a known disorder and he would like her comfortable and returned to her facility  [place], BSN, [place] (in [place]) [phone]

## 2018-12-08 NOTE — ED Provider Notes (Signed)
[facility]-EMERGENCY DEPT Provider Note   CSN: [id number] Arrival date & time: 12/08/18  0630    History   Chief Complaint Chief Complaint  Patient presents with  . Seizures    HPI Alexandria Lowe is a 63 y.o. female.     The history is provided by medical records, the nursing home and the EMS personnel. The history is limited by the condition of the patient. No language interpreter was used.  Seizures  Alexandria Lowe is a 63 y.o. female  with a PMH of seizure disorder, Alzheimer's, CKD, hypertension, hyperlipidemia who presents to the Emergency Department [facility] for [facility] after witnessed seizure by staff.  Per staff at facility, the seizure did last approximately 15 minutes.  EMS reports that she had another seizure in route lasting only about 2 minutes. Per MAR, patient on Keppra and Valproic acid at facility.   Level V caveat applies 2/2 seizure, dementia, ams   Past Medical History:  Diagnosis Date  . Alzheimer's disease (HCC) 06/17/2014  . Anemia 11/05/2015  . CKD (chronic kidney disease), stage III (HCC) 10/30/2016  . Convulsions/seizures (HCC) 06/17/2014  . Dementia (HCC)   . Depression   . Essential hypertension 11/05/2015  . Fracture of right humerus 11/05/2015  . Hyperlipidemia   . Hypertension   . Malnutrition of moderate degree 10/29/2016  . Nonverbal    "for awhile now"/sister (06/01/2015)  . Thrombocytopenia (HCC) 10/27/2016    Patient Active Problem List   Diagnosis Date Noted  . CKD (chronic kidney disease), stage III (HCC) 10/30/2016  . H/O tonic-clonic seizures 10/30/2016  . Malnutrition of moderate degree 10/29/2016  . AKI (acute kidney injury) (HCC)   . Thrombocytopenia (HCC) 10/27/2016  . Anemia 11/05/2015  . Essential hypertension 11/05/2015  . Hyperlipidemia 11/05/2015  . "walking corpse" syndrome   . Alzheimer's disease (HCC) 06/17/2014  . Dementia [facility](HCC)     Past Surgical History:  Procedure  Laterality Date  . DILATION AND CURETTAGE OF UTERUS    . VAGINAL HYSTERECTOMY       OB History   No obstetric history on file.      Home Medications    Prior to Admission medications   Medication Sig Start Date End Date Taking? Authorizing Provider  acetaminophen (TYLENOL) 500 MG tablet Take 500 mg by mouth every 4 (four) hours as needed for mild pain, moderate pain, fever or headache.    Yes [provider]  alum & mag hydroxide-simeth (MINTOX) 200-200-20 MG/5ML suspension Take 30 mLs by mouth as needed for indigestion or heartburn.    Yes [provider]  brimonidine (ALPHAGAN P) 0.1 % SOLN Place 1 drop into both eyes 2 (two) times daily.   Yes [provider]  dorzolamide (TRUSOPT) 2 % ophthalmic solution Place 1 drop into both eyes daily. 11/27/18  Yes [provider]  guaiFENesin (ROBITUSSIN) 100 MG/5ML liquid Take 200 mg by mouth every 6 (six) hours as needed for cough.   Yes [provider]  levETIRAcetam (KEPPRA) 100 MG/ML solution Take 750 mg by mouth 2 (two) times daily.   Yes [provider]  loperamide (IMODIUM) 2 MG capsule Take 2 mg by mouth as needed for diarrhea or loose stools.   Yes [provider]  magnesium hydroxide (MILK OF MAGNESIA) 400 MG/5ML suspension Take 30 mLs by mouth at bedtime as needed for mild constipation.   Yes [provider]  neomycin-bacitracin-polymyxin (NEOSPORIN) [phone] ointment Apply 1 application topically  as needed (for wound care).    Yes [provider]  Nutritional Supplements (NUTRITIONAL SHAKE PO) Take 1 Can by mouth 3 (three) times daily. Mighty Shakes   Yes [provider]  risperiDONE (RISPERDAL) 0.5 MG tablet Take 0.5 mg by mouth daily.   Yes [provider]  Skin Protectants, Misc. (MINERIN) CREA Apply 1 application topically at bedtime. Applies to whole body   Yes [provider]  Travoprost, BAK Free, (TRAVATAN Z) 0.004 %  SOLN ophthalmic solution Place 1 drop into both eyes at bedtime.   Yes [provider]  Valproate Sodium (VALPROIC ACID) 250 MG/5ML SOLN Take 12.5 mLs by mouth 2 (two) times daily.   Yes [provider]    Family History Family History  Problem Relation Age of Onset  . Cancer Mother        lung  . Hypertension Sister   . Thyroid disease Sister   . Hypertension Brother   . Diabetes Brother     Social History Social History   Tobacco Use  . Smoking status: Never Smoker  . Smokeless tobacco: Never Used  Substance Use Topics  . Alcohol use: No  . Drug use: No     Allergies   Patient has no known allergies.   Review of Systems Review of Systems  Reason unable to perform ROS: Dementia, seizure, ams.  Neurological: Positive for seizures.     Physical Exam Updated Vital Signs BP (!) 188/83   Pulse 83   Temp 98.3 F (36.8 C) (Rectal)   Resp 18   Ht 5\' 2"  (1.575 m) Comment: per faclity paperwork  Wt 46.5 kg Comment: per facility paperwork  SpO2 99%   BMI 18.75 kg/m   Physical Exam Vitals signs and nursing note reviewed.  Constitutional:      General: She is not in acute distress.    Appearance: She is well-developed.  HENT:     Head: Normocephalic and atraumatic.     Comments: No evidence of tongue biting. Neck:     Musculoskeletal: Neck supple.  Cardiovascular:     Rate and Rhythm: Normal rate and regular rhythm.     Heart sounds: Normal heart sounds. No murmur.  Pulmonary:     Effort: Pulmonary effort is normal. No respiratory distress.     Breath sounds: Normal breath sounds.  Abdominal:     General: There is no distension.     Palpations: Abdomen is soft.     Tenderness: There is no abdominal tenderness.  Skin:    General: Skin is warm and dry.  Neurological:     Mental Status: She is alert.     Comments: Nonverbal at baseline per facility.  Appears post-ictal. Not following commands or tracking across the room.      ED  Treatments / Results  Labs (all labs ordered are listed, but only abnormal results are displayed) Labs Reviewed  BASIC METABOLIC PANEL - Abnormal; Notable for the following components:      Result Value   Potassium 3.3 (*)    CO2 19 (*)    Glucose, Bld 121 (*)    Anion gap 16 (*)    All other components within normal limits  CBC WITH DIFFERENTIAL/PLATELET - Abnormal; Notable for the following components:   WBC 10.7 (*)    Hemoglobin 11.5 (*)    RDW 15.9 (*)    All other components within normal limits  URINALYSIS, ROUTINE W REFLEX MICROSCOPIC - Abnormal; Notable for the  following components:   Color, Urine STRAW (*)    All other components within normal limits  VALPROIC ACID LEVEL - Abnormal; Notable for the following components:   Valproic Acid Lvl <10 (*)    All other components within normal limits  CBG MONITORING, ED - Abnormal; Notable for the following components:   Glucose-Capillary 100 (*)    All other components within normal limits  SARS CORONAVIRUS 2 (HOSPITAL ORDER, [place] [facility])  MAGNESIUM  PHOSPHORUS    EKG None  Radiology Dg Chest 1 View  Result Date: 12/08/2018 CLINICAL DATA:  Pt coming from [place] c/o witnessed seizure by staff that lasted "15 minutes". Pt had another witnessed seizure by ems that lasted 2 minutes. Pt was post-tical afterwards. Hx of seizures. No fall, no LOC. Baseline is nonverbal and noncommunicating. Pt is hospice EXAM: CHEST  1 VIEW COMPARISON:  08/23/2016 FINDINGS: Cardiac silhouette is normal in size and configuration. No mediastinal or hilar masses or evidence of adenopathy. Clear lungs.  No pleural effusion or pneumothorax. Old fracture of the proximal right humerus, stable. IMPRESSION: No active disease. Electronically Signed   By: Lajean Manes M.D.   On: 12/08/2018 08:07   Ct Head Wo Contrast  Result Date: 12/08/2018 CLINICAL DATA:  Seizure EXAM: CT HEAD WITHOUT CONTRAST TECHNIQUE: Contiguous axial  images were obtained from the base of the skull through the vertex without intravenous contrast. COMPARISON:  October 27, 2016 head CT and brain MRI June 01, 2015 FINDINGS: Brain: There is stable diffuse atrophy. There is no intracranial mass, hemorrhage, extra-axial fluid collection, or midline shift. There is small vessel disease in the centra semiovale bilaterally, stable. No acute infarct is demonstrable on this study. Vascular: There is no hyperdense vessel. There is calcification in each carotid siphon region. Skull: Bony calvarium appears intact. Sinuses/Orbits: There is mucosal thickening in several ethmoid air cells. There is opacification in anterior right ethmoid air cell1. Orbits appear symmetric bilaterally. Other: Mastoid air cells are clear. IMPRESSION: Diffuse atrophy with stable periventricular small vessel disease. No acute infarct. No mass or hemorrhage. There are foci of arterial vascular calcification. There are areas of ethmoid sinus disease. Electronically Signed   By: Lowella Grip III M.D.   On: 12/08/2018 08:49    Procedures Procedures (including critical care time)  CRITICAL CARE Performed by: Ozella Almond Ward  Total critical care time: 35 minutes  Critical care time was exclusive of separately billable procedures and treating other patients.  Critical care was necessary to treat or prevent imminent or life-threatening deterioration.  Critical care was time spent personally by me on the following activities: development of treatment plan with patient and/or surrogate as well as nursing, discussions with consultants, evaluation of patient's response to treatment, examination of patient, obtaining history from patient or surrogate, ordering and performing treatments and interventions, ordering and review of laboratory studies, ordering and review of radiographic studies, pulse oximetry and re-evaluation of patient's condition.   Medications Ordered in ED Medications   0.9 %  sodium chloride infusion ( Intravenous Stopped 12/08/18 1159)  valproate (DEPACON) 750 mg in dextrose 5 % 50 mL IVPB (750 mg Intravenous New Bag/Given 12/08/18 1439)  LORazepam (ATIVAN) injection 1 mg (1 mg Intravenous Given 12/08/18 0726)  LORazepam (ATIVAN) injection 1 mg (1 mg Intravenous Given 12/08/18 1447)  levETIRAcetam (KEPPRA) IVPB 1500 mg/ 100 mL premix (0 mg Intravenous Stopped 12/08/18 0959)  potassium chloride 10 mEq in 100 mL IVPB (0 mEq Intravenous Stopped 12/08/18  1159)     Initial Impression / Assessment and Plan / ED Course  I have reviewed the triage vital signs and the nursing notes.  Pertinent labs & imaging results that were available during my care of the patient were reviewed by me and considered in my medical decision making (see chart for details).       Alexandria Lowe is a 63 y.o. female who presents to ED from facility for seizure activity. Patient with history of Alzheimer's dementia and seizures currently on Keppra [facility]. SHe is DNR. Patient afebrile, hemodynamically stable. Appears post-ictal upon initial exam.   7:20 AM - Notified by nursing staff that patient was having seizure. Immediately went to bedside and witnessed seizure activity which broke after 1mg  Ativan. Keppra load ordered. Seizure precautions in place. Attending, Dr. Effie ShyWentz notified and evaluating patient.   Labs reviewed. Mild leukocytosis of 10.7. K+ of 3.3 which was replenished in ED. UA without signs of infection. CXR clear. CT head without acute findings. Valproic acid level <10. Nurse at facility reports decreased appetite. She is on valproic acid sprinkles. ? If she is not getting this medication since she is not eating food with which it is mixed. Nurse informed me she will ask her nursing manager and work on improving this at facility when she returns.   Patient with no seizure-like activity for several hours while observed in ED. On re-eval, she is now laying  in fetal position very stiff and contracted. Seems to be a change in her mental status that I had seen previously. BP also much more elevated than previously as well. Given this change in her behavior, I discussed case with neurology, Dr. Wilford CornerArora. Given she is a hospice patient, he recommended reaching out to family about goals of care prior to any recommendations regarding work up.  Spoke with hospice RN Wallis BambergJennifer Woody. Hospice is to send a nurse to facility in the morning. She has spoken with Cristal Deerhristopher (son/legal guardian) as well who confirmed that patient would not want any further testing and he would like her to go back to facility if possible. Specifically does not want [facility]. I called [facility] and relayed this information to his nurse. Cristal DeerChristopher updated on plan for return to facility. She has had keppra load and depacon in ED today. Hospice RN to see patient in am with potential to add ativan if needed.    Patient seen by and discussed with Dr. Effie ShyWentz who agrees with treatment plan.   Final Clinical Impressions(s) / ED Diagnoses   Final diagnoses:  Seizure [facility](HCC)    ED Discharge Orders    None       Ward, Chase PicketJaime Pilcher, PA-C 12/08/18 1539    Mancel BaleWentz, Elliott, MD 12/12/18 1734

## 2018-12-08 NOTE — ED Provider Notes (Signed)
  Face-to-face evaluation   History: Patient arrives by EMS from her nursing care facility after a prolonged seizure for 15 minutes.  In route, by EMS, she had another seizure.  On arrival here she was seen by the provider team, felt that she had seizure activity so treated her with Ativan.    Physical exam: Patient seen by me at 7:25 AM, at that time she was obtunded, with sonorous breathing, and normal oxygen saturation.  Patient has bilateral upper extremity contractures.  Both legs are moderately stiff with passive range of motion.  Eyes are deviated to the right and pupils are equal.   Medical screening examination/treatment/procedure(s) were conducted as a shared visit with non-physician practitioner(s) and myself.  I personally evaluated the patient during the encounter    Daleen Bo, MD 12/12/18 1734

## 2018-12-08 NOTE — ED Notes (Signed)
ED Provider at bedside. 

## 2018-12-08 NOTE — Discharge Instructions (Addendum)
Please ensure patient is getting her valproic acid. Her level today was undetectable.  I have spoken with Hospice who informed me they will send a nurse out tomorrow. If they do not, please give them a call.  Return to ER as needed.

## 2018-12-08 NOTE — ED Notes (Signed)
Patient transported to CT 

## 2018-12-08 NOTE — ED Triage Notes (Signed)
Per ems: Pt coming from [place] c/o witnessed seizure by staff that lasted "15 minutes". Pt had another witnessed seizure by ems that lasted 2 minutes. Pt was post-tical afterwards. Hx of seizures. No fall, no LOC. Baseline is nonverbal and noncommunicating. Pt is hospice  When pt has a seizure, staff reports pt curls into a ball rather than tonic clonic movements   20 L forearm and NPA in place

## 2018-12-08 NOTE — ED Notes (Signed)
With student provider in the room, patient experienced a witnessed seizure that lasted approximately 1 minute. Provider aware and gave verbal order to administer 1 mg of Ativan IV (see MAR). +post ictal phase with deep snoring respirations. VSS documented in flow sheet. No incontinence at this time. Suctioned patient's oral secretions and maintained head positioning. Will continue to monitor patient.

## 2018-12-08 NOTE — ED Notes (Signed)
[facility] has been contacted regarding patient transport.  

## 2019-09-26 DEATH — deceased
